# Patient Record
Sex: Female | Born: 1955 | Race: White | Hispanic: No | Marital: Married | Smoking: Former smoker
Health system: Southern US, Community
[De-identification: ages and names within clinical notes are randomized; demographics above are authoritative.]

## PROBLEM LIST (undated history)

## (undated) DIAGNOSIS — Z789 Other specified health status: Secondary | ICD-10-CM

## (undated) DIAGNOSIS — E042 Nontoxic multinodular goiter: Secondary | ICD-10-CM

## (undated) DIAGNOSIS — R011 Cardiac murmur, unspecified: Secondary | ICD-10-CM

## (undated) DIAGNOSIS — R21 Rash and other nonspecific skin eruption: Secondary | ICD-10-CM

## (undated) DIAGNOSIS — Z5189 Encounter for other specified aftercare: Secondary | ICD-10-CM

## (undated) DIAGNOSIS — R002 Palpitations: Secondary | ICD-10-CM

## (undated) DIAGNOSIS — M3500 Sicca syndrome, unspecified: Secondary | ICD-10-CM

## (undated) DIAGNOSIS — K5720 Diverticulitis of large intestine with perforation and abscess without bleeding: Secondary | ICD-10-CM

## (undated) DIAGNOSIS — M8580 Other specified disorders of bone density and structure, unspecified site: Secondary | ICD-10-CM

## (undated) DIAGNOSIS — M1990 Unspecified osteoarthritis, unspecified site: Secondary | ICD-10-CM

## (undated) DIAGNOSIS — Z86010 Personal history of colonic polyps: Secondary | ICD-10-CM

## (undated) DIAGNOSIS — Z933 Colostomy status: Secondary | ICD-10-CM

## (undated) DIAGNOSIS — IMO0001 Reserved for inherently not codable concepts without codable children: Secondary | ICD-10-CM

## (undated) HISTORY — PX: HERNIA REPAIR: SHX51

## (undated) HISTORY — PX: COLON SURGERY: SHX602

## (undated) HISTORY — PX: OTHER SURGICAL HISTORY: SHX169

## (undated) HISTORY — DX: Nontoxic multinodular goiter: E04.2

---
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
## 1998-10-09 ENCOUNTER — Ambulatory Visit (HOSPITAL_COMMUNITY): Admission: RE | Admit: 1998-10-09 | Discharge: 1998-10-09 | Payer: Self-pay | Admitting: Family Medicine

## 1998-10-09 ENCOUNTER — Encounter: Payer: Self-pay | Admitting: Family Medicine

## 1999-12-05 ENCOUNTER — Ambulatory Visit (HOSPITAL_COMMUNITY): Admission: RE | Admit: 1999-12-05 | Discharge: 1999-12-05 | Payer: Self-pay | Admitting: Family Medicine

## 1999-12-05 ENCOUNTER — Encounter: Payer: Self-pay | Admitting: Family Medicine

## 2001-12-13 ENCOUNTER — Encounter: Payer: Self-pay | Admitting: Family Medicine

## 2001-12-13 ENCOUNTER — Ambulatory Visit (HOSPITAL_COMMUNITY): Admission: RE | Admit: 2001-12-13 | Discharge: 2001-12-13 | Payer: Self-pay | Admitting: Family Medicine

## 2002-11-10 ENCOUNTER — Encounter: Payer: Self-pay | Admitting: Family Medicine

## 2002-11-10 ENCOUNTER — Encounter: Admission: RE | Admit: 2002-11-10 | Discharge: 2002-11-10 | Payer: Self-pay | Admitting: Family Medicine

## 2004-02-05 ENCOUNTER — Encounter: Admission: RE | Admit: 2004-02-05 | Discharge: 2004-02-05 | Payer: Self-pay | Admitting: Family Medicine

## 2004-02-28 ENCOUNTER — Other Ambulatory Visit: Admission: RE | Admit: 2004-02-28 | Discharge: 2004-02-28 | Payer: Self-pay | Admitting: Family Medicine

## 2005-02-11 ENCOUNTER — Encounter: Admission: RE | Admit: 2005-02-11 | Discharge: 2005-02-11 | Payer: Self-pay | Admitting: Otolaryngology

## 2005-03-09 ENCOUNTER — Encounter: Admission: RE | Admit: 2005-03-09 | Discharge: 2005-03-09 | Payer: Self-pay | Admitting: Family Medicine

## 2005-03-12 ENCOUNTER — Other Ambulatory Visit: Admission: RE | Admit: 2005-03-12 | Discharge: 2005-03-12 | Payer: Self-pay | Admitting: Family Medicine

## 2006-06-01 LAB — HM SIGMOIDOSCOPY

## 2006-06-16 ENCOUNTER — Ambulatory Visit (HOSPITAL_COMMUNITY): Admission: RE | Admit: 2006-06-16 | Discharge: 2006-06-16 | Payer: Self-pay | Admitting: Family Medicine

## 2006-08-02 ENCOUNTER — Other Ambulatory Visit: Admission: RE | Admit: 2006-08-02 | Discharge: 2006-08-02 | Payer: Self-pay | Admitting: Family Medicine

## 2007-06-02 LAB — HM DEXA SCAN

## 2007-06-21 ENCOUNTER — Ambulatory Visit (HOSPITAL_COMMUNITY): Admission: RE | Admit: 2007-06-21 | Discharge: 2007-06-21 | Payer: Self-pay | Admitting: Family Medicine

## 2007-07-20 ENCOUNTER — Other Ambulatory Visit: Admission: RE | Admit: 2007-07-20 | Discharge: 2007-07-20 | Payer: Self-pay | Admitting: Family Medicine

## 2007-12-16 ENCOUNTER — Encounter: Admission: RE | Admit: 2007-12-16 | Discharge: 2007-12-16 | Payer: Self-pay | Admitting: Family Medicine

## 2008-07-11 IMAGING — MG MM DIGITAL SCREENING BILAT
5 series · 5 of 5 positions shown · non-contrast
Comparison: none

DG SCREEN MAMMOGRAM BILATERAL
Bilateral CC and MLO view(s) were taken.

DIGITAL SCREENING MAMMOGRAM WITH CAD:
The breast tissue is heterogeneously dense.  No masses or malignant type calcifications are 
identified.  Compared with prior studies.

[R CC (1 of 2)]
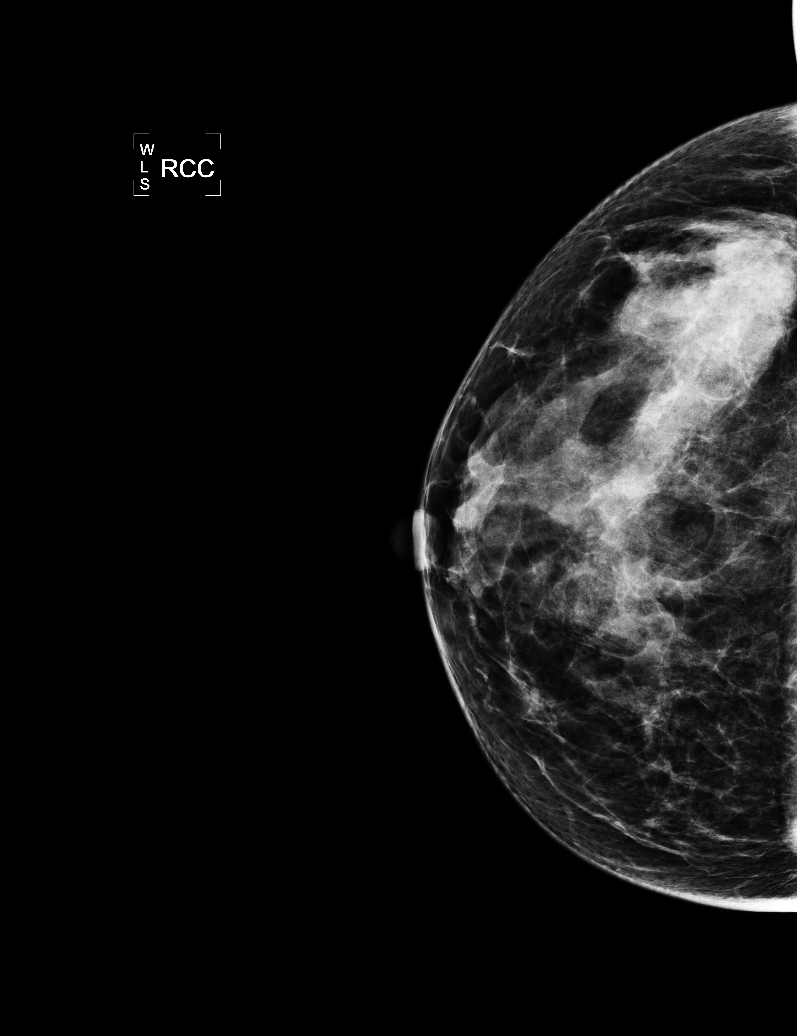

[R MLO]
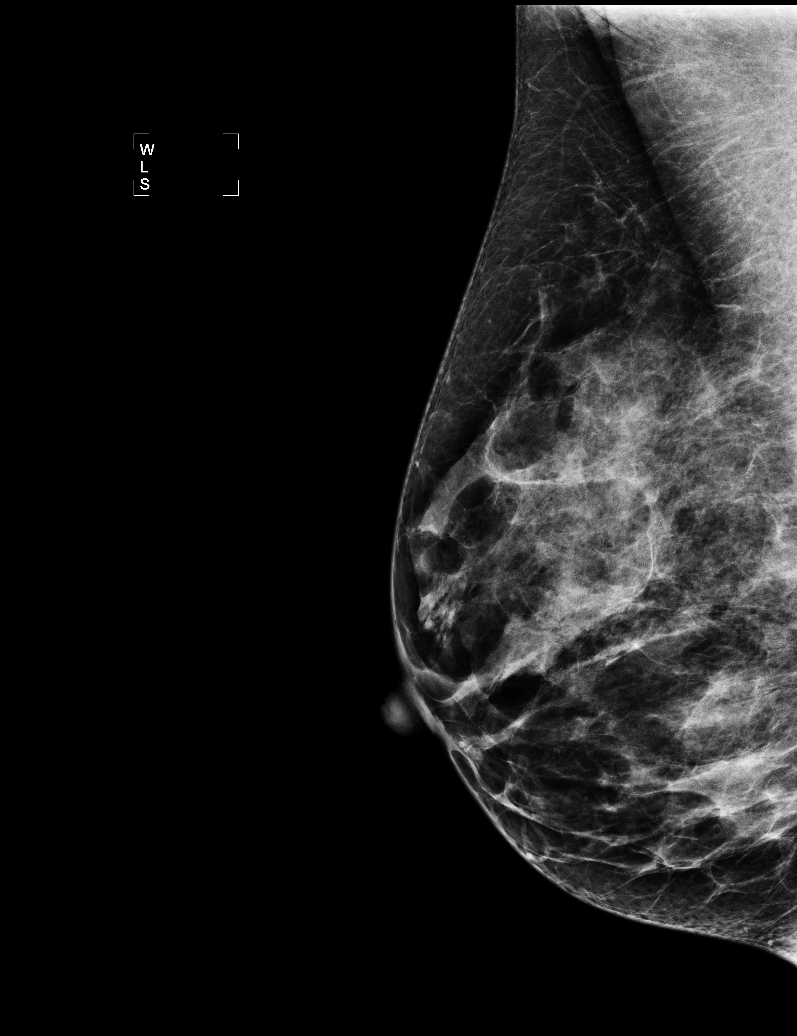

[L CC]
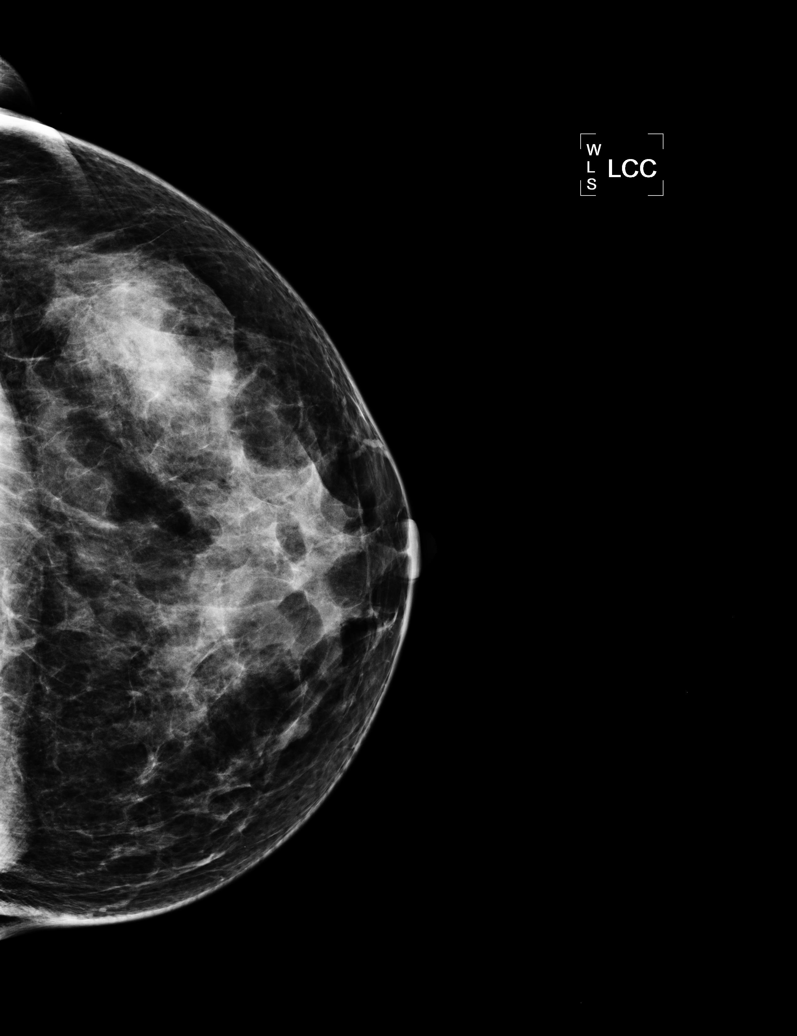

[L MLO]
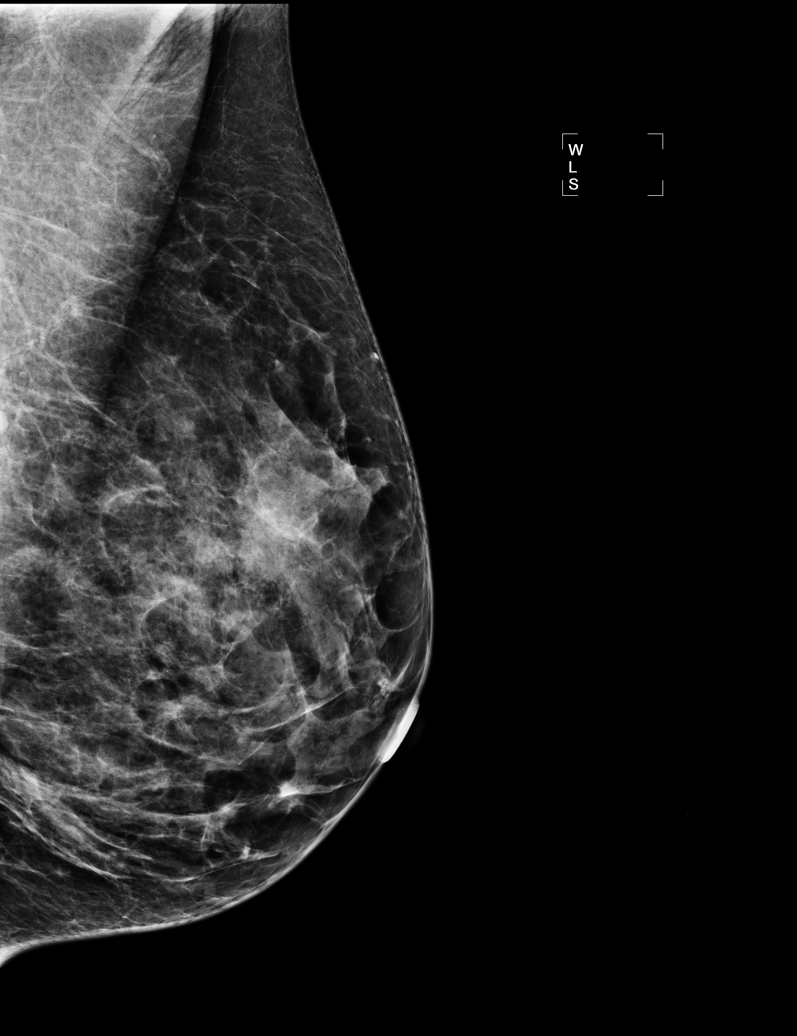

[R CC (2 of 2)]
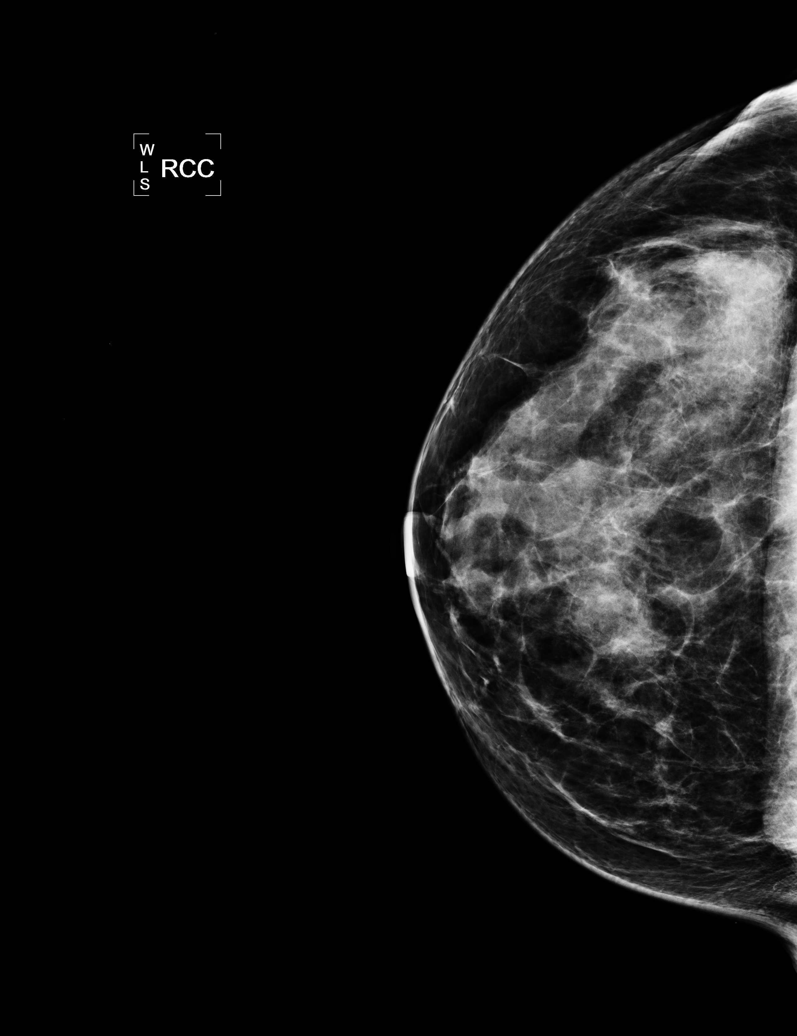

[5 of 5 positions shown; findings below may reference images not displayed]

IMPRESSION: No specific mammographic evidence of malignancy.  Next screening mammogram is recommended in one 
year.

ASSESSMENT: Negative - BI-RADS 1

Screening mammogram in 1 year.
ANALYZED BY COMPUTER AIDED DETECTION. , THIS PROCEDURE WAS A DIGITAL MAMMOGRAM.

## 2008-08-02 ENCOUNTER — Ambulatory Visit (HOSPITAL_COMMUNITY): Admission: RE | Admit: 2008-08-02 | Discharge: 2008-08-02 | Payer: Self-pay | Admitting: Family Medicine

## 2008-08-06 ENCOUNTER — Other Ambulatory Visit: Admission: RE | Admit: 2008-08-06 | Discharge: 2008-08-06 | Payer: Self-pay | Admitting: Family Medicine

## 2009-08-08 ENCOUNTER — Other Ambulatory Visit: Admission: RE | Admit: 2009-08-08 | Discharge: 2009-08-08 | Payer: Self-pay | Admitting: Family Medicine

## 2009-08-22 ENCOUNTER — Ambulatory Visit (HOSPITAL_COMMUNITY): Admission: RE | Admit: 2009-08-22 | Discharge: 2009-08-22 | Payer: Self-pay | Admitting: Family Medicine

## 2009-11-26 ENCOUNTER — Encounter: Admission: RE | Admit: 2009-11-26 | Discharge: 2009-11-26 | Payer: Self-pay | Admitting: Family Medicine

## 2010-08-04 ENCOUNTER — Other Ambulatory Visit (HOSPITAL_COMMUNITY): Payer: Self-pay | Admitting: Family Medicine

## 2010-08-04 DIAGNOSIS — Z1231 Encounter for screening mammogram for malignant neoplasm of breast: Secondary | ICD-10-CM

## 2010-08-14 ENCOUNTER — Other Ambulatory Visit (HOSPITAL_COMMUNITY)
Admission: RE | Admit: 2010-08-14 | Discharge: 2010-08-14 | Disposition: A | Discharge status: Home / Self Care | Payer: BC Managed Care – PPO | Source: Ambulatory Visit | Attending: Family Medicine | Admitting: Family Medicine

## 2010-08-14 ENCOUNTER — Other Ambulatory Visit: Payer: Self-pay | Admitting: Family Medicine

## 2010-08-14 DIAGNOSIS — Z124 Encounter for screening for malignant neoplasm of cervix: Secondary | ICD-10-CM | POA: Insufficient documentation

## 2010-08-14 DIAGNOSIS — Z1159 Encounter for screening for other viral diseases: Secondary | ICD-10-CM | POA: Insufficient documentation

## 2010-08-25 ENCOUNTER — Ambulatory Visit (HOSPITAL_COMMUNITY)
Admission: RE | Admit: 2010-08-25 | Discharge: 2010-08-25 | Disposition: A | Discharge status: Home / Self Care | Payer: BC Managed Care – PPO | Source: Ambulatory Visit | Attending: Family Medicine | Admitting: Family Medicine

## 2010-08-25 DIAGNOSIS — Z1231 Encounter for screening mammogram for malignant neoplasm of breast: Secondary | ICD-10-CM | POA: Insufficient documentation

## 2011-04-02 DIAGNOSIS — Z933 Colostomy status: Secondary | ICD-10-CM

## 2011-04-02 HISTORY — DX: Colostomy status: Z93.3

## 2011-04-16 ENCOUNTER — Inpatient Hospital Stay (HOSPITAL_COMMUNITY)
Admission: EM | Admit: 2011-04-16 | Discharge: 2011-04-21 | Disposition: A | Discharge status: Home / Self Care | Payer: BC Managed Care – PPO | Source: Home / Self Care

## 2011-04-16 ENCOUNTER — Emergency Department (INDEPENDENT_AMBULATORY_CARE_PROVIDER_SITE_OTHER): Payer: BC Managed Care – PPO

## 2011-04-16 ENCOUNTER — Encounter: Payer: Self-pay | Admitting: *Deleted

## 2011-04-16 DIAGNOSIS — J45909 Unspecified asthma, uncomplicated: Secondary | ICD-10-CM

## 2011-04-16 DIAGNOSIS — K5732 Diverticulitis of large intestine without perforation or abscess without bleeding: Secondary | ICD-10-CM | POA: Diagnosis present

## 2011-04-16 DIAGNOSIS — Z860100 Personal history of colon polyps, unspecified: Secondary | ICD-10-CM

## 2011-04-16 DIAGNOSIS — K668 Other specified disorders of peritoneum: Secondary | ICD-10-CM

## 2011-04-16 DIAGNOSIS — K5730 Diverticulosis of large intestine without perforation or abscess without bleeding: Secondary | ICD-10-CM

## 2011-04-16 DIAGNOSIS — M3500 Sicca syndrome, unspecified: Secondary | ICD-10-CM | POA: Diagnosis present

## 2011-04-16 DIAGNOSIS — R109 Unspecified abdominal pain: Secondary | ICD-10-CM

## 2011-04-16 DIAGNOSIS — K630 Abscess of intestine: Secondary | ICD-10-CM | POA: Diagnosis present

## 2011-04-16 HISTORY — DX: Cardiac murmur, unspecified: R01.1

## 2011-04-16 LAB — CBC
Hemoglobin: 13.2 g/dL (ref 12.0–15.0)
MCV: 91.5 fL (ref 78.0–100.0)
Platelets: 232 10*3/uL (ref 150–400)
RDW: 12.9 % (ref 11.5–15.5)

## 2011-04-16 LAB — COMPREHENSIVE METABOLIC PANEL
ALT: 7 U/L (ref 0–35)
Alkaline Phosphatase: 40 U/L (ref 39–117)
GFR calc Af Amer: >90 mL/min (ref >90)
GFR calc non Af Amer: >90 mL/min (ref >90)
Glucose, Bld: 115 mg/dL — ABNORMAL HIGH (ref 70–99)

## 2011-04-16 LAB — URINALYSIS, ROUTINE W REFLEX MICROSCOPIC
Bilirubin Urine: NEGATIVE
Ketones, ur: 15 mg/dL — AB
Leukocytes, UA: NEGATIVE
Nitrite: NEGATIVE
Protein, ur: NEGATIVE mg/dL
Specific Gravity, Urine: 1.011 (ref 1.005–1.030)
Urobilinogen, UA: 0.2 mg/dL (ref 0.0–1.0)

## 2011-04-16 LAB — LIPASE, BLOOD: Lipase: 22 U/L (ref 11–59)

## 2011-04-16 LAB — URINE MICROSCOPIC-ADD ON

## 2011-04-16 MED ORDER — HYDROMORPHONE HCL PF 1 MG/ML IJ SOLN
1.0000 mg | Freq: Once | INTRAMUSCULAR | Status: AC
  Administered 2011-04-16: 1 mg via INTRAVENOUS
  Filled 2011-04-16: qty 1

## 2011-04-16 MED ORDER — ONDANSETRON HCL 4 MG/2ML IJ SOLN
4.0000 mg | Freq: Once | INTRAMUSCULAR | Status: AC
  Administered 2011-04-16: 4 mg via INTRAVENOUS
  Filled 2011-04-16: qty 2

## 2011-04-16 MED ORDER — IOHEXOL 300 MG/ML  SOLN
100.0000 mL | Freq: Once | INTRAMUSCULAR | Status: AC | PRN
  Administered 2011-04-16: 100 mL via INTRAVENOUS

## 2011-04-16 NOTE — ED Provider Notes (Addendum)
History     CSN: [id number] Arrival date & time: 04/16/2011 10:21 PM   First MD Initiated Contact with Patient 04/16/11 2232      Chief Complaint  Patient presents with  . Abdominal Pain    (Consider location/radiation/quality/duration/timing/severity/associated sxs/prior treatment) Patient is a 55 y.o. female presenting with abdominal pain. The history is provided by the patient and the spouse.  Abdominal Pain The primary symptoms of the illness include abdominal pain.   patient here with abdominal pain described as diffuse initially and now worse in her right lower quadrant. Symptoms started 2 days ago described as sharp and stabbing. Worse with movement better with rest. Was seen by her primary care physician today had blood work performed which showed an elevated white blood cell count. Patient seen here for further evaluation. Denies any urinary symptoms. Patient similar symptoms several weeks ago resolved spontaneously. Denies any current vaginal bleeding or abnormal discharge  History reviewed. No pertinent past medical history.  Past Surgical History  Procedure Date  . Hernia repair     History reviewed. No pertinent family history.  History  Substance Use Topics  . Smoking status: Never Smoker   . Smokeless tobacco: Not on file  . Alcohol Use: No    OB History    Grav Para Term Preterm Abortions TAB SAB Ect Mult Living                  Review of Systems  Gastrointestinal: Positive for abdominal pain.  All other systems reviewed and are negative.    Allergies  Codeine  Home Medications   Current Outpatient Rx  Name Route Sig Dispense Refill  . VITAMIN D 2000 UNITS PO TABS Oral Take 2,000 Units by mouth daily.      . CYCLOSPORINE 0.05 % OP EMUL Both Eyes Place 1 drop into both eyes 2 (two) times daily.      [name] FLUTICASONE PROPIONATE 50 MCG/ACT NA SUSP Nasal Place 1 spray into the nose daily.      Carma Leaven M PLUS PO TABS Oral Take 1 tablet by mouth daily.       [name] NAPROXEN SODIUM 220 MG PO TABS Oral Take 220-440 mg by mouth once as needed. For pain       BP 108/87  Pulse 101  Temp(Src) 99.9 F (37.7 C) (Oral)  Resp 16  Ht 5\' 7"  (1.702 m)  Wt 146 lb (66.225 kg)  BMI 22.87 kg/m2  SpO2 99%  LMP 03/23/2011  Physical Exam  Nursing note and vitals reviewed. Constitutional: She is oriented to person, place, and time. She appears well-developed and well-nourished.  Non-toxic appearance. No distress.  HENT:  Head: Normocephalic and atraumatic.  Eyes: Conjunctivae and EOM are normal. Pupils are equal, round, and reactive to light.  Neck: Normal range of motion. Neck supple. No tracheal deviation present.  Cardiovascular: Regular rhythm and normal heart sounds.  Tachycardia present.  Exam reveals no gallop.   No murmur heard. Pulmonary/Chest: Effort normal and breath sounds normal. No stridor. No respiratory distress. She has no wheezes.  Abdominal: Soft. Normal appearance and bowel sounds are normal. She exhibits no distension. There is tenderness in the right lower quadrant. There is no rigidity, no rebound, no guarding and no CVA tenderness. No hernia.  Musculoskeletal: Normal range of motion. She exhibits no edema and no tenderness.  Neurological: She is alert and oriented to person, place, and time. She has normal strength. No cranial nerve deficit or sensory deficit. GCS  eye subscore is 4. GCS verbal subscore is 5. GCS motor subscore is 6.  Skin: Skin is warm and dry.  Psychiatric: She has a normal mood and affect. Her speech is normal and behavior is normal.    ED Course  Procedures (including critical care time)  Labs Reviewed  URINALYSIS, ROUTINE W REFLEX MICROSCOPIC - Abnormal; Notable for the following:    Hgb urine dipstick TRACE (*)    Ketones, ur 15 (*)    All other components within normal limits  CBC - Abnormal; Notable for the following:    WBC 14.1 (*)    All other components within normal limits  URINE MICROSCOPIC-ADD  ON  COMPREHENSIVE METABOLIC PANEL  LIPASE, BLOOD   No results found.   No diagnosis found.    MDM  Patient to be given IV fluids and pain medications here. Patient signed out to Dr. Norlene Campbell for disposition of patient        Toy Baker, MD 04/16/11 2245  11:51 PM Results for orders placed during the hospital encounter of 04/16/11  URINALYSIS, ROUTINE W REFLEX MICROSCOPIC      Component Value Range   Color, Urine YELLOW  YELLOW    Appearance CLEAR  CLEAR    Specific Gravity, Urine 1.011  1.005 - 1.030    pH 7.0  5.0 - 8.0    Glucose, UA NEGATIVE  NEGATIVE (mg/dL)   Hgb urine dipstick TRACE (*) NEGATIVE    Bilirubin Urine NEGATIVE  NEGATIVE    Ketones, ur 15 (*) NEGATIVE (mg/dL)   Protein, ur NEGATIVE  NEGATIVE (mg/dL)   Urobilinogen, UA 0.2  0.0 - 1.0 (mg/dL)   Nitrite NEGATIVE  NEGATIVE    Leukocytes, UA NEGATIVE  NEGATIVE   CBC      Component Value Range   WBC 14.1 (*) 4.0 - 10.5 (K/uL)   RBC 4.26  3.87 - 5.11 (MIL/uL)   Hemoglobin 13.2  12.0 - 15.0 (g/dL)   HCT 62.1  30.8 - 65.7 (%)   MCV 91.5  78.0 - 100.0 (fL)   MCH 31.0  26.0 - 34.0 (pg)   MCHC 33.8  30.0 - 36.0 (g/dL)   RDW 84.6  96.2 - 95.2 (%)   Platelets 232  150 - 400 (K/uL)  COMPREHENSIVE METABOLIC PANEL      Component Value Range   Sodium 138  135 - 145 (mEq/L)   Potassium 4.0  3.5 - 5.1 (mEq/L)   Chloride 102  96 - 112 (mEq/L)   CO2 25  19 - 32 (mEq/L)   Glucose, Bld 115 (*) 70 - 99 (mg/dL)   BUN 10  6 - 23 (mg/dL)   Creatinine, Ser 8.41  0.50 - 1.10 (mg/dL)   Calcium 9.8  8.4 - 32.4 (mg/dL)   Total Protein 8.0  6.0 - 8.3 (g/dL)   Albumin 3.9  3.5 - 5.2 (g/dL)   AST 11  0 - 37 (U/L)   ALT 7  0 - 35 (U/L)   Alkaline Phosphatase 40  39 - 117 (U/L)   Total Bilirubin 0.8  0.3 - 1.2 (mg/dL)   GFR calc non Af Amer >90  >90 (mL/min)   GFR calc Af Amer >90  >90 (mL/min)  URINE MICROSCOPIC-ADD ON      Component Value Range   Squamous Epithelial / LPF RARE  RARE    RBC / HPF 0-2  <3  (RBC/hpf)   Bacteria, UA RARE  RARE   LIPASE, BLOOD  Component Value Range   Lipase 22  11 - 59 (U/L)   Ct Abdomen Pelvis W Contrast  04/16/2011  *RADIOLOGY REPORT*  Clinical Data: Mid abdominal pain.  CT ABDOMEN AND PELVIS WITH CONTRAST  Technique:  Multidetector CT imaging of the abdomen and pelvis was performed following the standard protocol during bolus administration of intravenous contrast.  Contrast: OMNIPAQUE IOHEXOL 300 MG/ML IV SOLN  Comparison: None.  Findings: Minimal bibasilar atelectasis is seen.  A significant amount of free air is noted tracking along the diaphragm, with scattered foci of free air noted about the liver, and small and large bowel loops throughout the abdomen and pelvis.  The location of the patient's bowel perforation is not well characterized.  There is soft tissue inflammation at the right lower quadrant, with inflammatory change involving the distal ileum and the mid sigmoid colon.  More mild diffuse mesenteric haziness is noted.  There is no evidence for appendicitis; the appendix remains normal in caliber.  A 1.1 cm hypodensity along the periphery of the hepatic dome may reflect a small cyst, but is difficult to fully characterize.  The liver is otherwise unremarkable in appearance.  The spleen is within normal limits.  The gallbladder is normal in appearance. The pancreas and adrenal glands are unremarkable.  A tiny 0.7 cm hypodensity along the periphery of the right kidney may reflect a small cyst.  The kidneys are otherwise unremarkable in appearance.  There is incomplete rotation of the right kidney. There is no evidence of hydronephrosis.  No renal or ureteral stones are seen.  No free fluid is identified.  Aside from the soft tissue inflammation about the distal ileum, the small bowel is grossly unremarkable.  The stomach is within normal limits.  No acute vascular abnormalities are seen.  Scattered diverticulosis is noted along the sigmoid colon, without  evidence of diverticulitis.  Aside from the inflammation along the mid sigmoid colon, the colon is unremarkable in appearance.  The bladder is mildly distended; mild soft tissue inflammation about the bladder is nonspecific in appearance.  The uterus is mildly prominent, with suspicion for fibroids. The ovaries are relatively symmetric; no suspicious adnexal masses are seen.  No inguinal lymphadenopathy is seen.  No acute osseous abnormalities are identified.  A large bone island is noted within vertebral body L5.  IMPRESSION:  1.  Significant amount of free air noted tracking along the diaphragm, with scattered foci of free air about the liver, and about small and large bowel loops throughout the abdomen and pelvis. 2.  The location of the bowel perforation is not well characterized; soft tissue inflammation noted at the right lower quadrant, with mild inflammatory change involving the distal ileum and mid sigmoid colon.  No free fluid seen. 3.  Likely small hepatic and renal cysts noted. 4.  Diverticulosis noted along the sigmoid colon, without evidence of diverticulitis. 5.  Likely fibroid uterus noted.  Critical Value/emergent results were called by telephone at the time of interpretation on 04/16/2011  at 11:45 p.m.  to  Dr. Marisa Severin, who verbally acknowledged these results.  Original Report Authenticated By: Tonia Ghent, M.D.     CT scan with free air around distall ileum.  Patient re-evaluated.  Awaiting call back from surgeon.  Olivia Mackie, MD 04/17/11 [phone]

## 2011-04-16 NOTE — ED Notes (Signed)
Pt c/o  Lower abd pain , denies n/v. Sent here by PMD for elevated wc.  Sent here for ct scan

## 2011-04-17 ENCOUNTER — Encounter (HOSPITAL_COMMUNITY): Payer: Self-pay | Admitting: *Deleted

## 2011-04-17 DIAGNOSIS — J45909 Unspecified asthma, uncomplicated: Secondary | ICD-10-CM

## 2011-04-17 DIAGNOSIS — M3500 Sicca syndrome, unspecified: Secondary | ICD-10-CM

## 2011-04-17 LAB — CBC
HCT: 34.3 % — ABNORMAL LOW (ref 36.0–46.0)
MCH: 31.8 pg (ref 26.0–34.0)
MCV: 91 fL (ref 78.0–100.0)
RDW: 13 % (ref 11.5–15.5)
WBC: 10.4 10*3/uL (ref 4.0–10.5)

## 2011-04-17 LAB — DIFFERENTIAL
Basophils Absolute: 0 10*3/uL (ref 0.0–0.1)
Eosinophils Absolute: 0.1 10*3/uL (ref 0.0–0.7)
Eosinophils Relative: 1 % (ref 0–5)
Lymphocytes Relative: 10 % — ABNORMAL LOW (ref 12–46)
Monocytes Absolute: 0.6 10*3/uL (ref 0.1–1.0)

## 2011-04-17 LAB — BASIC METABOLIC PANEL
CO2: 23 mEq/L (ref 19–32)
Calcium: 8.9 mg/dL (ref 8.4–10.5)
Creatinine, Ser: 0.81 mg/dL (ref 0.50–1.10)
GFR calc non Af Amer: 81 mL/min — ABNORMAL LOW (ref >90)
Glucose, Bld: 136 mg/dL — ABNORMAL HIGH (ref 70–99)

## 2011-04-17 MED ORDER — MORPHINE SULFATE 2 MG/ML IJ SOLN
1.0000 mg | INTRAMUSCULAR | Status: DC | PRN
  Administered 2011-04-17 (×2): 2 mg via INTRAVENOUS
  Administered 2011-04-17: 3 mg via INTRAVENOUS
  Administered 2011-04-18: 2 mg via INTRAVENOUS
  Filled 2011-04-17: qty 1
  Filled 2011-04-17: qty 2
  Filled 2011-04-17 (×2): qty 1

## 2011-04-17 MED ORDER — ONDANSETRON HCL 4 MG/2ML IJ SOLN
4.0000 mg | Freq: Four times a day (QID) | INTRAMUSCULAR | Status: DC | PRN
  Administered 2011-04-17: 4 mg via INTRAVENOUS
  Filled 2011-04-17: qty 2

## 2011-04-17 MED ORDER — KCL IN DEXTROSE-NACL 20-5-0.45 MEQ/L-%-% IV SOLN: Freq: Once | INTRAVENOUS | Status: DC

## 2011-04-17 MED ORDER — PIPERACILLIN-TAZOBACTAM 3.375 G IVPB
3.3750 g | Freq: Three times a day (TID) | INTRAVENOUS | Status: DC
  Administered 2011-04-18 – 2011-04-21 (×11): 3.375 g via INTRAVENOUS
  Filled 2011-04-17 (×15): qty 50

## 2011-04-17 MED ORDER — KCL IN DEXTROSE-NACL 20-5-0.45 MEQ/L-%-% IV SOLN
Freq: Once | INTRAVENOUS | Status: AC
  Administered 2011-04-17: 03:00:00 via INTRAVENOUS
  Filled 2011-04-17: qty 1000

## 2011-04-17 MED ORDER — PIPERACILLIN-TAZOBACTAM 3.375 G IVPB 30 MIN
3.3750 g | INTRAVENOUS | Status: AC
  Administered 2011-04-17: 3.375 g via INTRAVENOUS
  Filled 2011-04-17: qty 50

## 2011-04-17 MED ORDER — KCL IN DEXTROSE-NACL 20-5-0.45 MEQ/L-%-% IV SOLN
INTRAVENOUS | Status: AC
  Administered 2011-04-17: 1000 mL via INTRAVENOUS
  Administered 2011-04-17: 20:00:00 via INTRAVENOUS
  Administered 2011-04-17: 1000 mL via INTRAVENOUS
  Administered 2011-04-18 (×2): via INTRAVENOUS
  Administered 2011-04-18: 1000 mL via INTRAVENOUS
  Filled 2011-04-17 (×9): qty 1000

## 2011-04-17 MED ORDER — PIPERACILLIN-TAZOBACTAM 3.375 G IVPB: 3.3750 g | Freq: Four times a day (QID) | INTRAVENOUS | Status: DC

## 2011-04-17 NOTE — Progress Notes (Signed)
Patient seen and I agree with management plan.  Patient's pain may be better this am.  She understands and accepts treatment strategy.

## 2011-04-17 NOTE — ED Notes (Signed)
CALLED TO GIVE REPORT NURSE UNAVAILABLE WILL CALL BACK.  

## 2011-04-17 NOTE — Progress Notes (Signed)
Subjective: Still having some discomfort, better with pain meds.    Objective: Vital signs in last 24 hours: Temp:  [98.9 F (37.2 C)-99.9 F (37.7 C)] 99.8 F (37.7 C) (11/16 0404) Pulse Rate:  [78-101] 96  (11/16 0404) Resp:  [16] 16  (11/16 0404) BP: (99-116)/(55-87) 107/66 mmHg (11/16 0404) SpO2:  [96 %-99 %] 98 % (11/16 0404) Weight:  [66.225 kg (146 lb)] 146 lb (66.225 kg) (11/16 0404) Last BM Date: 04/15/11  Intake/Output from previous day: 11/15 0701 - 11/16 0700 In: 150 [I.V.:150] Out: -  Intake/Output this shift:    General appearance: alert, cooperative and no distress GI: Abd soft, few bowel sound, not distended.   Lab Results:   [facility] 04/17/11 0620 04/16/11 2222  WBC 10.4 14.1*  HGB 12.0 13.2  HCT 34.3* 39.0  PLT 193 232    BMET  Basename 04/17/11 0620 04/16/11 2222  NA 133* 138  K 3.5 4.0  CL 101 102  CO2 23 25  GLUCOSE 136* 115*  BUN 10 10  CREATININE 0.81 0.70  CALCIUM 8.9 9.8   PT/INR No results found for this basename: LABPROT:2,INR:2 in the last 72 hours   Studies/Results: Ct Abdomen Pelvis W Contrast  04/17/2011  **ADDENDUM** CREATED: 04/17/2011 04:53:47  On further review, the degree of soft tissue inflammation about the mid sigmoid colon may reflect acute diverticulitis.  On coronal images, there is question of minimal air tracking from the mid sigmoid colon (image 28 of 70), and this could possibly reflect the site of perforation, though the amount of free intra-abdominal air is unusual for a ruptured diverticulitis.  Surrounding soft tissue inflammation is noted at the right lower quadrant.  The lack of free fluid suggests against an ileal rupture, while the lack of contrast extravasation suggests against a bowel perforation in the proximal small bowel.  **END ADDENDUM** SIGNED BY: Tonia Ghent, M.D.   04/17/2011  *RADIOLOGY REPORT*  Clinical Data: Mid abdominal pain.  CT ABDOMEN AND PELVIS WITH CONTRAST  Technique:   Multidetector CT imaging of the abdomen and pelvis was performed following the standard protocol during bolus administration of intravenous contrast.  Contrast: OMNIPAQUE IOHEXOL 300 MG/ML IV SOLN  Comparison: None.  Findings: Minimal bibasilar atelectasis is seen.  A significant amount of free air is noted tracking along the diaphragm, with scattered foci of free air noted about the liver, and small and large bowel loops throughout the abdomen and pelvis.  The location of the patient's bowel perforation is not well characterized.  There is soft tissue inflammation at the right lower quadrant, with inflammatory change involving the distal ileum and the mid sigmoid colon.  More mild diffuse mesenteric haziness is noted.  There is no evidence for appendicitis; the appendix remains normal in caliber.  A 1.1 cm hypodensity along the periphery of the hepatic dome may reflect a small cyst, but is difficult to fully characterize.  The liver is otherwise unremarkable in appearance.  The spleen is within normal limits.  The gallbladder is normal in appearance. The pancreas and adrenal glands are unremarkable.  A tiny 0.7 cm hypodensity along the periphery of the right kidney may reflect a small cyst.  The kidneys are otherwise unremarkable in appearance.  There is incomplete rotation of the right kidney. There is no evidence of hydronephrosis.  No renal or ureteral stones are seen.  No free fluid is identified.  Aside from the soft tissue inflammation about the distal ileum, the small bowel is grossly  unremarkable.  The stomach is within normal limits.  No acute vascular abnormalities are seen.  Scattered diverticulosis is noted along the sigmoid colon, without evidence of diverticulitis.  Aside from the inflammation along the mid sigmoid colon, the colon is unremarkable in appearance.  The bladder is mildly distended; mild soft tissue inflammation about the bladder is nonspecific in appearance.  The uterus is mildly  prominent, with suspicion for fibroids. The ovaries are relatively symmetric; no suspicious adnexal masses are seen.  No inguinal lymphadenopathy is seen.  No acute osseous abnormalities are identified.  A large bone island is noted within vertebral body L5.  IMPRESSION:  1.  Significant amount of free air noted tracking along the diaphragm, with scattered foci of free air about the liver, and about small and large bowel loops throughout the abdomen and pelvis. 2.  The location of the bowel perforation is not well characterized; soft tissue inflammation noted at the right lower quadrant, with mild inflammatory change involving the distal ileum and mid sigmoid colon.  No free fluid seen. 3.  Likely small hepatic and renal cysts noted. 4.  Diverticulosis noted along the sigmoid colon, without evidence of diverticulitis. 5.  Likely fibroid uterus noted.  Critical Value/emergent results were called by telephone at the time of interpretation on 04/16/2011  at 11:45 p.m.  to  Dr. Marisa Severin, who verbally acknowledged these results. Original Report Authenticated By: Tonia Ghent, M.D.    Anti-infectives: Anti-infectives     Start     Dose/Rate Route Frequency Ordered Stop   04/17/11 0230   piperacillin-tazobactam (ZOSYN) IVPB 3.375 g  Status:  Discontinued        3.375 g 12.5 mL/hr over 240 Minutes Intravenous 4 times per day 04/17/11 0217 04/17/11 0221   04/17/11 0230  piperacillin-tazobactam (ZOSYN) IVPB 3.375 g       3.375 g 100 mL/hr over 30 Minutes Intravenous To Emergency Dept 04/17/11 0221 04/17/11 0313         Current Facility-Administered Medications  Medication Dose Route Frequency Provider Last Rate Last Dose  . dextrose 5 % and 0.45 % NaCl with KCl 20 mEq/L infusion   Intravenous Once Kandis Cocking, MD 200 mL/hr at 04/17/11 0244    . dextrose 5 % and 0.45 % NaCl with KCl 20 mEq/L infusion   Intravenous Continuous Drema Halon, MD 200 mL/hr at 04/17/11 0545    . HYDROmorphone  (DILAUDID) injection 1 mg  1 mg Intravenous Once Toy Baker, MD   1 mg at 04/16/11 2317  . iohexol (OMNIPAQUE) 300 MG/ML injection 100 mL  100 mL Intravenous Once PRN Medication Radiologist   100 mL at 04/16/11 2338  . morphine 2 MG/ML injection 1-3 mg  1-3 mg Intravenous Q2H PRN Kandis Cocking, MD   3 mg at 04/17/11 0251  . ondansetron (ZOFRAN) injection 4 mg  4 mg Intravenous Once Toy Baker, MD   4 mg at 04/16/11 2317  . ondansetron (ZOFRAN) injection 4 mg  4 mg Intravenous Q6H PRN Kandis Cocking, MD   4 mg at 04/17/11 0252  . piperacillin-tazobactam (ZOSYN) IVPB 3.375 g  3.375 g Intravenous To ER Jacquenette Shone Crowford Darlina Guys., PHARMD   3.375 g at 04/17/11 0243  . DISCONTD: dextrose 5 % and 0.45 % NaCl with KCl 20 mEq/L infusion   Intravenous Once Drema Halon, MD      . DISCONTD: piperacillin-tazobactam (ZOSYN) IVPB 3.375 g  3.375 g Intravenous Q6H Kandis Cocking,  MD        Assessment/Plan 1.Free Air At the diaphragm, diverticulosis, but not diverticulitis.  2Probable diverticular perforation. Patient Active Problem List  Diagnoses  . Sjoegren syndrome  . Asthma   3.There is no problem list on file for this patient. Plan: On antibiotics, Dr. Daphine Deutscher to review studies, and will see shortly.   LOS: 1 day    Kanon Novosel 04/17/2011

## 2011-04-17 NOTE — ED Notes (Signed)
EAV:[id number]> Expected date:04/17/11<BR> Expected time: 1:09 AM<BR> Means of arrival:Ambulance<BR> Comments:<BR> abd pain, surgery to consult

## 2011-04-17 NOTE — Progress Notes (Signed)
Chart reviewed and UR completed. 

## 2011-04-17 NOTE — ED Notes (Signed)
Report given to [employer] charge at [facility]

## 2011-04-17 NOTE — H&P (Addendum)
[facility]  Ovidio Kin, MD,  FACS [address]  [address] [place], [place]    [address] Phone:  [phone] FAX:  [phone]  ASSESSMENT AND PLAN: 1.  Pneumoperitoneum  Probable diverticular source.  On CT scan, there are some inflammatory changes in her mid sigmoid colon.  Patient does not look sick enough or tender enough to have a gastro-duodenal perf.  I have two operations to go.    Will admit, place on antibiotics, keep NPO, give IVF and re-evaluate in 2 to 4 hours.  Plan serial exams.  I discussed with the patient and her husband about the options for medical treatment vs possible surgery.  2.  Sjogren's syndrome, mild.  She did see Dr. Oleta Gentry. 3.  Asthma, mild 4.  Irregular periods, on BCP. 5.  Remote history of right thigh myxoma. 6.  Diverticulosis on CT. 7.  Probable uterine fibroids.   Chief Complaint  Patient presents with  . Abdominal Pain   REFERRING PHYSICIAN:   Patient sent from [facility] via [facility].  HISTORY OF PRESENT ILLNESS: Annette Gentry is a 55 y.o. (DOB: 04/22/1956)  white female whose primary care physician is Dr. Rulon Abide  (saw Dr. Laurine Blazer today) and comes to me today for pneumoperitoneum.  Had abdominal pain which began yesterday.  She had a similar episode 7 weeks ago which lasted 3 days, but was not as bad, and she related it to her period.  She had increasing abdominal pain over the last 24 hours, no nausea or vomiting.  She had a colonoscopy in 2009 which was negative, except she thinks she remembers diverticula being mentioned.    GI history:  No history of stomach disease.  No history of liver disease.  No history of gall bladder disease.  No history of pancreas disease.  No history of colon disease   History reviewed. No pertinent past medical history.    Past Surgical History  Procedure Date  . Hernia repair       Current Facility-Administered Medications  Medication Dose Route  Frequency Provider Last Rate Last Dose  . HYDROmorphone (DILAUDID) injection 1 mg  1 mg Intravenous Once Toy Baker, MD   1 mg at 04/16/11 2317  . iohexol (OMNIPAQUE) 300 MG/ML injection 100 mL  100 mL Intravenous Once PRN Medication Radiologist   100 mL at 04/16/11 2338  . ondansetron (ZOFRAN) injection 4 mg  4 mg Intravenous Once Toy Baker, MD   4 mg at 04/16/11 2317   Current Outpatient Prescriptions  Medication Sig Dispense Refill  . Cholecalciferol (VITAMIN D) 2000 UNITS tablet Take 2,000 Units by mouth daily.        . cycloSPORINE (RESTASIS) 0.05 % ophthalmic emulsion Place 1 drop into both eyes 2 (two) times daily.        . fluticasone (FLONASE) 50 MCG/ACT nasal spray Place 1 spray into the nose daily.        . Multiple Vitamins-Minerals (MULTIVITAMINS THER. W/MINERALS) TABS Take 1 tablet by mouth daily.        . naproxen sodium (ANAPROX) 220 MG tablet Take 220-440 mg by mouth once as needed. For pain           Allergies  Allergen Reactions  . Codeine Nausea Only    REVIEW OF SYSTEMS: Skin:  No history of rash.  No history of abnormal moles. Infection:  No history of hepatitis or HIV.  No history of MRSA. Neurologic:  No history  of stroke.  No history of seizure.  No history of headaches. Cardiac:  No history of hypertension. History of murmur.  Had workup by Dr. Chales Abrahams for palpitations.  Neg eval in 2009. Pulmonary:  Does not smoke cigarettes.  MIld asthma.  No OSA/CPAP.  Endocrine:  No diabetes. No thyroid disease. Gastrointestinal:  Derrell Lolling repaired RIH in 2007.  History of colonscopy in 2009, ? physician. Urologic:  No history of kidney stones.  No history of bladder infections. GYN:  Irregular periods, on BCP. Musculoskeletal:  No history of joint or back disease.  Tumor left thigh at age 57, Right thigh myxoma in 2001.  Diagnosed with Sjogren's syndrome (she says it is a mild case) - her primary complaints are arthralgias, fatigue, dry eyes.  Diagnosed in 2005.   Followed by Dr. Oleta Gentry until his retirement. Hematologic:  No bleeding disorder.  No history of anemia.  Not anticoagulated. Psycho-social:  The patient is oriented.   The patient has no obvious psychologic or social impairment to understanding our conversation and plan.  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 29 YO son. She has a brother who had a perf ulcer!  PHYSICAL EXAM: BP 99/60  Pulse 78  Temp(Src) 99.9 F (37.7 C) (Oral)  Resp 16  Ht 5\' 7"  (1.702 m)  Wt 146 lb (66.225 kg)  BMI 22.87 kg/m2  SpO2 99%  LMP 03/23/2011  General: WN WF who is alert and generally healthy appearing. She is uncomfortable, but not sick looking. HEENT: Normal. Pupils equal. Good dentition. Neck: Supple. No mass.  No thyroid mass.  Carotid pulse okay with no bruit. Lymph Nodes:  No supraclavicular or cervical nodes. Lungs: Clear to auscultation and symmetric breath sounds. Heart:  RRR. 2/6 systolic murmur. Abdomen: Soft. No mass. Moderate, primarily lower abdominal tenderness. Mild guarding, but no rebound.  No hernia. Has bowel sounds.   Rectal: Not done. Extremities:  Good strength and ROM  in upper and lower extremities.  Scar inner left thigh, scar lateral right thigh. Neurologic:  Grossly intact to motor and sensory function. Psychiatric: Has normal mood and affect. Behavior is normal.   DATA REVIEWED: WBC - 14,200  Hgb - 13.2  Gluc -115 CT scan - discussed with Dr. Felipa Evener - prob diverticular source in mid sigmoid colon.  This is a soft call.  She has almost no intra-abdominal fluid.

## 2011-04-17 NOTE — ED Notes (Signed)
PT TRANSFERRED TO FLOOR VIA STRETCHER WITH RN AT BEDSIDE.

## 2011-04-18 NOTE — Progress Notes (Signed)
Subjective: She is feeling better in general. Lower abdominal pain is less. She's passed a little flatus but no stool. Denies nausea or vomiting. Afebrile. Heart rate normal, no tachycardia. No lab work today.  Objective: Vital signs in last 24 hours: Temp:  [98.7 F (37.1 C)-100.3 F (37.9 C)] 98.8 F (37.1 C) (11/17 0455) Pulse Rate:  [83-90] 83  (11/17 0455) Resp:  [18] 18  (11/17 0455) BP: (86-96)/(51-61) 96/61 mmHg (11/17 0455) SpO2:  [96 %-98 %] 97 % (11/17 0455) Last BM Date: 04/15/11  Intake/Output from previous day: 11/16 0701 - 11/17 0700 In: 4863.3 [I.V.:4863.3] Out: 1625 [Urine:1625] Intake/Output this shift:    Resp: clear to auscultation bilaterally GI: abdomen is generally soft and nondistended. Bowel sounds are present. She does have some diffuse lower abdominal tenderness but there is no mass and no peritoneal signs.  Lab Results:   [facility] 04/17/11 0620 04/16/11 2222  WBC 10.4 14.1*  HGB 12.0 13.2  HCT 34.3* 39.0  PLT 193 232   BMET  Basename 04/17/11 0620 04/16/11 2222  NA 133* 138  K 3.5 4.0  CL 101 102  CO2 23 25  GLUCOSE 136* 115*  BUN 10 10  CREATININE 0.81 0.70  CALCIUM 8.9 9.8   PT/INR No results found for this basename: LABPROT:2,INR:2 in the last 72 hours ABG No results found for this basename: PHART:2,PCO2:2,PO2:2,HCO3:2 in the last 72 hours  Studies/Results: Ct Abdomen Pelvis W Contrast  04/17/2011  **ADDENDUM** CREATED: 04/17/2011 04:53:47  On further review, the degree of soft tissue inflammation about the mid sigmoid colon may reflect acute diverticulitis.  On coronal images, there is question of minimal air tracking from the mid sigmoid colon (image 28 of 70), and this could possibly reflect the site of perforation, though the amount of free intra-abdominal air is unusual for a ruptured diverticulitis.  Surrounding soft tissue inflammation is noted at the right lower quadrant.  The lack of free fluid suggests against an ileal  rupture, while the lack of contrast extravasation suggests against a bowel perforation in the proximal small bowel.  **END ADDENDUM** SIGNED BY: Tonia Ghent, M.D.   04/17/2011  *RADIOLOGY REPORT*  Clinical Data: Mid abdominal pain.  CT ABDOMEN AND PELVIS WITH CONTRAST  Technique:  Multidetector CT imaging of the abdomen and pelvis was performed following the standard protocol during bolus administration of intravenous contrast.  Contrast: OMNIPAQUE IOHEXOL 300 MG/ML IV SOLN  Comparison: None.  Findings: Minimal bibasilar atelectasis is seen.  A significant amount of free air is noted tracking along the diaphragm, with scattered foci of free air noted about the liver, and small and large bowel loops throughout the abdomen and pelvis.  The location of the patient's bowel perforation is not well characterized.  There is soft tissue inflammation at the right lower quadrant, with inflammatory change involving the distal ileum and the mid sigmoid colon.  More mild diffuse mesenteric haziness is noted.  There is no evidence for appendicitis; the appendix remains normal in caliber.  A 1.1 cm hypodensity along the periphery of the hepatic dome may reflect a small cyst, but is difficult to fully characterize.  The liver is otherwise unremarkable in appearance.  The spleen is within normal limits.  The gallbladder is normal in appearance. The pancreas and adrenal glands are unremarkable.  A tiny 0.7 cm hypodensity along the periphery of the right kidney may reflect a small cyst.  The kidneys are otherwise unremarkable in appearance.  There is incomplete rotation of  the right kidney. There is no evidence of hydronephrosis.  No renal or ureteral stones are seen.  No free fluid is identified.  Aside from the soft tissue inflammation about the distal ileum, the small bowel is grossly unremarkable.  The stomach is within normal limits.  No acute vascular abnormalities are seen.  Scattered diverticulosis is noted along  the sigmoid colon, without evidence of diverticulitis.  Aside from the inflammation along the mid sigmoid colon, the colon is unremarkable in appearance.  The bladder is mildly distended; mild soft tissue inflammation about the bladder is nonspecific in appearance.  The uterus is mildly prominent, with suspicion for fibroids. The ovaries are relatively symmetric; no suspicious adnexal masses are seen.  No inguinal lymphadenopathy is seen.  No acute osseous abnormalities are identified.  A large bone island is noted within vertebral body L5.  IMPRESSION:  1.  Significant amount of free air noted tracking along the diaphragm, with scattered foci of free air about the liver, and about small and large bowel loops throughout the abdomen and pelvis. 2.  The location of the bowel perforation is not well characterized; soft tissue inflammation noted at the right lower quadrant, with mild inflammatory change involving the distal ileum and mid sigmoid colon.  No free fluid seen. 3.  Likely small hepatic and renal cysts noted. 4.  Diverticulosis noted along the sigmoid colon, without evidence of diverticulitis. 5.  Likely fibroid uterus noted.  Critical Value/emergent results were called by telephone at the time of interpretation on 04/16/2011  at 11:45 p.m.  to  Dr. Marisa Severin, who verbally acknowledged these results. Original Report Authenticated By: Tonia Ghent, M.D.    Anti-infectives: Anti-infectives     Start     Dose/Rate Route Frequency Ordered Stop   04/17/11 2330  piperacillin-tazobactam (ZOSYN) IVPB 3.375 g       3.375 g 12.5 mL/hr over 240 Minutes Intravenous 3 times per day 04/17/11 2317     04/17/11 0230   piperacillin-tazobactam (ZOSYN) IVPB 3.375 g  Status:  Discontinued        3.375 g 12.5 mL/hr over 240 Minutes Intravenous 4 times per day 04/17/11 0217 04/17/11 0221   04/17/11 0230  piperacillin-tazobactam (ZOSYN) IVPB 3.375 g       3.375 g 100 mL/hr over 30 Minutes Intravenous To Emergency  Dept 04/17/11 0221 04/17/11 0313          Assessment/Plan:  hospital day #3.Presumed sigmoid diverticulitis with perforation.   No evidence for peritonitis. Gen. Clinical course is improving. Plan ice chips, but we'll continue bowel rest otherwise. Continue IV antibiotics. Check lab work tomorrow bowel rest until bowel function better. Consider CT next week  LOS: 2 days    Gurtej Noyola M 04/18/2011

## 2011-04-19 LAB — BASIC METABOLIC PANEL
Calcium: 9.2 mg/dL (ref 8.4–10.5)
Creatinine, Ser: 0.94 mg/dL (ref 0.50–1.10)
GFR calc Af Amer: 78 mL/min — ABNORMAL LOW (ref >90)

## 2011-04-19 LAB — CBC
MCH: 30.8 pg (ref 26.0–34.0)
MCV: 91.9 fL (ref 78.0–100.0)
Platelets: 196 10*3/uL (ref 150–400)
RDW: 12.7 % (ref 11.5–15.5)
WBC: 4.9 10*3/uL (ref 4.0–10.5)

## 2011-04-19 MED ORDER — KCL IN DEXTROSE-NACL 20-5-0.45 MEQ/L-%-% IV SOLN
INTRAVENOUS | Status: DC
  Administered 2011-04-19: 1000 mL via INTRAVENOUS
  Administered 2011-04-20 – 2011-04-21 (×2): via INTRAVENOUS
  Filled 2011-04-19 (×7): qty 1000

## 2011-04-19 NOTE — Progress Notes (Signed)
  Subjective: She is slowly feeling better. Passing flatus but still no stool. No nausea. Not really hungry. She relates that she had a colonoscopy by Dr. Dorena Cookey 3 years ago and was seen to have some diverticula.  Objective: Vital signs in last 24 hours: Temp:  [98.5 F (36.9 C)-98.8 F (37.1 C)] 98.8 F (37.1 C) (11/18 0604) Pulse Rate:  [72-85] 73  (11/18 0604) Resp:  [16-18] 16  (11/18 0604) BP: (100-108)/(63-65) 101/63 mmHg (11/18 0604) SpO2:  [96 %-100 %] 96 % (11/18 0604) Last BM Date: 04/15/11  Intake/Output from previous day: 11/17 0701 - 11/18 0700 In: 4026.7 [P.O.:240; I.V.:3786.7] Out: 1175 [Urine:1175] Intake/Output this shift:    Resp: clear to auscultation bilaterally GI: abdomen is soft and not distended. Bowel sounds are present but somewhat hypoactive. A little bit tender subjectively in the lower abdomen, not very well localized, no mass.  Lab Results:   [facility] 04/19/11 0535 04/17/11 0620  WBC 4.9 10.4  HGB 11.4* 12.0  HCT 34.0* 34.3*  PLT 196 193   BMET  Basename 04/19/11 0535 04/17/11 0620  NA 138 133*  K 4.1 3.5  CL 107 101  CO2 24 23  GLUCOSE 119* 136*  BUN 8 10  CREATININE 0.94 0.81  CALCIUM 9.2 8.9   PT/INR No results found for this basename: LABPROT:2,INR:2 in the last 72 hours ABG No results found for this basename: PHART:2,PCO2:2,PO2:2,HCO3:2 in the last 72 hours  Studies/Results: No results found.  Anti-infectives: Anti-infectives     Start     Dose/Rate Route Frequency Ordered Stop   04/17/11 2330  piperacillin-tazobactam (ZOSYN) IVPB 3.375 g       3.375 g 12.5 mL/hr over 240 Minutes Intravenous 3 times per day 04/17/11 2317     04/17/11 0230   piperacillin-tazobactam (ZOSYN) IVPB 3.375 g  Status:  Discontinued        3.375 g 12.5 mL/hr over 240 Minutes Intravenous 4 times per day 04/17/11 0217 04/17/11 0221   04/17/11 0230  piperacillin-tazobactam (ZOSYN) IVPB 3.375 g       3.375 g 100 mL/hr over 30 Minutes  Intravenous To Emergency Dept 04/17/11 0221 04/17/11 0313          Assessment/Plan:  Presumed acute diverticulitis with perforation. There is no radiographic or clinical evidence of abscess. This is resolving clinically.   Return of WBC to normal and absence of fever or encouraging signs.  Will start a full liquid diet.  Decrease IV to 100 cc per hour.  Ambulate the hall.  Consider follow up CT  The patient was told she may need further workup post discharge.  LOS: 3 days    Annette Gentry M 04/19/2011

## 2011-04-20 DIAGNOSIS — K5720 Diverticulitis of large intestine with perforation and abscess without bleeding: Secondary | ICD-10-CM

## 2011-04-20 HISTORY — DX: Diverticulitis of large intestine with perforation and abscess without bleeding: K57.20

## 2011-04-20 MED ORDER — HYDROCODONE-ACETAMINOPHEN 5-325 MG PO TABS: 1.0000 | ORAL_TABLET | ORAL | Status: DC | PRN

## 2011-04-20 NOTE — Progress Notes (Signed)
  Subjective: The patient is feeling better. She is not experiencing any pain. She did have a soft solid bowel movement. She is hungry. No fever or tachycardia. WBC 4900 yesterday.  Objective: Vital signs in last 24 hours: Temp:  [98 F (36.7 C)-98.4 F (36.9 C)] 98.4 F (36.9 C) (11/19 0615) Pulse Rate:  [67-78] 67  (11/19 0615) Resp:  [16] 16  (11/19 0615) BP: (100-116)/(63-73) 100/63 mmHg (11/19 0615) SpO2:  [96 %-98 %] 96 % (11/19 0615) Last BM Date: 04/19/11  Intake/Output from previous day: 11/18 0701 - 11/19 0700 In: 3023.3 [P.O.:720; I.V.:2103.3; IV Piggyback:200] Out: 2726 [Urine:2725; Stool:1] Intake/Output this shift: Total I/O In: 360 [P.O.:360] Out: -   Resp: clear to auscultation bilaterally GI: abdomen is soft. Not distended. Minimal subjective tenderness  mostly in the suprapubic area. Not lateralizing. No mass. No guarding.  Lab Results:   [facility] 04/19/11 0535  WBC 4.9  HGB 11.4*  HCT 34.0*  PLT 196   BMET  Basename 04/19/11 0535  NA 138  K 4.1  CL 107  CO2 24  GLUCOSE 119*  BUN 8  CREATININE 0.94  CALCIUM 9.2   PT/INR No results found for this basename: LABPROT:2,INR:2 in the last 72 hours ABG No results found for this basename: PHART:2,PCO2:2,PO2:2,HCO3:2 in the last 72 hours  Studies/Results: No results found.  Anti-infectives: Anti-infectives     Start     Dose/Rate Route Frequency Ordered Stop   04/17/11 2330  piperacillin-tazobactam (ZOSYN) IVPB 3.375 g       3.375 g 12.5 mL/hr over 240 Minutes Intravenous 3 times per day 04/17/11 2317     04/17/11 0230   piperacillin-tazobactam (ZOSYN) IVPB 3.375 g  Status:  Discontinued        3.375 g 12.5 mL/hr over 240 Minutes Intravenous 4 times per day 04/17/11 0217 04/17/11 0221   04/17/11 0230  piperacillin-tazobactam (ZOSYN) IVPB 3.375 g       3.375 g 100 mL/hr over 30 Minutes Intravenous To Emergency Dept 04/17/11 0221 04/17/11 0313          Assessment/Plan:  Presumed  acute diverticulitis with perforation. There is no clinical evidence of sepsis, abscess, or ongoing inflammation.  Last colonoscopy 3 years ago.  Return of bowel function is encouraging.  Decrease IV to 75 cc per hour.  Advance diet to low-residue.  Discharge home in 1-2 days, consider outpatient CT or barium enema at some point.  LOS: 4 days    Irvan Tiedt M 04/20/2011

## 2011-04-21 ENCOUNTER — Telehealth (INDEPENDENT_AMBULATORY_CARE_PROVIDER_SITE_OTHER): Payer: Self-pay | Admitting: General Surgery

## 2011-04-21 ENCOUNTER — Inpatient Hospital Stay (HOSPITAL_COMMUNITY)
Admission: EM | Admit: 2011-04-21 | Discharge: 2011-04-28 | DRG: 148 | Disposition: A | Discharge status: Home Health | Payer: BC Managed Care – PPO | Attending: Surgery | Admitting: Surgery

## 2011-04-21 ENCOUNTER — Emergency Department (HOSPITAL_COMMUNITY): Payer: BC Managed Care – PPO

## 2011-04-21 DIAGNOSIS — Z860100 Personal history of colon polyps, unspecified: Secondary | ICD-10-CM

## 2011-04-21 DIAGNOSIS — K5720 Diverticulitis of large intestine with perforation and abscess without bleeding: Secondary | ICD-10-CM | POA: Diagnosis present

## 2011-04-21 HISTORY — DX: Diverticulitis of large intestine with perforation and abscess without bleeding: K57.20

## 2011-04-21 HISTORY — DX: Personal history of colonic polyps: Z86.010

## 2011-04-21 LAB — CBC
Platelets: 242 10*3/uL (ref 150–400)
RDW: 12.3 % (ref 11.5–15.5)
WBC: 15.3 10*3/uL — ABNORMAL HIGH (ref 4.0–10.5)

## 2011-04-21 LAB — DIFFERENTIAL
Basophils Absolute: 0 10*3/uL (ref 0.0–0.1)
Lymphocytes Relative: 3 % — ABNORMAL LOW (ref 12–46)
Neutro Abs: 14.1 10*3/uL — ABNORMAL HIGH (ref 1.7–7.7)
Neutrophils Relative %: 93 % — ABNORMAL HIGH (ref 43–77)

## 2011-04-21 MED ORDER — METRONIDAZOLE 500 MG PO TABS: 500.0000 mg | ORAL_TABLET | Freq: Three times a day (TID) | ORAL | Status: DC

## 2011-04-21 MED ORDER — HYDROMORPHONE HCL PF 2 MG/ML IJ SOLN
INTRAMUSCULAR | Status: AC
  Administered 2011-04-21: 1 mg via INTRAVENOUS
  Filled 2011-04-21: qty 1

## 2011-04-21 MED ORDER — HYDROCODONE-ACETAMINOPHEN 5-325 MG PO TABS: 1.0000 | ORAL_TABLET | ORAL | Status: DC | PRN

## 2011-04-21 MED ORDER — ONDANSETRON HCL 4 MG/2ML IJ SOLN
4.0000 mg | Freq: Once | INTRAMUSCULAR | Status: AC
  Administered 2011-04-21: 4 mg via INTRAVENOUS
  Filled 2011-04-21: qty 2

## 2011-04-21 MED ORDER — CIPROFLOXACIN HCL 500 MG PO TABS
500.0000 mg | ORAL_TABLET | Freq: Two times a day (BID) | ORAL | Status: DC
  Administered 2011-04-21: 500 mg via ORAL
  Filled 2011-04-21 (×3): qty 1

## 2011-04-21 MED ORDER — HYDROMORPHONE HCL PF 1 MG/ML IJ SOLN: 1.0000 mg | Freq: Once | INTRAMUSCULAR | Status: DC

## 2011-04-21 MED ORDER — CIPROFLOXACIN HCL 500 MG PO TABS: 500.0000 mg | ORAL_TABLET | Freq: Two times a day (BID) | ORAL | Status: DC

## 2011-04-21 MED ORDER — SODIUM CHLORIDE 0.9 % IV SOLN
999.0000 mL | INTRAVENOUS | Status: DC
  Administered 2011-04-21: 1000 mL via INTRAVENOUS

## 2011-04-21 MED ORDER — METRONIDAZOLE 500 MG PO TABS
500.0000 mg | ORAL_TABLET | Freq: Three times a day (TID) | ORAL | Status: DC
  Administered 2011-04-21: 500 mg via ORAL
  Filled 2011-04-21 (×3): qty 1

## 2011-04-21 NOTE — ED Notes (Signed)
MD at bedside. 

## 2011-04-21 NOTE — Telephone Encounter (Signed)
Patient's husband called saying that she has had increased abdominal pain similar to her admission pain and has had 99.8 temp.  I recommended that she go back to the ER for repeat labs, CT and evaluation.

## 2011-04-21 NOTE — Discharge Summary (Signed)
Physician Discharge Summary  Patient ID: Annette Gentry MRN: [id number] DOB/AGE: 1955-11-25 55 y.o.  Admit date: 04/16/2011 Discharge date: 04/21/2011  Admission Diagnoses: Pneumoperitoneum with probable diverticular source.  Discharge Diagnoses: Same. Active Problems:  Sjoegren syndrome  Asthma   PROCEDURES:  Significant Diagnostic Studies: CT scan 04/16/2011 shows significant amount of free air tracking along the diaphragm with scattered foci of free air around the liver. Soft tissue inflammation in the mid sigmoid colon which could reflect acute diverticulitis and possible site of perforation.  History of present illness: The patient is a 55 year old white female his primary care is Dr. Shaune Pollack presented with abdominal pain yesterday. She had a similar episode several weeks ago which lasted 3 days but not as bad as the current one. She was seen in the emergency room and [facility] with the above-noted CT finding and an elevated white count of 14,200. She was admitted by Dr. Ovidio Kin for antibiotics and further evaluation as needed.  Hospital Course: Patient was admitted and placed on IV Zosyn, made n.p.o., and bowel rest. Her pain was controlled with IV medication. She had no evidence of peritonitis and was started on ice chip and her diet has been slowly advanced to a low residual diet. Her white count has come down to 4900 on 04/19/2011. Her abdominal discomfort has essentially resolved she has some mild residual discomfort on deep palpation . She was seen in the a.m. of 04/21/2011 by Dr. Derrell Lolling and it was his opinion she can be discharged home. He plans a low residual diet for the next week and then convert her to a high fiber low fat diet after that. He plans Cipro 500 mg twice a day and Flagyl 500 mg 3 times a day for the next 14 days. We will have the patient call make a followup appointment with Dr. Ezzard Standing in 3 week. It was Dr. Jacinto Halim opinion that the patient did  not need a repeat CT scan at this time. He feels this can be done as an outpatient in followup. Followup: Dr. Ovidio Kin 3-4 weeks she will call make an appointment. Dr. Shaune Pollack call for an appointment in followup. Discharge meds listed below. Condition on discharge: Improved Will Carole Doner physician assistant for Dr. Claud Kelp          Disposition: Final discharge disposition not confirmed   Current Discharge Medication List    START taking these medications   Details  ciprofloxacin (CIPRO) 500 MG tablet Take 1 tablet (500 mg total) by mouth 2 (two) times daily. Qty: 30 tablet, Refills: 0    HYDROcodone-acetaminophen (NORCO) 5-325 MG per tablet Take 1-2 tablets by mouth every 4 (four) hours as needed. Qty: 40 tablet, Refills: 0    metroNIDAZOLE (FLAGYL) 500 MG tablet Take 1 tablet (500 mg total) by mouth every 8 (eight) hours. Qty: 42 tablet, Refills: 0      CONTINUE these medications which have NOT CHANGED   Details  Cholecalciferol (VITAMIN D) 2000 UNITS tablet Take 2,000 Units by mouth daily.      cycloSPORINE (RESTASIS) 0.05 % ophthalmic emulsion Place 1 drop into both eyes 2 (two) times daily.      fluticasone (FLONASE) 50 MCG/ACT nasal spray Place 1 spray into the nose daily.      Multiple Vitamins-Minerals (MULTIVITAMINS THER. W/MINERALS) TABS Take 1 tablet by mouth daily.      naproxen sodium (ANAPROX) 220 MG tablet Take 220-440 mg by mouth once as needed. For pain  Follow-up Information    Make an appointment with [facility], MD. (If pain or fever return call sooner. if symptoms worsen)    Contact information:   [facility] [address] [place] [address] [phone]          Signed: Sherrie George 04/21/2011, 11:29 AM

## 2011-04-21 NOTE — ED Provider Notes (Signed)
History     CSN: [id number] Arrival date & time: 04/21/2011  8:34 PM   First MD Initiated Contact with Patient 04/21/11 2302      Chief Complaint  Patient presents with  . Abdominal Pain  . Nausea    (Consider location/radiation/quality/duration/timing/severity/associated sxs/prior treatment) Patient is a 55 y.o. female presenting with abdominal pain.  Abdominal Pain The primary symptoms of the illness include abdominal pain.  The abdominal pain began 3 to 5 hours ago. The abdominal pain has been gradually improving since its onset. The abdominal pain is located in the RLQ. The abdominal pain does not radiate. The severity of the abdominal pain is 2/10. The abdominal pain is relieved by nothing.   Pt had been in the hospital for diverticulitis.  She was discharged at 5pm.  Pt started to have some pain after the leaving the hospital.  The pain became worse again at home.  Pt felt chilled as well.  The pain has subsequently subsided.  Pt vomited once as well.  Pt had been in the hospital for 5 days.  She called the doctor on call and was told to come to the ED. Past Medical History  Diagnosis Date  . Heart murmur   . Asthma     Past Surgical History  Procedure Date  . Hernia repair     No family history on file.  History  Substance Use Topics  . Smoking status: Never Smoker   . Smokeless tobacco: Not on file  . Alcohol Use: 4.2 oz/week    7 Glasses of wine per week    OB History    Grav Para Term Preterm Abortions TAB SAB Ect Mult Living                  Review of Systems  Gastrointestinal: Positive for abdominal pain.  All other systems reviewed and are negative.    Allergies  Codeine  Home Medications   Current Outpatient Rx  Name Route Sig Dispense Refill  . VITAMIN D 2000 UNITS PO TABS Oral Take 2,000 Units by mouth daily.      [name] CIPROFLOXACIN HCL 500 MG PO TABS Oral Take 1 tablet (500 mg total) by mouth 2 (two) times daily. 30 tablet 0  .  CYCLOSPORINE 0.05 % OP EMUL Both Eyes Place 1 drop into both eyes 2 (two) times daily.      [name] FLUTICASONE PROPIONATE 50 MCG/ACT NA SUSP Nasal Place 1 spray into the nose daily as needed. ALLERGIES    . METRONIDAZOLE 500 MG PO TABS Oral Take 1 tablet (500 mg total) by mouth every 8 (eight) hours. 42 tablet 0  . THERA M PLUS PO TABS Oral Take 1 tablet by mouth daily.      [name] NAPROXEN SODIUM 220 MG PO TABS Oral Take 220-440 mg by mouth once as needed. For pain      BP 118/64  Pulse 101  Temp(Src) 98.8 F (37.1 C) (Oral)  Resp 100  SpO2 100%  LMP 04/18/2011  Physical Exam  Nursing note and vitals reviewed. Constitutional: She appears well-developed and well-nourished. No distress.  HENT:  Head: Normocephalic and atraumatic.  Right Ear: External ear normal.  Left Ear: External ear normal.  Eyes: Conjunctivae are normal. Right eye exhibits no discharge. Left eye exhibits no discharge. No scleral icterus.  Neck: Neck supple. No tracheal deviation present.  Cardiovascular: Normal rate, regular rhythm and intact distal pulses.   Pulmonary/Chest: Effort normal and breath sounds normal. No  stridor. No respiratory distress. She has no wheezes. She has no rales.  Abdominal: Soft. Bowel sounds are normal. She exhibits no distension. There is tenderness. There is no rebound and no guarding.    Musculoskeletal: She exhibits no edema and no tenderness.  Neurological: She is alert. She has normal strength. No sensory deficit. Cranial nerve deficit:  no gross defecits noted. She exhibits normal muscle tone. She displays no seizure activity. Coordination normal.  Skin: Skin is warm and dry. No rash noted.  Psychiatric: She has a normal mood and affect.    ED Course  Procedures (including critical care time)  Medications  0.9 %  sodium chloride infusion (1000 mL Intravenous New Bag 04/21/11 2327)  piperacillin-tazobactam (ZOSYN) 3.375 g in dextrose 5 % 50 mL IVPB (not administered)    piperacillin-tazobactam (ZOSYN) IVPB 3.375 g (not administered)  ondansetron (ZOFRAN) injection 4 mg (4 mg Intravenous Given 04/21/11 2324)  HYDROmorphone (DILAUDID) 2 MG/ML injection (1 mg Intravenous Given 04/21/11 2323)  iohexol (OMNIPAQUE) 300 MG/ML injection 100 mL (100 mL Intravenous Contrast Given 04/22/11 0354)    Labs Reviewed  CBC - Abnormal; Notable for the following:    WBC 15.3 (*)    All other components within normal limits  DIFFERENTIAL - Abnormal; Notable for the following:    Neutrophils Relative 93 (*)    Neutro Abs 14.1 (*)    Lymphocytes Relative 3 (*)    Lymphs Abs 0.5 (*)    All other components within normal limits  COMPREHENSIVE METABOLIC PANEL - Abnormal; Notable for the following:    Potassium 3.3 (*)    Glucose, Bld 126 (*)    GFR calc non Af Amer 76 (*)    GFR calc Af Amer 88 (*)    All other components within normal limits  LIPASE, BLOOD  URINALYSIS, ROUTINE W REFLEX MICROSCOPIC   Dg Abd Acute W/chest  04/21/2011  *RADIOLOGY REPORT*  Clinical Data: Abdominal pain  ACUTE ABDOMEN SERIES (ABDOMEN 2 VIEW & CHEST 1 VIEW)  Comparison: 04/16/2011 CT  Findings: Mild bibasilar atelectasis.  Otherwise lungs are clear. Cardiomediastinal contours within normal limits.  Nonobstructive bowel gas pattern.  High attenuation of the colon is in keeping with previously ingested contrast.  The previously noted free intraperitoneal air has predominately resolved.  There may be mild residual along the undersurface of the mid diaphragm.  IMPRESSION: Nonobstructive bowel gas pattern.  Decreased free intraperitoneal air.  Original Report Authenticated By: Waneta Martins, M.D.     1. Diverticulitis of large intestine with perforation       MDM  Patient return to the hospital this evening after just being discharged today. The patient had been receiving inpatient treatment for an acute diverticulitis with perforation. A repeat CT scan was performed and demonstrates that  she has now developed a early abscess. I discussed the case with Dr. Biagio Quint  this evening. Patient will be started on IV Zosyn again and will be readmitted to the hospital.        Celene Kras, MD 04/22/11 [phone]

## 2011-04-21 NOTE — ED Notes (Signed)
Pt states she was admitted to [facility] last Friday for the same pain and was discharged today around 5 pm.  C/o abdominal pain.  Was dx with "air belly" and "perforated diverticulitis".  States she was doing well with no pain or symptoms of any kind but when she got back home.  Had diarrhea today.  C/o nausea but no vomiting.

## 2011-04-21 NOTE — Progress Notes (Signed)
  Subjective: Feeling much better.  +BM yesterday on low residual diet.  Objective: Vital signs in last 24 hours: Temp:  [97.7 F (36.5 C)-98.7 F (37.1 C)] 98.7 F (37.1 C) (11/20 0515) Pulse Rate:  [62-79] 62  (11/20 0515) Resp:  [16] 16  (11/20 0515) BP: (101-107)/(65-67) 105/65 mmHg (11/20 0515) SpO2:  [95 %-98 %] 95 % (11/20 0515) Last BM Date: 04/19/11  Intake/Output from previous day: 11/19 0701 - 11/20 0700 In: 1830 [P.O.:1080; I.V.:750] Out: 625 [Urine:625] Intake/Output this shift:    General appearance: alert, cooperative and no distress GI: Soft, if you ask she still has some discomfort mostly below umbilicus on Right mid portion of abd.  +BS, no distension  Lab Results:   [facility] 04/19/11 0535  WBC 4.9  HGB 11.4*  HCT 34.0*  PLT 196    BMET  Basename 04/19/11 0535  NA 138  K 4.1  CL 107  CO2 24  GLUCOSE 119*  BUN 8  CREATININE 0.94  CALCIUM 9.2   PT/INR No results found for this basename: LABPROT:2,INR:2 in the last 72 hours   Studies/Results: No results found.  Anti-infectives: Anti-infectives     Start     Dose/Rate Route Frequency Ordered Stop   04/17/11 2330  piperacillin-tazobactam (ZOSYN) IVPB 3.375 g       3.375 g 12.5 mL/hr over 240 Minutes Intravenous 3 times per day 04/17/11 2317     04/17/11 0230   piperacillin-tazobactam (ZOSYN) IVPB 3.375 g  Status:  Discontinued        3.375 g 12.5 mL/hr over 240 Minutes Intravenous 4 times per day 04/17/11 0217 04/17/11 0221   04/17/11 0230  piperacillin-tazobactam (ZOSYN) IVPB 3.375 g       3.375 g 100 mL/hr over 30 Minutes Intravenous To Emergency Dept 04/17/11 0221 04/17/11 0313         Current Facility-Administered Medications  Medication Dose Route Frequency Provider Last Rate Last Dose  . dextrose 5 % and 0.45 % NaCl with KCl 20 mEq/L infusion   Intravenous Continuous [name], MD 75 mL/hr at 04/21/11 0008    . HYDROcodone-acetaminophen (NORCO) 5-325 MG per tablet  1-2 tablet  1-2 tablet Oral Q4H PRN [name], MD      . morphine 2 MG/ML injection 1-3 mg  1-3 mg Intravenous Q2H PRN Kandis Cocking, MD   2 mg at 04/18/11 0007  . ondansetron (ZOFRAN) injection 4 mg  4 mg Intravenous Q6H PRN Kandis Cocking, MD   4 mg at 04/17/11 0252  . piperacillin-tazobactam (ZOSYN) IVPB 3.375 g  3.375 g Intravenous Q8H Mariella Saa, MD   3.375 g at 04/21/11 0104    Assessment/Plan   Presumed diverticulitis with perforation.  Patient Active Problem List  Diagnoses  . Sjoegren syndrome  . Asthma    Doing well on low residual diet. Currently on zosyn. Will plan to D/c either today or in AM on 14 days of Cipro/flagyl.  With follow up CT as out patient and follow up with DR. Newman.  LOS: 5 days    Annette Gentry 04/21/2011

## 2011-04-21 NOTE — ED Notes (Signed)
Discharged from this facility at 1700 today.  After getting home, patient began having increased pain and had on mushy BM.  States that at present and after having a BM and one bout of emesis, her abdomen feels MUCH better.

## 2011-04-21 NOTE — Plan of Care (Signed)
Problem: Food- and Nutrition-Related Knowledge Deficit (NB-1.1) Goal: Nutrition education Formal process to instruct or train a patient/client in a skill or to impart knowledge to help patients/clients voluntarily manage or modify food choices and eating behavior to maintain or improve health.  Outcome: Completed/Met Date Met:  04/21/11 Discussed goals of nutrition therapy related to low fiber diet for healing, and high fiber diet for maintenance of diverticulosis.  Pt appreciative and asks appropriate questions. Reviewed handout with patient and discussed home diet. Also reviewed principles for low fat diet therapy.  Pt verbalizes understanding.  All questions answered at this time.  Expect good compliance.  RD contact information provided.

## 2011-04-21 NOTE — Discharge Summary (Signed)
She is doing well and is appropriate for discharge. She will followup with [place] in the office in one month. Consideration will be given to follow up CT or barium enema at that time.

## 2011-04-21 NOTE — Progress Notes (Signed)
Ready for discharge. Will ask nutrition service to educate in high fiber, low fat diet for the long term.  Rx  P.O. cipro and Flagyl for 14 days. Return for office visit in 4 weeks.  Consider BE and/or CT as outpatient

## 2011-04-22 ENCOUNTER — Other Ambulatory Visit: Payer: Self-pay

## 2011-04-22 ENCOUNTER — Encounter (HOSPITAL_COMMUNITY): Payer: Self-pay | Admitting: Anesthesiology

## 2011-04-22 ENCOUNTER — Other Ambulatory Visit (INDEPENDENT_AMBULATORY_CARE_PROVIDER_SITE_OTHER): Payer: Self-pay | Admitting: General Surgery

## 2011-04-22 ENCOUNTER — Encounter (HOSPITAL_COMMUNITY): Payer: Self-pay

## 2011-04-22 ENCOUNTER — Emergency Department (HOSPITAL_COMMUNITY): Payer: BC Managed Care – PPO | Admitting: Anesthesiology

## 2011-04-22 ENCOUNTER — Other Ambulatory Visit (HOSPITAL_COMMUNITY): Payer: BC Managed Care – PPO

## 2011-04-22 ENCOUNTER — Emergency Department (HOSPITAL_COMMUNITY): Payer: BC Managed Care – PPO

## 2011-04-22 ENCOUNTER — Encounter (HOSPITAL_COMMUNITY): Admission: EM | Disposition: A | Discharge status: Home Health | Payer: Self-pay | Source: Home / Self Care

## 2011-04-22 HISTORY — PX: COLOSTOMY: SHX63

## 2011-04-22 HISTORY — PX: COLON RESECTION: SHX5231

## 2011-04-22 LAB — URINE MICROSCOPIC-ADD ON

## 2011-04-22 LAB — CBC
Hemoglobin: 11.8 g/dL — ABNORMAL LOW (ref 12.0–15.0)
MCH: 30.7 pg (ref 26.0–34.0)
MCV: 91.4 fL (ref 78.0–100.0)
RBC: 3.84 MIL/uL — ABNORMAL LOW (ref 3.87–5.11)

## 2011-04-22 LAB — COMPREHENSIVE METABOLIC PANEL
ALT: 12 U/L (ref 0–35)
AST: 14 U/L (ref 0–37)
CO2: 22 mEq/L (ref 19–32)
Calcium: 9.9 mg/dL (ref 8.4–10.5)
Chloride: 101 mEq/L (ref 96–112)
GFR calc non Af Amer: 76 mL/min — ABNORMAL LOW (ref >90)
Potassium: 3.3 mEq/L — ABNORMAL LOW (ref 3.5–5.1)
Sodium: 135 mEq/L (ref 135–145)

## 2011-04-22 LAB — URINALYSIS, ROUTINE W REFLEX MICROSCOPIC
Glucose, UA: NEGATIVE mg/dL
Protein, ur: NEGATIVE mg/dL

## 2011-04-22 SURGERY — COLON RESECTION
Anesthesia: General | Site: Abdomen | Wound class: Dirty or Infected

## 2011-04-22 MED ORDER — PIPERACILLIN-TAZOBACTAM 3.375 G IVPB
3.3750 g | Freq: Three times a day (TID) | INTRAVENOUS | Status: DC
  Filled 2011-04-22 (×3): qty 50

## 2011-04-22 MED ORDER — KCL IN DEXTROSE-NACL 20-5-0.45 MEQ/L-%-% IV SOLN
INTRAVENOUS | Status: DC
  Administered 2011-04-22 – 2011-04-23 (×4): via INTRAVENOUS
  Administered 2011-04-25: 20 mL/h via INTRAVENOUS
  Filled 2011-04-22 (×8): qty 1000

## 2011-04-22 MED ORDER — FENTANYL CITRATE 0.05 MG/ML IJ SOLN
INTRAMUSCULAR | Status: AC
  Filled 2011-04-22: qty 2

## 2011-04-22 MED ORDER — GLYCOPYRROLATE 0.2 MG/ML IJ SOLN
INTRAMUSCULAR | Status: DC | PRN
  Administered 2011-04-22: .6 mg via INTRAVENOUS

## 2011-04-22 MED ORDER — SUCCINYLCHOLINE CHLORIDE 20 MG/ML IJ SOLN
INTRAMUSCULAR | Status: DC | PRN
  Administered 2011-04-22: 100 mg via INTRAVENOUS

## 2011-04-22 MED ORDER — MIDAZOLAM HCL 5 MG/5ML IJ SOLN
INTRAMUSCULAR | Status: DC | PRN
  Administered 2011-04-22: 2 mg via INTRAVENOUS

## 2011-04-22 MED ORDER — ENOXAPARIN SODIUM 40 MG/0.4ML ~~LOC~~ SOLN: 40.0000 mg | SUBCUTANEOUS | Status: DC

## 2011-04-22 MED ORDER — FAMOTIDINE IN NACL 20-0.9 MG/50ML-% IV SOLN
20.0000 mg | Freq: Two times a day (BID) | INTRAVENOUS | Status: DC
  Administered 2011-04-22 – 2011-04-25 (×7): 20 mg via INTRAVENOUS
  Filled 2011-04-22 (×9): qty 50

## 2011-04-22 MED ORDER — IOHEXOL 300 MG/ML  SOLN
100.0000 mL | Freq: Once | INTRAMUSCULAR | Status: AC | PRN
  Administered 2011-04-22: 100 mL via INTRAVENOUS

## 2011-04-22 MED ORDER — LACTATED RINGERS IV SOLN
INTRAVENOUS | Status: DC
  Administered 2011-04-22 (×2): via INTRAVENOUS
  Administered 2011-04-22: 1000 mL via INTRAVENOUS

## 2011-04-22 MED ORDER — SODIUM CHLORIDE 0.9 % IV SOLN
INTRAVENOUS | Status: AC
  Filled 2011-04-22: qty 50

## 2011-04-22 MED ORDER — SODIUM CHLORIDE 0.9 % IV SOLN
1.0000 g | INTRAVENOUS | Status: DC
  Administered 2011-04-23 – 2011-04-25 (×3): 1 g via INTRAVENOUS
  Filled 2011-04-22 (×5): qty 1

## 2011-04-22 MED ORDER — LIDOCAINE HCL (CARDIAC) 20 MG/ML IV SOLN
INTRAVENOUS | Status: DC | PRN
  Administered 2011-04-22: 100 mg via INTRAVENOUS

## 2011-04-22 MED ORDER — FENTANYL CITRATE 0.05 MG/ML IJ SOLN
25.0000 ug | INTRAMUSCULAR | Status: DC | PRN
  Administered 2011-04-22 (×2): 25 ug via INTRAVENOUS
  Administered 2011-04-22: 50 ug via INTRAVENOUS

## 2011-04-22 MED ORDER — FLUTICASONE PROPIONATE 50 MCG/ACT NA SUSP
2.0000 | Freq: Every day | NASAL | Status: DC
  Administered 2011-04-25 – 2011-04-28 (×2): 2 via NASAL
  Filled 2011-04-22: qty 16

## 2011-04-22 MED ORDER — NEOSTIGMINE METHYLSULFATE 1 MG/ML IJ SOLN
INTRAMUSCULAR | Status: DC | PRN
  Administered 2011-04-22: 4 mg via INTRAVENOUS

## 2011-04-22 MED ORDER — FENTANYL CITRATE 0.05 MG/ML IJ SOLN
INTRAMUSCULAR | Status: DC | PRN
  Administered 2011-04-22: 50 ug via INTRAVENOUS
  Administered 2011-04-22: 100 ug via INTRAVENOUS
  Administered 2011-04-22 (×2): 50 ug via INTRAVENOUS

## 2011-04-22 MED ORDER — CYCLOSPORINE 0.05 % OP EMUL
1.0000 [drp] | Freq: Two times a day (BID) | OPHTHALMIC | Status: DC
  Administered 2011-04-22 – 2011-04-26 (×8): 1 [drp] via OPHTHALMIC
  Administered 2011-04-26: 22:00:00 via OPHTHALMIC
  Administered 2011-04-27 – 2011-04-28 (×3): 1 [drp] via OPHTHALMIC
  Filled 2011-04-22 (×14): qty 1

## 2011-04-22 MED ORDER — ROCURONIUM BROMIDE 100 MG/10ML IV SOLN
INTRAVENOUS | Status: DC | PRN
  Administered 2011-04-22: 35 mg via INTRAVENOUS
  Administered 2011-04-22: 5 mg via INTRAVENOUS

## 2011-04-22 MED ORDER — KCL IN DEXTROSE-NACL 20-5-0.45 MEQ/L-%-% IV SOLN: INTRAVENOUS | Status: DC

## 2011-04-22 MED ORDER — ONDANSETRON HCL 4 MG PO TABS: 4.0000 mg | ORAL_TABLET | Freq: Four times a day (QID) | ORAL | Status: DC | PRN

## 2011-04-22 MED ORDER — HYDROMORPHONE HCL PF 1 MG/ML IJ SOLN
INTRAMUSCULAR | Status: DC | PRN
  Administered 2011-04-22: .2 mg via INTRAVENOUS
  Administered 2011-04-22: .8 mg via INTRAVENOUS
  Administered 2011-04-22: 1 mg via INTRAVENOUS

## 2011-04-22 MED ORDER — MORPHINE SULFATE 2 MG/ML IJ SOLN: 2.0000 mg | INTRAMUSCULAR | Status: DC | PRN

## 2011-04-22 MED ORDER — DEXAMETHASONE SODIUM PHOSPHATE 4 MG/ML IJ SOLN
INTRAMUSCULAR | Status: DC | PRN
  Administered 2011-04-22: 10 mg via INTRAVENOUS

## 2011-04-22 MED ORDER — ONDANSETRON HCL 4 MG/2ML IJ SOLN: 4.0000 mg | Freq: Four times a day (QID) | INTRAMUSCULAR | Status: DC | PRN

## 2011-04-22 MED ORDER — HEPARIN SODIUM (PORCINE) 5000 UNIT/ML IJ SOLN
5000.0000 [IU] | Freq: Three times a day (TID) | INTRAMUSCULAR | Status: DC
  Administered 2011-04-23 – 2011-04-28 (×14): 5000 [IU] via SUBCUTANEOUS
  Filled 2011-04-22 (×19): qty 1

## 2011-04-22 MED ORDER — SODIUM CHLORIDE 0.9 % IV SOLN
1.0000 g | INTRAVENOUS | Status: AC
  Administered 2011-04-22: 1 g via INTRAVENOUS

## 2011-04-22 MED ORDER — LACTATED RINGERS IV SOLN: INTRAVENOUS | Status: DC

## 2011-04-22 MED ORDER — ACETAMINOPHEN 10 MG/ML IV SOLN
INTRAVENOUS | Status: AC
  Filled 2011-04-22: qty 100

## 2011-04-22 MED ORDER — PIPERACILLIN SOD-TAZOBACTAM SO 2.25 (2-0.25) G IV SOLR
3.3750 g | INTRAVENOUS | Status: AC
  Administered 2011-04-22: 3.375 g via INTRAVENOUS
  Filled 2011-04-22: qty 3.38

## 2011-04-22 MED ORDER — MORPHINE SULFATE 2 MG/ML IJ SOLN
2.0000 mg | INTRAMUSCULAR | Status: DC | PRN
  Administered 2011-04-22 – 2011-04-23 (×2): 2 mg via INTRAVENOUS
  Filled 2011-04-22 (×2): qty 1

## 2011-04-22 MED ORDER — PROPOFOL 10 MG/ML IV EMUL
INTRAVENOUS | Status: DC | PRN
  Administered 2011-04-22: 180 mg via INTRAVENOUS

## 2011-04-22 MED ORDER — ACETAMINOPHEN 10 MG/ML IV SOLN
INTRAVENOUS | Status: DC | PRN
  Administered 2011-04-22: 1000 mg via INTRAVENOUS

## 2011-04-22 MED ORDER — ONDANSETRON HCL 4 MG/2ML IJ SOLN
INTRAMUSCULAR | Status: DC | PRN
  Administered 2011-04-22: 4 mg via INTRAVENOUS

## 2011-04-22 MED ORDER — PROMETHAZINE HCL 25 MG/ML IJ SOLN
6.2500 mg | INTRAMUSCULAR | Status: DC | PRN
  Filled 2011-04-22: qty 1

## 2011-04-22 MED ORDER — SODIUM CHLORIDE 0.9 % IR SOLN
Status: DC | PRN
  Administered 2011-04-22: 2000 mL

## 2011-04-22 SURGICAL SUPPLY — 55 items
APPLICATOR COTTON TIP 6IN STRL (MISCELLANEOUS) IMPLANT
BLADE EXTENDED COATED 6.5IN (ELECTRODE) ×2 IMPLANT
BLADE HEX COATED 2.75 (ELECTRODE) ×2 IMPLANT
BLADE SURG SZ10 CARB STEEL (BLADE) ×2 IMPLANT
CANISTER SUCTION 2500CC (MISCELLANEOUS) ×2 IMPLANT
CLIP TI LARGE 6 (CLIP) IMPLANT
CLOTH BEACON ORANGE TIMEOUT ST (SAFETY) ×2 IMPLANT
COVER MAYO STAND STRL (DRAPES) ×2 IMPLANT
DRAPE LAPAROSCOPIC ABDOMINAL (DRAPES) ×2 IMPLANT
DRAPE LG THREE QUARTER DISP (DRAPES) IMPLANT
DRAPE WARM FLUID 44X44 (DRAPE) ×2 IMPLANT
DRESSING TELFA 8X3 (GAUZE/BANDAGES/DRESSINGS) ×2 IMPLANT
ELECT REM PT RETURN 9FT ADLT (ELECTROSURGICAL) ×2
ELECTRODE REM PT RTRN 9FT ADLT (ELECTROSURGICAL) ×1 IMPLANT
GAUZE SPONGE 4X4 12PLY STRL LF (GAUZE/BANDAGES/DRESSINGS) ×2 IMPLANT
GLOVE BIOGEL PI IND STRL 7.0 (GLOVE) ×1 IMPLANT
GLOVE BIOGEL PI INDICATOR 7.0 (GLOVE) ×1
GLOVE EUDERMIC 7 POWDERFREE (GLOVE) ×12 IMPLANT
GOWN STRL NON-REIN LRG LVL3 (GOWN DISPOSABLE) ×6 IMPLANT
GOWN STRL REIN XL XLG (GOWN DISPOSABLE) ×4 IMPLANT
HAND ACTIVATED (MISCELLANEOUS) IMPLANT
KIT BASIN OR (CUSTOM PROCEDURE TRAY) ×2 IMPLANT
LEGGING LITHOTOMY PAIR STRL (DRAPES) IMPLANT
LIGASURE IMPACT 36 18CM CVD LR (INSTRUMENTS) ×2 IMPLANT
NS IRRIG 1000ML POUR BTL (IV SOLUTION) ×8 IMPLANT
PACK GENERAL/GYN (CUSTOM PROCEDURE TRAY) ×2 IMPLANT
RELOAD PROXIMATE 75MM BLUE (ENDOMECHANICALS) ×2 IMPLANT
SEPRAFILM PROCEDURAL PACK 3X5 (MISCELLANEOUS) ×2 IMPLANT
SPONGE GAUZE 4X4 12PLY (GAUZE/BANDAGES/DRESSINGS) ×2 IMPLANT
SPONGE LAP 18X18 X RAY DECT (DISPOSABLE) ×2 IMPLANT
STAPLER PROXIMATE 75MM BLUE (STAPLE) ×2 IMPLANT
STAPLER VISISTAT 35W (STAPLE) ×2 IMPLANT
SUCTION POOLE TIP (SUCTIONS) ×2 IMPLANT
SUT NOV 1 T60/GS (SUTURE) IMPLANT
SUT NOVA NAB DX-16 0-1 5-0 T12 (SUTURE) IMPLANT
SUT NOVA T20/GS 25 (SUTURE) IMPLANT
SUT PDS AB 1 CTX 36 (SUTURE) IMPLANT
SUT PDS AB 1 TP1 54 (SUTURE) IMPLANT
SUT PDS AB 1 TP1 96 (SUTURE) ×4 IMPLANT
SUT PROLENE 2 0 KS (SUTURE) IMPLANT
SUT PROLENE 2 0 SH DA (SUTURE) ×2 IMPLANT
SUT SILK 2 0 (SUTURE) ×1
SUT SILK 2 0 SH CR/8 (SUTURE) ×2 IMPLANT
SUT SILK 2 0SH CR/8 30 (SUTURE) IMPLANT
SUT SILK 2-0 18XBRD TIE 12 (SUTURE) ×1 IMPLANT
SUT SILK 2-0 30XBRD TIE 12 (SUTURE) IMPLANT
SUT SILK 3 0 (SUTURE) ×1
SUT SILK 3 0 SH CR/8 (SUTURE) ×2 IMPLANT
SUT SILK 3-0 18XBRD TIE 12 (SUTURE) ×1 IMPLANT
SUT VIC AB 3-0 SH 18 (SUTURE) ×2 IMPLANT
TAPE HYPAFIX 4 X10 (GAUZE/BANDAGES/DRESSINGS) ×2 IMPLANT
TOWEL OR 17X26 10 PK STRL BLUE (TOWEL DISPOSABLE) ×4 IMPLANT
TRAY FOLEY CATH 14FRSI W/METER (CATHETERS) ×2 IMPLANT
WATER STERILE IRR 1500ML POUR (IV SOLUTION) IMPLANT
YANKAUER SUCT BULB TIP NO VENT (SUCTIONS) ×2 IMPLANT

## 2011-04-22 NOTE — ED Notes (Signed)
Received report from [place], Charity fundraiser. Pt moved to TCU.

## 2011-04-22 NOTE — Progress Notes (Signed)
Subjective: Returned to ER last PM. She started having pain, some nausea, and chills.  Currently she has had antibiotics, pain relief, and is not febrile.    Objective: Vital signs in last 24 hours: Temp:  [97.8 F (36.6 C)-99.5 F (37.5 C)] 99.5 F (37.5 C) (11/21 0036) Pulse Rate:  [84-101] 85  (11/21 0036) Resp:  [16-100] 100  (11/20 2037) BP: (104-118)/(57-67) 104/57 mmHg (11/21 0036) SpO2:  [96 %-100 %] 96 % (11/21 0036)    Intake/Output from previous day:   Intake/Output this shift:    General appearance: alert, cooperative and no distress Resp: clear to auscultation bilaterally GI: soft, non-tender; bowel sounds normal; no masses,  no organomegaly and She notes some pain rather deep, more on Left side than Right.  Lab Results:   [facility] 04/21/11 2324  WBC 15.3*  HGB 13.4  HCT 38.7  PLT 242    BMET  Basename 04/21/11 2324  NA 135  K 3.3*  CL 101  CO2 22  GLUCOSE 126*  BUN 12  CREATININE 0.85  CALCIUM 9.9   PT/INR No results found for this basename: LABPROT:2,INR:2 in the last 72 hours   Studies/Results: Ct Abdomen Pelvis W Contrast  04/22/2011  *RADIOLOGY REPORT*  Clinical Data: Abdominal pain, recent diverticulitis.  CT ABDOMEN AND PELVIS WITH CONTRAST  Technique:  Multidetector CT imaging of the abdomen and pelvis was performed following the standard protocol during bolus administration of intravenous contrast.  Contrast: OMNIPAQUE IOHEXOL 300 MG/ML IV SOLN  Comparison: 04/16/2011  Findings: Mild bibasilar opacities are favored to represent atelectasis.  Unremarkable liver, biliary system, spleen, pancreas, adrenal glands, kidneys.  No hydronephrosis or hydroureter.  There is marked thickening and pericolonic stranding of a segment of sigmoid colon in keeping with acute diverticulitis that has progressed in the interval. Ill-defined extraluminal fluid and air collection has developed adjacent to the inflamed segment in the interval.  This  collection measures approximately 4.2 x 4.1 cm. The inflammatory process is inseparable from an adjacent loop of small bowel which demonstrates likely reactive wall thickening.  No bowel obstruction.  Normal appendix.  Decreased free intraperitoneal air in the interval.  Normal caliber vasculature.  Fibroid uterus.  Mild circumferential bladder wall thickening is nonspecific.  Small amount of free fluid within the pelvis.  No adnexal mass.  No acute osseous abnormality. L5 sclerotic focus is likely a bone island.  IMPRESSION: Interval progression of sigmoid colon diverticulitis with increased pericolonic fat stranding and development of an ill-defined air and fluid collection, in keeping with contained perforation and phlegmonous changes.  No defined wall to the collection at this time.  Discussed via telephone with Dr. Lynelle Doctor at 04:10 a.m. on 04/22/2011.  Original Report Authenticated By: Waneta Martins, M.D.   Dg Abd Acute W/chest  04/21/2011  *RADIOLOGY REPORT*  Clinical Data: Abdominal pain  ACUTE ABDOMEN SERIES (ABDOMEN 2 VIEW & CHEST 1 VIEW)  Comparison: 04/16/2011 CT  Findings: Mild bibasilar atelectasis.  Otherwise lungs are clear. Cardiomediastinal contours within normal limits.  Nonobstructive bowel gas pattern.  High attenuation of the colon is in keeping with previously ingested contrast.  The previously noted free intraperitoneal air has predominately resolved.  There may be mild residual along the undersurface of the mid diaphragm.  IMPRESSION: Nonobstructive bowel gas pattern.  Decreased free intraperitoneal air.  Original Report Authenticated By: Waneta Martins, M.D.    Anti-infectives: Anti-infectives     Start     Dose/Rate Route Frequency Ordered Stop  04/22/11 1100  piperacillin-tazobactam (ZOSYN) IVPB 3.375 g       3.375 g 12.5 mL/hr over 240 Minutes Intravenous 3 times per day 04/22/11 0417     04/22/11 0430  piperacillin-tazobactam (ZOSYN) 3.375 g in dextrose 5 % 50 mL  IVPB       3.375 g 100 mL/hr over 30 Minutes Intravenous To Emergency Dept 04/22/11 0411 04/22/11 0502         Current Facility-Administered Medications  Medication Dose Route Frequency Provider Last Rate Last Dose  . 0.9 %  sodium chloride infusion  999 mL Intravenous Continuous Celene Kras, MD 100 mL/hr at 04/21/11 2327 1,000 mL at 04/21/11 2327  . HYDROmorphone (DILAUDID) 2 MG/ML injection        1 mg at 04/21/11 2323  . iohexol (OMNIPAQUE) 300 MG/ML injection 100 mL  100 mL Intravenous Once PRN Medication Radiologist   100 mL at 04/22/11 0354  . ondansetron (ZOFRAN) injection 4 mg  4 mg Intravenous Once Celene Kras, MD   4 mg at 04/21/11 2324  . piperacillin-tazobactam (ZOSYN) 3.375 g in dextrose 5 % 50 mL IVPB  3.375 g Intravenous To ER Celene Kras, MD   3.375 g at 04/22/11 0432  . piperacillin-tazobactam (ZOSYN) IVPB 3.375 g  3.375 g Intravenous Q8H Julian Crowford Darlina Guys., PHARMD      . DISCONTD: HYDROmorphone (DILAUDID) injection 1 mg  1 mg Intravenous Once Celene Kras, MD       Current Outpatient Prescriptions  Medication Sig Dispense Refill  . Cholecalciferol (VITAMIN D) 2000 UNITS tablet Take 2,000 Units by mouth daily.        . ciprofloxacin (CIPRO) 500 MG tablet Take 1 tablet (500 mg total) by mouth 2 (two) times daily.  30 tablet  0  . cycloSPORINE (RESTASIS) 0.05 % ophthalmic emulsion Place 1 drop into both eyes 2 (two) times daily.        . fluticasone (FLONASE) 50 MCG/ACT nasal spray Place 1 spray into the nose daily as needed. ALLERGIES      . metroNIDAZOLE (FLAGYL) 500 MG tablet Take 1 tablet (500 mg total) by mouth every 8 (eight) hours.  42 tablet  0  . Multiple Vitamins-Minerals (MULTIVITAMINS THER. W/MINERALS) TABS Take 1 tablet by mouth daily.        . naproxen sodium (ANAPROX) 220 MG tablet Take 220-440 mg by mouth once as needed. For pain       Facility-Administered Medications Ordered in Other Encounters  Medication Dose Route Frequency Provider Last Rate  Last Dose  . DISCONTD: ciprofloxacin (CIPRO) tablet 500 mg  500 mg Oral BID Sherrie George, PA   500 mg at 04/21/11 1250  . DISCONTD: dextrose 5 % and 0.45 % NaCl with KCl 20 mEq/L infusion   Intravenous Continuous [name], MD 75 mL/hr at 04/21/11 0008    . DISCONTD: HYDROcodone-acetaminophen (NORCO) 5-325 MG per tablet 1-2 tablet  1-2 tablet Oral Q4H PRN [name], MD      . DISCONTD: metroNIDAZOLE (FLAGYL) tablet 500 mg  500 mg Oral Q8H Sherrie George, [place]   500 mg at 04/21/11 1345  . DISCONTD: morphine 2 MG/ML injection 1-3 mg  1-3 mg Intravenous Q2H PRN Kandis Cocking, MD   2 mg at 04/18/11 0007  . DISCONTD: ondansetron (ZOFRAN) injection 4 mg  4 mg Intravenous Q6H PRN Kandis Cocking, MD   4 mg at 04/17/11 0252  . DISCONTD: piperacillin-tazobactam (ZOSYN) IVPB 3.375 g  3.375 g Intravenous Q8H Mariella Saa, MD   3.375 g at 04/21/11 9604    Assessment/Plan Diverticulitis with abscess. Patient Active Problem List  Diagnoses  . Sjoegren syndrome  . Asthma  We are going to ask IR to evaluate for a Percutaneous drain. Continue antibiotics.  She notes she has not ate since lunch yesterday, except for repeat contrast for CT.   LOS: 1 day    Iver Miklas 04/22/2011

## 2011-04-22 NOTE — Progress Notes (Signed)
I spoke with Dr. Bonnielee Haff. There is no safe window to percutaneously drain her diverticular abscess. Plan sigmoid colectomy with colostomy. Discussed with patient and husband. Risks outlined in detail. They agree with plan. All questions answered. Annette Gentry Mention 04/22/2011 8:38 AM

## 2011-04-22 NOTE — Progress Notes (Signed)
Agree with above note. Abscess not drainable . Will need Hartmann resection.

## 2011-04-22 NOTE — ED Notes (Signed)
Pt awaiting CT scan. ABC's intact. No distress noted.

## 2011-04-22 NOTE — ED Notes (Signed)
OR transport here to get pt.

## 2011-04-22 NOTE — Anesthesia Postprocedure Evaluation (Signed)
Anesthesia Post Note  Patient: Annette Gentry  Procedure(s) Performed:  COLON RESECTION - colon resection, drainage of abscess; COLOSTOMY  Anesthesia type: General  Patient location: PACU  Post pain: Pain level controlled  Post assessment: Post-op Vital signs reviewed  Last Vitals:  Filed Vitals:   04/22/11 1345  BP: 119/73  Pulse: 67  Temp: 37.3 C  Resp: 15    Post vital signs: Reviewed  Level of consciousness: sedated  Complications: No apparent anesthesia complications

## 2011-04-22 NOTE — ED Notes (Signed)
MD at bedside. Dr. Derrell Lolling at bedside.

## 2011-04-22 NOTE — Op Note (Signed)
Patient Name:   Annette Gentry  Date of Surgery:   04/22/2011  Pre op Diagnosis:   Ruptured sigmoid diverticulitis with abscess  Post op Diagnosis:   Ruptured sigmoid diverticulitis with abscess  Procedure:    Exploratory laparotomy, sigmoid colectomy with end colostomy, drainage of abscess  Surgeon:   Angelia Mould. Derrell Lolling, M.D., FACS  Assistant:   none  Operative Indications:   This is a 55 year old Caucasian female with Sjogren's syndrome. She was admitted to this hospital a week ago with acute diverticulitis and microperforation. She responded very nicely to antibiotics with resumption of normal bowel function, normalization of her WBC, and absence of tenderness or mass. She was discharged home yesterday but developed pain and chills last night. She came back to the emergency room this morning where CT scan showed increasing inflammatory changes around the mid sigmoid colon and an abscess which was not drainable by interventional radiology. She was started on antibiotics and advised to undergo surgery. She is brought to the operating room urgently.  Operative Findings:   There was a baseball-sized inflammatory mass of the mid sigmoid colon. The right fallopian tube and ovary were densely adherent to this. A loop of small bowel was dissected here to this and was secondarily inflamed. There was no evidence of any perforation of the small bowel. The rest of the small bowel and the rest of the colon felt soft and normal. She had a very large uterine fibroid. There was a small abscess.  Procedure in Detail:   Following the induction of general endotracheal anesthesia, a Foley catheter was inserted. An oral gastric tube was inserted. The abdomen was prepped and draped in a sterile fashion. Antibiotics had been given earlier in the emergency room. Surgical time out was held.  A lower midline incision was made. The fascia was incised in the midline. The abdomen was entered and explored with findings  as described above. We mobilized the sigmoid colon by dividing its lateral peritoneal attachments. We dissected the colon away from the adherent small bowel and right fallopian tube and ovary. We divided the colon several centimeters proximal and several centimeters distal to the inflammatory mass with the GIA stapling device. We had a fairly long segment of distal colon, transecting the distal colon at least 10 cm above the peritoneal reflection. The rectal stump was sutured to the right pelvic sidewall with Vicryl sutures to hold it up. Mesentery was divided with the LigaSure device. Suture was placed in the distal margin and the specimen sent for pathologic exam. The abdomen was copiously irrigated with saline, approximately 3 Liters.    A suitable site for the colostomy was found in the left abdominal wall midway between the umbilicus and the anterior superior iliac spine. A circular button of skin was excised and the fat debrided. The fascia was incised in a cruciate fashion and the posterior sheath was incised and this was dilated to a slightly larger than 2 fingers. The colon was carefully position so as not to twist it and then brought through the tunnel  in the abdominal wall. The colon was pink and viable.  I placed several  sheets of up Seprafilm in the pelvis and on top of the omentum. The fascia was closed with a running suture of double-stranded, #1 PDS and the skin was closed loosely with a few staples but mostly packed open with Telfa wicks.  I matured the colostomy with multiple sutures of 3-0 Vicryl. The colostomy bled freely and was very  pink and viable. Colostomy maturation looked good and a colostomy bag was placed. A bandage placed on the midline wound. The patient tolerated the procedure well and was taken to recovery room in stable condition. EBL was 150 cc. Complications none. Counts were correct.   Annette Gentry 04/22/2011 1:06 PM

## 2011-04-22 NOTE — Transfer of Care (Signed)
Immediate Anesthesia Transfer of Care Note  Patient: Annette Gentry  Procedure(s) Performed:  COLON RESECTION - colon resection, drainage of abscess; COLOSTOMY  Patient Location: PACU  Anesthesia Type: General  Level of Consciousness: awake and alert   Airway & Oxygen Therapy: Patient Spontanous Breathing and Patient connected to face mask oxygen  Post-op Assessment: Report given to PACU RN and Post -op Vital signs reviewed and stable  Post vital signs: Reviewed and stable  Complications: No apparent anesthesia complications

## 2011-04-22 NOTE — ED Notes (Signed)
Patient transported to CT 

## 2011-04-22 NOTE — Anesthesia Preprocedure Evaluation (Signed)
Anesthesia Evaluation  Patient identified by MRN, date of birth, ID band Patient awake    Reviewed: Allergy & Precautions, H&P , NPO status , Patient's Chart, lab work & pertinent test results  Airway Mallampati: II TM Distance: >3 FB Neck ROM: Full    Dental No notable dental hx. (+) Teeth Intact and Dental Advisory Given   Pulmonary neg pulmonary ROS, asthma (Stable) ,  clear to auscultation  Pulmonary exam normal       Cardiovascular Exercise Tolerance: Good neg cardio ROS + Valvular Problems/Murmurs AS Regular Normal    Neuro/Psych Negative Neurological ROS  Negative Psych ROS   GI/Hepatic negative GI ROS, Neg liver ROS, Abdominal abscess with histry perforation. Oral contrast at 0300   Endo/Other  Negative Endocrine ROS  Renal/GU negative Renal ROS  Genitourinary negative   Musculoskeletal negative musculoskeletal ROS (+)   Abdominal   Peds negative pediatric ROS (+)  Hematology negative hematology ROS (+)   Anesthesia Other Findings   Reproductive/Obstetrics negative OB ROS                           Anesthesia Physical Anesthesia Plan  ASA: II and Emergent  Anesthesia Plan: General   Post-op Pain Management:    Induction: Intravenous, Rapid sequence and Cricoid pressure planned  Airway Management Planned: Oral ETT  Additional Equipment:   Intra-op Plan:   Post-operative Plan:   Informed Consent: I have reviewed the patients History and Physical, chart, labs and discussed the procedure including the risks, benefits and alternatives for the proposed anesthesia with the patient or authorized representative who has indicated his/her understanding and acceptance.   Dental advisory given  Plan Discussed with: CRNA  Anesthesia Plan Comments:         Anesthesia Quick Evaluation

## 2011-04-22 NOTE — H&P (Signed)
Reason for Consult:Diverticular abscess Referring Physician: [name] is an 55 y.o. female.  ZOX:WRUE patient is known to the general surgery service for her recent admission and hospitalization in for suspected perforated diverticulitis with free air. She had free air on her initial CT scan and presentation approximately 5 days ago on November 15. She was treated with nonoperative management and antibiotic therapy and she had been doing well and was discharged from the hospital yesterday on the 20th. A few hours after getting home, she again began having similar pain to her prior admission and she states that this progressed and she had associated chills and some nausea with one episode of vomiting in the emergency room.her symptoms seemed to improve after throwing up in the emergency room and after having a small, loose bowel movement. She came back to the emergency room for evaluation after her husband called me explain the situation and I recommended repeat evaluation and CT scan. Repeat CT scan in the emergency room revealed inflammatory changes around the sigmoid colon with possible developing abscess in the pericolonic area. Please refer to prior history and physical from the 15th for additional details.  Past Medical History  Diagnosis Date  . Heart murmur   . Asthma   uterine fibroids Sjogren's syndrome diverticulitis  Past Surgical History  Procedure Date  . Hernia repair    Famhx: No family history on file. 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.  Husband with patient.  62 YO son.   Social History:  reports that she has never smoked. She does not have any smokeless tobacco history on file. She reports that she drinks about 4.2 ounces of alcohol per week. She reports that she does not use illicit drugs.  Allergies:  Allergies  Allergen Reactions  . Codeine Nausea Only   All other review of systems negative or noncontributory except as stated in the HPI   Medications: I  have reviewed the patient's current medications.  Results for orders placed during the hospital encounter of 04/21/11 (from the past 48 hour(s))  CBC     Status: Abnormal   Collection Time   04/21/11 11:24 PM      Component Value Range Comment   WBC 15.3 (*) 4.0 - 10.5 (K/uL)    RBC 4.30  3.87 - 5.11 (MIL/uL)    Hemoglobin 13.4  12.0 - 15.0 (g/dL)    HCT 45.4  09.8 - 11.9 (%)    MCV 90.0  78.0 - 100.0 (fL)    MCH 31.2  26.0 - 34.0 (pg)    MCHC 34.6  30.0 - 36.0 (g/dL)    RDW 14.7  82.9 - 56.2 (%)    Platelets 242  150 - 400 (K/uL)   DIFFERENTIAL     Status: Abnormal   Collection Time   04/21/11 11:24 PM      Component Value Range Comment   Neutrophils Relative 93 (*) 43 - 77 (%)    Neutro Abs 14.1 (*) 1.7 - 7.7 (K/uL)    Lymphocytes Relative 3 (*) 12 - 46 (%)    Lymphs Abs 0.5 (*) 0.7 - 4.0 (K/uL)    Monocytes Relative 4  3 - 12 (%)    Monocytes Absolute 0.7  0.1 - 1.0 (K/uL)    Eosinophils Relative 0  0 - 5 (%)    Eosinophils Absolute 0.0  0.0 - 0.7 (K/uL)    Basophils Relative 0  0 - 1 (%)    Basophils Absolute 0.0  0.0 -  0.1 (K/uL)   COMPREHENSIVE METABOLIC PANEL     Status: Abnormal   Collection Time   04/21/11 11:24 PM      Component Value Range Comment   Sodium 135  135 - 145 (mEq/L)    Potassium 3.3 (*) 3.5 - 5.1 (mEq/L)    Chloride 101  96 - 112 (mEq/L)    CO2 22  19 - 32 (mEq/L)    Glucose, Bld 126 (*) 70 - 99 (mg/dL)    BUN 12  6 - 23 (mg/dL)    Creatinine, Ser 1.61  0.50 - 1.10 (mg/dL)    Calcium 9.9  8.4 - 10.5 (mg/dL)    Total Protein 7.3  6.0 - 8.3 (g/dL)    Albumin 3.6  3.5 - 5.2 (g/dL)    AST 14  0 - 37 (U/L)    ALT 12  0 - 35 (U/L)    Alkaline Phosphatase 40  39 - 117 (U/L)    Total Bilirubin 0.5  0.3 - 1.2 (mg/dL)    GFR calc non Af Amer 76 (*) >90 (mL/min)    GFR calc Af Amer 88 (*) >90 (mL/min)   LIPASE, BLOOD     Status: Normal   Collection Time   04/21/11 11:24 PM      Component Value Range Comment   Lipase 26  11 - 59 (U/L)   URINALYSIS,  ROUTINE W REFLEX MICROSCOPIC     Status: Abnormal   Collection Time   04/22/11  1:54 AM      Component Value Range Comment   Color, Urine YELLOW  YELLOW     Appearance CLEAR  CLEAR     Specific Gravity, Urine 1.020  1.005 - 1.030     pH 5.0  5.0 - 8.0     Glucose, UA NEGATIVE  NEGATIVE (mg/dL)    Hgb urine dipstick SMALL (*) NEGATIVE     Bilirubin Urine NEGATIVE  NEGATIVE     Ketones, ur TRACE (*) NEGATIVE (mg/dL)    Protein, ur NEGATIVE  NEGATIVE (mg/dL)    Urobilinogen, UA 1.0  0.0 - 1.0 (mg/dL)    Nitrite NEGATIVE  NEGATIVE     Leukocytes, UA NEGATIVE  NEGATIVE    URINE MICROSCOPIC-ADD ON     Status: Abnormal   Collection Time   04/22/11  1:54 AM      Component Value Range Comment   Squamous Epithelial / LPF FEW (*) RARE     WBC, UA 0-2  <3 (WBC/hpf)    RBC / HPF 0-2  <3 (RBC/hpf)    Bacteria, UA RARE  RARE     Urine-Other MUCOUS PRESENT       Ct Abdomen Pelvis W Contrast  04/22/2011  *RADIOLOGY REPORT*  Clinical Data: Abdominal pain, recent diverticulitis.  CT ABDOMEN AND PELVIS WITH CONTRAST  Technique:  Multidetector CT imaging of the abdomen and pelvis was performed following the standard protocol during bolus administration of intravenous contrast.  Contrast: OMNIPAQUE IOHEXOL 300 MG/ML IV SOLN  Comparison: 04/16/2011  Findings: Mild bibasilar opacities are favored to represent atelectasis.  Unremarkable liver, biliary system, spleen, pancreas, adrenal glands, kidneys.  No hydronephrosis or hydroureter.  There is marked thickening and pericolonic stranding of a segment of sigmoid colon in keeping with acute diverticulitis that has progressed in the interval. Ill-defined extraluminal fluid and air collection has developed adjacent to the inflamed segment in the interval.  This collection measures approximately 4.2 x 4.1 cm. The inflammatory process is inseparable from  an adjacent loop of small bowel which demonstrates likely reactive wall thickening.  No bowel obstruction.   Normal appendix.  Decreased free intraperitoneal air in the interval.  Normal caliber vasculature.  Fibroid uterus.  Mild circumferential bladder wall thickening is nonspecific.  Small amount of free fluid within the pelvis.  No adnexal mass.  No acute osseous abnormality. L5 sclerotic focus is likely a bone island.  IMPRESSION: Interval progression of sigmoid colon diverticulitis with increased pericolonic fat stranding and development of an ill-defined air and fluid collection, in keeping with contained perforation and phlegmonous changes.  No defined wall to the collection at this time.  Discussed via telephone with Dr. Lynelle Doctor at 04:10 a.m. on 04/22/2011.  Original Report Authenticated By: Waneta Martins, M.D.   Dg Abd Acute W/chest  04/21/2011  *RADIOLOGY REPORT*  Clinical Data: Abdominal pain  ACUTE ABDOMEN SERIES (ABDOMEN 2 VIEW & CHEST 1 VIEW)  Comparison: 04/16/2011 CT  Findings: Mild bibasilar atelectasis.  Otherwise lungs are clear. Cardiomediastinal contours within normal limits.  Nonobstructive bowel gas pattern.  High attenuation of the colon is in keeping with previously ingested contrast.  The previously noted free intraperitoneal air has predominately resolved.  There may be mild residual along the undersurface of the mid diaphragm.  IMPRESSION: Nonobstructive bowel gas pattern.  Decreased free intraperitoneal air.  Original Report Authenticated By: Waneta Martins, M.D.    @ROS @ Blood pressure 104/57, pulse 85, temperature 99.5 F (37.5 C), temperature source Oral, resp. rate 100, last menstrual period 04/18/2011, SpO2 96.00%. General appearance: alert, cooperative and no distress Head: Normocephalic, without obvious abnormality, atraumatic Neck: no adenopathy and supple, symmetrical, trachea midline Resp: clear to auscultation bilaterally Cardio: regular rate and rhythm, S1, S2 normal, no murmur, click, rub or gallop GI: soft,minimal rlq and periumbilical pain with deep  palpation, nondistended, no peritoneal signs Extremities: extremities normal, atraumatic, no cyanosis or edema Pulses: 2+ and symmetric Skin: Skin color, texture, turgor normal. No rashes or lesions Neurologic: Grossly normal  Assessment/Plan: Sigmoid diverticulitis with developing abscess and phlegmon  This is most likely a continuation of her prior sigmoid diverticulitis with now developing abscess. She has very benign abdominal exam and has minimal periumbilical tenderness with no evidence of peritonitis. I reviewed her CT scan and discuss with her the treatment options of continued antibiotic management with possible percutaneous drainage if possible versus surgical intervention with sigmoid colectomy drainage of abscess and likely colostomy. I recommended inpatient admission with continued n.p.o. And IV antibiotics and plan to ask the interventional radiologist to evaluate and see if percutaneous drainage of this developing fluid collection would be possible. We'll plan for n.p.o. And she will likely need TPN given her are a prolonged n.p.o. Status with her prior hospitalization. If she has increasing pain, she may need surgical management or if this is not able to be managed with nonoperative treatment then we would certainly consider surgical management.  Lodema Pilot DAVID 04/22/2011, 5:50 AM

## 2011-04-23 ENCOUNTER — Encounter (HOSPITAL_COMMUNITY): Payer: Self-pay | Admitting: Surgery

## 2011-04-23 ENCOUNTER — Other Ambulatory Visit (INDEPENDENT_AMBULATORY_CARE_PROVIDER_SITE_OTHER): Payer: Self-pay | Admitting: Surgery

## 2011-04-23 DIAGNOSIS — Z860100 Personal history of colon polyps, unspecified: Secondary | ICD-10-CM

## 2011-04-23 HISTORY — DX: Personal history of colonic polyps: Z86.010

## 2011-04-23 HISTORY — DX: Personal history of colon polyps, unspecified: Z86.0100

## 2011-04-23 LAB — BASIC METABOLIC PANEL
BUN: 6 mg/dL (ref 6–23)
CO2: 23 mEq/L (ref 19–32)
Chloride: 105 mEq/L (ref 96–112)
GFR calc non Af Amer: >90 mL/min (ref >90)
Glucose, Bld: 167 mg/dL — ABNORMAL HIGH (ref 70–99)
Potassium: 3.6 mEq/L (ref 3.5–5.1)

## 2011-04-23 LAB — CBC
HCT: 35.6 % — ABNORMAL LOW (ref 36.0–46.0)
Hemoglobin: 11.8 g/dL — ABNORMAL LOW (ref 12.0–15.0)
MCH: 30.4 pg (ref 26.0–34.0)
MCHC: 33.1 g/dL (ref 30.0–36.0)
MCV: 91.8 fL (ref 78.0–100.0)

## 2011-04-23 MED ORDER — TAB-A-VITE/IRON PO TABS
1.0000 | ORAL_TABLET | Freq: Every day | ORAL | Status: DC
  Administered 2011-04-24 – 2011-04-28 (×5): 1 via ORAL
  Filled 2011-04-23 (×6): qty 1

## 2011-04-23 MED ORDER — ACETAMINOPHEN 325 MG PO TABS: 325.0000 mg | ORAL_TABLET | Freq: Four times a day (QID) | ORAL | Status: DC | PRN

## 2011-04-23 MED ORDER — PSYLLIUM 95 % PO PACK
1.0000 | PACK | Freq: Two times a day (BID) | ORAL | Status: DC
  Administered 2011-04-24 – 2011-04-28 (×4): 1 via ORAL
  Filled 2011-04-23 (×14): qty 1

## 2011-04-23 MED ORDER — ACETAMINOPHEN 650 MG RE SUPP
650.0000 mg | Freq: Four times a day (QID) | RECTAL | Status: DC | PRN
Start: 2011-04-23 — End: 2011-04-28

## 2011-04-23 MED ORDER — MORPHINE SULFATE 2 MG/ML IJ SOLN
2.0000 mg | INTRAMUSCULAR | Status: DC | PRN
  Administered 2011-04-23 (×4): 2 mg via INTRAVENOUS
  Filled 2011-04-23 (×8): qty 1

## 2011-04-23 MED ORDER — PROMETHAZINE HCL 25 MG/ML IJ SOLN: 12.5000 mg | Freq: Four times a day (QID) | INTRAMUSCULAR | Status: DC | PRN

## 2011-04-23 MED ORDER — FLORA-Q PO CAPS
1.0000 | ORAL_CAPSULE | Freq: Every day | ORAL | Status: DC
  Administered 2011-04-24 – 2011-04-28 (×5): 1 via ORAL
  Filled 2011-04-23 (×6): qty 1

## 2011-04-23 MED ORDER — ALUM & MAG HYDROXIDE-SIMETH 400-400-40 MG/5ML PO SUSP
30.0000 mL | Freq: Four times a day (QID) | ORAL | Status: DC | PRN
  Filled 2011-04-23: qty 30

## 2011-04-23 NOTE — Progress Notes (Signed)
PCP: No primary provider on file.  Outpatient Care Team: Patient has no care team.  Inpatient Treatment Team: Treatment Team: Respiratory Therapist: Veryl Speak, RRT; Registered Nurse: Gloriajean Dell, RN; Technician: Fabio Asa, NT; Registered Nurse: Armanda Heritage, RN; Technician: Regino Schultze Cox, NT; Respiratory Therapist: Terie Purser Closson, RRT   LOS: 2 days   1 Day Post-Op  Procedure(s): COLON RESECTION COLOSTOMY  Subjective:  Mildly groggy Pain minimal Walking in room  Objective:  Vital signs:  Temp:  [97.8 F (36.6 C)-99.3 F (37.4 C)] 99.3 F (37.4 C) (11/22 0510) Pulse Rate:  [62-88] 88  (11/22 0510) Resp:  [10-16] 16  (11/22 0510) BP: (98-127)/(57-74) 104/67 mmHg (11/22 0510) SpO2:  [96 %-100 %] 98 % (11/22 0510) Weight:  [145 lb (65.772 kg)] 145 lb (65.772 kg) (11/21 1849) Last BM Date: 04/21/11  Intake/Output    from previous day: 11/21 0701 - 11/22 0700 In: 5220.8 [I.V.:5220.8] Out: 1600 [Urine:1600]  this shift: Total I/O In: -  Out: 850 [Urine:850]  Flatus: no BM: no  Physical Exam:  General: Pt awake/alert/oriented x4 in no acute distress Eyes: PERRL, normal EOM.  Sclera clear.  No icterus Neuro: CN II-XII intact w/o focal sensory/motor deficits. Lymph: No head/neck/groin lymphadenopathy Psych:  No delerium/psychosis/paranoia HENT: Normocephalic, Mucus membranes moist.  No thrush Neck: Supple, No tracheal deviation Chest: Clear lungs. No pain w good excursion CV:  Pulses intact.  Regular rhythm Abdomen: Soft, Nontender/Nondistended.  No incarcerated hernias.  Colostomy mildly dusky Ext:  SCDs BLE.  No mjr edema.  No cyanosis Skin: No petechiae / purpurae  Results:   Labs: Results for orders placed during the hospital encounter of 04/21/11 (from the past 48 hour(s))  CBC     Status: Abnormal   Collection Time   04/21/11 11:24 PM      Component Value Range Comment   WBC 15.3 (*) 4.0 - 10.5 (K/uL)    RBC 4.30  3.87 - 5.11 (MIL/uL)    Hemoglobin 13.4  12.0 - 15.0 (g/dL)    HCT 16.1  09.6 - 04.5 (%)    MCV 90.0  78.0 - 100.0 (fL)    MCH 31.2  26.0 - 34.0 (pg)    MCHC 34.6  30.0 - 36.0 (g/dL)    RDW 40.9  81.1 - 91.4 (%)    Platelets 242  150 - 400 (K/uL)   DIFFERENTIAL     Status: Abnormal   Collection Time   04/21/11 11:24 PM      Component Value Range Comment   Neutrophils Relative 93 (*) 43 - 77 (%)    Neutro Abs 14.1 (*) 1.7 - 7.7 (K/uL)    Lymphocytes Relative 3 (*) 12 - 46 (%)    Lymphs Abs 0.5 (*) 0.7 - 4.0 (K/uL)    Monocytes Relative 4  3 - 12 (%)    Monocytes Absolute 0.7  0.1 - 1.0 (K/uL)    Eosinophils Relative 0  0 - 5 (%)    Eosinophils Absolute 0.0  0.0 - 0.7 (K/uL)    Basophils Relative 0  0 - 1 (%)    Basophils Absolute 0.0  0.0 - 0.1 (K/uL)   COMPREHENSIVE METABOLIC PANEL     Status: Abnormal   Collection Time   04/21/11 11:24 PM      Component Value Range Comment   Sodium 135  135 - 145 (mEq/L)    Potassium 3.3 (*) 3.5 - 5.1 (mEq/L)    Chloride 101  96 - 112 (mEq/L)    CO2 22  19 - 32 (mEq/L)    Glucose, Bld 126 (*) 70 - 99 (mg/dL)    BUN 12  6 - 23 (mg/dL)    Creatinine, Ser 4.78  0.50 - 1.10 (mg/dL)    Calcium 9.9  8.4 - 10.5 (mg/dL)    Total Protein 7.3  6.0 - 8.3 (g/dL)    Albumin 3.6  3.5 - 5.2 (g/dL)    AST 14  0 - 37 (U/L)    ALT 12  0 - 35 (U/L)    Alkaline Phosphatase 40  39 - 117 (U/L)    Total Bilirubin 0.5  0.3 - 1.2 (mg/dL)    GFR calc non Af Amer 76 (*) >90 (mL/min)    GFR calc Af Amer 88 (*) >90 (mL/min)   LIPASE, BLOOD     Status: Normal   Collection Time   04/21/11 11:24 PM      Component Value Range Comment   Lipase 26  11 - 59 (U/L)   URINALYSIS, ROUTINE W REFLEX MICROSCOPIC     Status: Abnormal   Collection Time   04/22/11  1:54 AM      Component Value Range Comment   Color, Urine YELLOW  YELLOW     Appearance CLEAR  CLEAR     Specific Gravity, Urine 1.020  1.005 - 1.030     pH 5.0  5.0 - 8.0     Glucose, UA NEGATIVE   NEGATIVE (mg/dL)    Hgb urine dipstick SMALL (*) NEGATIVE     Bilirubin Urine NEGATIVE  NEGATIVE     Ketones, ur TRACE (*) NEGATIVE (mg/dL)    Protein, ur NEGATIVE  NEGATIVE (mg/dL)    Urobilinogen, UA 1.0  0.0 - 1.0 (mg/dL)    Nitrite NEGATIVE  NEGATIVE     Leukocytes, UA NEGATIVE  NEGATIVE    URINE MICROSCOPIC-ADD ON     Status: Abnormal   Collection Time   04/22/11  1:54 AM      Component Value Range Comment   Squamous Epithelial / LPF FEW (*) RARE     WBC, UA 0-2  <3 (WBC/hpf)    RBC / HPF 0-2  <3 (RBC/hpf)    Bacteria, UA RARE  RARE     Urine-Other MUCOUS PRESENT     CBC     Status: Abnormal   Collection Time   04/22/11  7:04 PM      Component Value Range Comment   WBC 12.5 (*) 4.0 - 10.5 (K/uL)    RBC 3.84 (*) 3.87 - 5.11 (MIL/uL)    Hemoglobin 11.8 (*) 12.0 - 15.0 (g/dL)    HCT 29.5 (*) 62.1 - 46.0 (%)    MCV 91.4  78.0 - 100.0 (fL)    MCH 30.7  26.0 - 34.0 (pg)    MCHC 33.6  30.0 - 36.0 (g/dL)    RDW 30.8  65.7 - 84.6 (%)    Platelets 220  150 - 400 (K/uL)   CREATININE, SERUM     Status: Normal   Collection Time   04/22/11  7:04 PM      Component Value Range Comment   Creatinine, Ser 0.75  0.50 - 1.10 (mg/dL)    GFR calc non Af Amer >90  >90 (mL/min)    GFR calc Af Amer >90  >90 (mL/min)   BASIC METABOLIC PANEL     Status: Abnormal   Collection Time   04/23/11  3:45 AM  Component Value Range Comment   Sodium 135  135 - 145 (mEq/L)    Potassium 3.6  3.5 - 5.1 (mEq/L)    Chloride 105  96 - 112 (mEq/L)    CO2 23  19 - 32 (mEq/L)    Glucose, Bld 167 (*) 70 - 99 (mg/dL)    BUN 6  6 - 23 (mg/dL)    Creatinine, Ser 1.30  0.50 - 1.10 (mg/dL)    Calcium 9.1  8.4 - 10.5 (mg/dL)    GFR calc non Af Amer >90  >90 (mL/min)    GFR calc Af Amer >90  >90 (mL/min)   CBC     Status: Abnormal   Collection Time   04/23/11  3:45 AM      Component Value Range Comment   WBC 9.8  4.0 - 10.5 (K/uL)    RBC 3.88  3.87 - 5.11 (MIL/uL)    Hemoglobin 11.8 (*) 12.0 - 15.0 (g/dL)     HCT 86.5 (*) 78.4 - 46.0 (%)    MCV 91.8  78.0 - 100.0 (fL)    MCH 30.4  26.0 - 34.0 (pg)    MCHC 33.1  30.0 - 36.0 (g/dL)    RDW 69.6  29.5 - 28.4 (%)    Platelets 206  150 - 400 (K/uL)     Imaging / Studies: @RISRSLT24 @  Antibiotics: Anti-infectives     Start     Dose/Rate Route Frequency Ordered Stop   04/23/11 1000   ertapenem (INVANZ) 1 g in sodium chloride 0.9 % 50 mL IVPB        1 g 100 mL/hr over 30 Minutes Intravenous Every 24 hours 04/22/11 1337     04/22/11 1100   piperacillin-tazobactam (ZOSYN) IVPB 3.375 g  Status:  Discontinued        3.375 g 12.5 mL/hr over 240 Minutes Intravenous 3 times per day 04/22/11 0417 04/22/11 1337   04/22/11 0845   ertapenem (INVANZ) 1 g in sodium chloride 0.9 % 50 mL IVPB        1 g 100 mL/hr over 30 Minutes Intravenous Every 24 hours 04/22/11 0838 04/22/11 1007   04/22/11 0430  piperacillin-tazobactam (ZOSYN) 3.375 g in dextrose 5 % 50 mL IVPB       3.375 g 100 mL/hr over 30 Minutes Intravenous To Emergency Dept 04/22/11 0411 04/22/11 0502          Medications / Allergies: per chart  Assessment / Plan: Annette Gentry  55 y.o. female  Procedure(s): COLON RESECTION COLOSTOMY  Problem List:  Principal Problem:  *Diverticulitis of colon with perforation  -sips  -IV ABx  -f/u path.  Had polyps on colonoscopy ~2009 -VTE prophylaxis- SCDs, etc -mobilize as tolerated to help recovery  Lorenso Courier, M.D., F.A.C.S. Gastrointestinal and Minimally Invasive Surgery [facility] [address] [place], [place] [address] [phone] Main / Paging [phone] Voice Mail   04/23/2011

## 2011-04-24 MED ORDER — ZOLPIDEM TARTRATE 5 MG PO TABS: 5.0000 mg | ORAL_TABLET | Freq: Every evening | ORAL | Status: DC | PRN

## 2011-04-24 MED ORDER — DIPHENHYDRAMINE HCL 25 MG PO CAPS: 25.0000 mg | ORAL_CAPSULE | Freq: Four times a day (QID) | ORAL | Status: DC | PRN

## 2011-04-24 MED ORDER — NAPROXEN 500 MG PO TABS
500.0000 mg | ORAL_TABLET | Freq: Two times a day (BID) | ORAL | Status: DC
  Administered 2011-04-25 – 2011-04-28 (×6): 500 mg via ORAL
  Filled 2011-04-24 (×10): qty 1

## 2011-04-24 MED ORDER — DIPHENHYDRAMINE HCL 50 MG/ML IJ SOLN: 12.5000 mg | Freq: Four times a day (QID) | INTRAMUSCULAR | Status: DC | PRN

## 2011-04-24 NOTE — Progress Notes (Signed)
Pt.'s abdominal mid line dsg changed as ordered. Small amount of blood noted on old dressing. Pt. Has been ambulating in the hall x3 with her husband, and tolerated well. Ostomy nurse in to teach her about her new ostomy.

## 2011-04-24 NOTE — Progress Notes (Signed)
PCP: Hollice Espy, MD  Outpatient Care Team: Patient Care Team: Hollice Espy as PCP - General (Family Medicine) Barrie Folk, MD as Consulting Physician (Gastroenterology)  Inpatient Treatment Team: Treatment Team: Attending Provider: Md Ccs; Registered Nurse: Gloriajean Dell, RN; Technician: Fabio Asa, NT; Technician: Lanna Poche, NT; Respiratory Therapist: Tyna Jaksch, RRT; Technician: Gwynneth Albright, NT; Respiratory Therapist: Antoine Poche, RRT; Technician: Christin Mordecai Maes, NT; Registered Nurse: Karma Greaser, RN; Technician: Westley Chandler, NT   LOS: 3 days   2 Days Post-Op  Procedure(s): COLON RESECTION COLOSTOMY  Subjective:  Tired a little but o/w OK Husband at bedside  Objective:  Vital signs:  Temp:  [98.4 F (36.9 C)-99.5 F (37.5 C)] 99 F (37.2 C) (11/23 0530) Pulse Rate:  [77-90] 77  (11/23 0530) Resp:  [16] 16  (11/23 0530) BP: (99-136)/(65-85) 103/65 mmHg (11/23 0530) SpO2:  [95 %-97 %] 95 % (11/23 0530) Last BM Date: 04/21/11  Intake/Output    from previous day: 11/22 0701 - 11/23 0700 In: 1213.3 [P.O.:350; I.V.:863.3] Out: 1750 [Urine:1750]  this shift:    Flatus: no BM: no  Physical Exam:  General: Pt awake/alert/oriented x4 in no acute distress Eyes: PERRL, normal EOM.  Sclera clear.  No icterus Neuro: CN II-XII intact w/o focal sensory/motor deficits. Lymph: No head/neck/groin lymphadenopathy Psych:  No delerium/psychosis/paranoia HENT: Normocephalic, Mucus membranes moist.  No thrush Neck: Supple, No tracheal deviation Chest: Clear.  No chest wall pain w good excursion CV:  Pulses intact.  Regular rhythm Abdomen: Soft, Minimally tender/Nondistended.  No incarcerated hernias.  Incision partially closed.  Wound clean - i repacked Ext:  SCDs BLE.  No mjr edema.  No cyanosis Skin: No petechiae / purpurae  Results:   Labs: Results for orders placed during the hospital  encounter of 04/21/11 (from the past 48 hour(s))  CBC     Status: Abnormal   Collection Time   04/22/11  7:04 PM      Component Value Range Comment   WBC 12.5 (*) 4.0 - 10.5 (K/uL)    RBC 3.84 (*) 3.87 - 5.11 (MIL/uL)    Hemoglobin 11.8 (*) 12.0 - 15.0 (g/dL)    HCT 78.2 (*) 95.6 - 46.0 (%)    MCV 91.4  78.0 - 100.0 (fL)    MCH 30.7  26.0 - 34.0 (pg)    MCHC 33.6  30.0 - 36.0 (g/dL)    RDW 21.3  08.6 - 57.8 (%)    Platelets 220  150 - 400 (K/uL)   CREATININE, SERUM     Status: Normal   Collection Time   04/22/11  7:04 PM      Component Value Range Comment   Creatinine, Ser 0.75  0.50 - 1.10 (mg/dL)    GFR calc non Af Amer >90  >90 (mL/min)    GFR calc Af Amer >90  >90 (mL/min)   BASIC METABOLIC PANEL     Status: Abnormal   Collection Time   04/23/11  3:45 AM      Component Value Range Comment   Sodium 135  135 - 145 (mEq/L)    Potassium 3.6  3.5 - 5.1 (mEq/L)    Chloride 105  96 - 112 (mEq/L)    CO2 23  19 - 32 (mEq/L)    Glucose, Bld 167 (*) 70 - 99 (mg/dL)    BUN 6  6 - 23 (mg/dL)    Creatinine, Ser 4.69  0.50 - 1.10 (mg/dL)  Calcium 9.1  8.4 - 10.5 (mg/dL)    GFR calc non Af Amer >90  >90 (mL/min)    GFR calc Af Amer >90  >90 (mL/min)   CBC     Status: Abnormal   Collection Time   04/23/11  3:45 AM      Component Value Range Comment   WBC 9.8  4.0 - 10.5 (K/uL)    RBC 3.88  3.87 - 5.11 (MIL/uL)    Hemoglobin 11.8 (*) 12.0 - 15.0 (g/dL)    HCT 40.9 (*) 81.1 - 46.0 (%)    MCV 91.8  78.0 - 100.0 (fL)    MCH 30.4  26.0 - 34.0 (pg)    MCHC 33.1  30.0 - 36.0 (g/dL)    RDW 91.4  78.2 - 95.6 (%)    Platelets 206  150 - 400 (K/uL)     Imaging / Studies: @RISRSLT24 @  Antibiotics: Anti-infectives     Start     Dose/Rate Route Frequency Ordered Stop   04/23/11 1000   ertapenem (INVANZ) 1 g in sodium chloride 0.9 % 50 mL IVPB        1 g 100 mL/hr over 30 Minutes Intravenous Every 24 hours 04/22/11 1337     04/22/11 1100   piperacillin-tazobactam (ZOSYN) IVPB 3.375  g  Status:  Discontinued        3.375 g 12.5 mL/hr over 240 Minutes Intravenous 3 times per day 04/22/11 0417 04/22/11 1337   04/22/11 0845   ertapenem (INVANZ) 1 g in sodium chloride 0.9 % 50 mL IVPB        1 g 100 mL/hr over 30 Minutes Intravenous Every 24 hours 04/22/11 0838 04/22/11 1007   04/22/11 0430  piperacillin-tazobactam (ZOSYN) 3.375 g in dextrose 5 % 50 mL IVPB       3.375 g 100 mL/hr over 30 Minutes Intravenous To Emergency Dept 04/22/11 0411 04/22/11 0502          Medications / Allergies: per chart  Assessment / Plan: Annette Gentry  55 y.o. female  Procedure(s): COLON RESECTION COLOSTOMY  Problem List:  Principal Problem:  *Diverticulitis of colon with perforation  -IV ABx  -thin liq as tolerated.  No adv beyond that until flatus Active Problems:  Personal history of colonic polyps -increase non-narcotic pain control -VTE prophylaxis- SCDs, etc -mobilize as tolerated to help recovery  Lorenso Courier, M.D., F.A.C.S. Gastrointestinal and Minimally Invasive Surgery [facility] [address], [address] [place], [place] [address] [phone] Main / Paging [phone] Voice Mail   04/24/2011

## 2011-04-24 NOTE — Progress Notes (Signed)
Ur review completed  

## 2011-04-24 NOTE — Consult Note (Signed)
WOC ostomy consult  Stoma type/location: new colostomy LLQ Stomal assessment/size: round, budded, moist stoma in LLQ. 1/38 inch round Peristomal assessment: clear, instact Treatment options for stomal/peristomal skin: None needed Output serosanquinous Ostomy pouching: 2pc., 2 and 1/2inch pouching system applied today.  Transparent pouch with integrated gas filter and Lock and Roll closure. Education provided: Patient and spouse taught A&P, pathophysiology, stoma and pouch characteristics, flatus, skin barrier sizing and diet.  Both observed demonstration/simulation of pouch emptying and cleansing of bottom of pouch with a toilet paper "wick" prior to re-sealing. Discussed that plan will be to change pouching system every Tuesday and Friday.  Patient able to give return demo of pouch closing.  Patient education booklet provided. Sample of opaque pouch provided. Recommend HHRN for brief follow up for ostomy teaching reinforcement post discharge.  If you agree, please order. I will follow with you.  i will not see tomorrow, but will see on Sunday. Ladona Mow, MSN, RN, GNP, CWOCN [phone])

## 2011-04-25 LAB — CBC
MCH: 31.3 pg (ref 26.0–34.0)
MCHC: 34.3 g/dL (ref 30.0–36.0)
MCV: 91.2 fL (ref 78.0–100.0)
Platelets: 237 10*3/uL (ref 150–400)
RDW: 12.5 % (ref 11.5–15.5)

## 2011-04-25 MED ORDER — LACTATED RINGERS IV BOLUS (SEPSIS): 1000.0000 mL | Freq: Four times a day (QID) | INTRAVENOUS | Status: AC | PRN

## 2011-04-25 NOTE — Progress Notes (Signed)
PCP: Hollice Espy, MD  Outpatient Care Team: Patient Care Team: Hollice Espy as PCP - General (Family Medicine) Barrie Folk, MD as Consulting Physician (Gastroenterology) [name], MD as Consulting Physician (General Surgery)  Inpatient Treatment Team: Treatment Team: Attending Provider: Md Ccs; Registered Nurse: Gloriajean Dell, RN; Technician: Fabio Asa, NT; Technician: Lanna Poche, NT; Respiratory Therapist: Tyna Jaksch, RRT; Respiratory Therapist: Antoine Poche, RRT; Registered Nurse: Karma Greaser, RN; Registered Nurse: Liborio Nixon, RN   LOS: 4 days   3 Days Post-Op  Procedure(s): COLON RESECTION COLOSTOMY  Subjective:  Feeling better Trying clears OK Walking more  Objective:  Vital signs:  Temp:  [98.5 F (36.9 C)-99.2 F (37.3 C)] 99.2 F (37.3 C) (11/24 0602) Pulse Rate:  [75-92] 82  (11/24 0602) Resp:  [18] 18  (11/24 0602) BP: (108-130)/(69-73) 110/71 mmHg (11/24 0602) SpO2:  [95 %-97 %] 95 % (11/24 0602) Last BM Date: 04/21/11  Intake/Output    from previous day: 11/23 0701 - 11/24 0700 In: 480 [I.V.:480] Out: 1550 [Urine:1550]  this shift:    Flatus: YES BM: no  Physical Exam:  General: Pt awake/alert/oriented x4 in no acute distress Eyes: PERRL, normal EOM.  Sclera clear.  No icterus Neuro: CN II-XII intact w/o focal sensory/motor deficits. Lymph: No head/neck/groin lymphadenopathy Psych:  No delerium/psychosis/paranoia HENT: Normocephalic, Mucus membranes moist.  No thrush Neck: Supple, No tracheal deviation Chest: Clear.  No chest wall pain w good excursion CV:  Pulses intact.  Regular rhythm Abdomen: Soft, Minimally tender/Nondistended.  No incarcerated hernias.  Incision partially closed.  Wound clean. Ext:  SCDs BLE.  No mjr edema.  No cyanosis Skin: No petechiae / purpurae  Results:   Labs: Results for orders placed during the hospital encounter of 04/21/11 (from  the past 48 hour(s))  CBC     Status: Abnormal   Collection Time   04/25/11  3:38 AM      Component Value Range Comment   WBC 5.1  4.0 - 10.5 (K/uL)    RBC 3.77 (*) 3.87 - 5.11 (MIL/uL)    Hemoglobin 11.8 (*) 12.0 - 15.0 (g/dL)    HCT 16.1 (*) 09.6 - 46.0 (%)    MCV 91.2  78.0 - 100.0 (fL)    MCH 31.3  26.0 - 34.0 (pg)    MCHC 34.3  30.0 - 36.0 (g/dL)    RDW 04.5  40.9 - 81.1 (%)    Platelets 237  150 - 400 (K/uL)   POTASSIUM     Status: Abnormal   Collection Time   04/25/11  3:38 AM      Component Value Range Comment   Potassium 3.3 (*) 3.5 - 5.1 (mEq/L)     Imaging / Studies: @RISRSLT24 @  Antibiotics: Anti-infectives     Start     Dose/Rate Route Frequency Ordered Stop   04/23/11 1000   ertapenem (INVANZ) 1 g in sodium chloride 0.9 % 50 mL IVPB        1 g 100 mL/hr over 30 Minutes Intravenous Every 24 hours 04/22/11 1337     04/22/11 1100   piperacillin-tazobactam (ZOSYN) IVPB 3.375 g  Status:  Discontinued        3.375 g 12.5 mL/hr over 240 Minutes Intravenous 3 times per day 04/22/11 0417 04/22/11 1337   04/22/11 0845   ertapenem (INVANZ) 1 g in sodium chloride 0.9 % 50 mL IVPB        1 g 100  mL/hr over 30 Minutes Intravenous Every 24 hours 04/22/11 0838 04/22/11 1007   04/22/11 0430  piperacillin-tazobactam (ZOSYN) 3.375 g in dextrose 5 % 50 mL IVPB       3.375 g 100 mL/hr over 30 Minutes Intravenous To Emergency Dept 04/22/11 0411 04/22/11 0502          Medications / Allergies: per chart  Assessment / Plan: Annette Gentry  55 y.o. female  Procedure(s): COLON RESECTION COLOSTOMY  Problem List:  Principal Problem:  *Diverticulitis of colon with perforation  -path c/w diverticulitis, no cancer - explained to pt  -IV ABx  -thin liq as tolerated.  Adv to fulls as tolerated w flatus  -start bowel regimen Active Problems:  Personal history of colonic polyps -increase non-narcotic pain control -VTE prophylaxis- SCDs, etc -mobilize as tolerated to  help recovery -lower IVF gradually  Lorenso Courier, M.D., F.A.C.S. Gastrointestinal and Minimally Invasive Surgery [facility] [address], [address] [place], [place] [address] [phone] Main / Paging [phone] Voice Mail   04/25/2011

## 2011-04-26 ENCOUNTER — Telehealth (INDEPENDENT_AMBULATORY_CARE_PROVIDER_SITE_OTHER): Payer: Self-pay | Admitting: Surgery

## 2011-04-26 MED ORDER — SODIUM CHLORIDE 0.9 % IJ SOLN: 3.0000 mL | INTRAMUSCULAR | Status: DC | PRN

## 2011-04-26 MED ORDER — SODIUM CHLORIDE 0.9 % IJ SOLN
3.0000 mL | Freq: Two times a day (BID) | INTRAMUSCULAR | Status: DC
  Administered 2011-04-26 – 2011-04-28 (×5): 3 mL via INTRAVENOUS

## 2011-04-26 MED ORDER — OXYCODONE HCL 5 MG PO TABS: 5.0000 mg | ORAL_TABLET | ORAL | Status: DC | PRN

## 2011-04-26 MED ORDER — CIPROFLOXACIN HCL 500 MG PO TABS
500.0000 mg | ORAL_TABLET | Freq: Two times a day (BID) | ORAL | Status: DC
  Administered 2011-04-26 – 2011-04-28 (×5): 500 mg via ORAL
  Filled 2011-04-26 (×7): qty 1

## 2011-04-26 MED ORDER — POTASSIUM CHLORIDE 10 MEQ/100ML IV SOLN
10.0000 meq | INTRAVENOUS | Status: AC
  Administered 2011-04-26 – 2011-04-27 (×4): 10 meq via INTRAVENOUS
  Filled 2011-04-26 (×4): qty 100

## 2011-04-26 MED ORDER — SODIUM CHLORIDE 0.9 % IV SOLN
1.0000 g | INTRAVENOUS | Status: DC
  Administered 2011-04-26 – 2011-04-27 (×2): 1 g via INTRAVENOUS
  Filled 2011-04-26 (×3): qty 1

## 2011-04-26 NOTE — Telephone Encounter (Signed)
I was paged by the patient's parents in [place], both self-professed doctors.  I called them back at [phone].  They had many questions about their daughter's emergent surgery, especially about the "mass" in the colon.  I discussed the patient's status to the family.   I noted that the pathology results noted this to be diverticulitis with no evidence of cancer.  They were relieved.  The anatomy & physiology of the digestive tract was discussed.  The pathophysiology of diverticulitis/osis was discussed.     Goals of post-operative recovery were discussed as well.  We are working to minimize complications.    She is making good progress & hopefully can be discharged home in 1-2 days with close follow-up by Dr. Phillips Grout. Questions were answered.  The parents expressed understanding & appreciation.  They want to talk to Dr. Derrell Lolling soon.  I will pass that on.

## 2011-04-26 NOTE — Progress Notes (Addendum)
PCP: Hollice Espy, MD  Outpatient Care Team: Patient Care Team: Hollice Espy as PCP - General (Family Medicine) Barrie Folk, MD as Consulting Physician (Gastroenterology) [name], MD as Consulting Physician (General Surgery)  Inpatient Treatment Team: Treatment Team: Attending Provider: Md Ccs; Registered Nurse: Gloriajean Dell, RN; Technician: Fabio Asa, NT; Technician: Lanna Poche, NT; Registered Nurse: Karma Greaser, RN; Registered Nurse: Liborio Nixon, RN; Technician: Augustina Mood, [place]; Registered Nurse: Nolon Nations, RN   LOS: 5 days   4 Days Post-Op  Procedure(s): Diverticulitis of colon with perforation s/p sigmoid colectomy & end colostomy / Annette Gentry  Subjective:  Feeling better Tol clears well Walking more  Objective:  Vital signs:  Temp:  [98.4 F (36.9 C)-98.7 F (37.1 C)] 98.6 F (37 C) (11/25 0522) Pulse Rate:  [61-93] 61  (11/25 0522) Resp:  [16-19] 16  (11/25 0522) BP: (114-117)/(70-76) 117/72 mmHg (11/25 0522) SpO2:  [95 %-98 %] 95 % (11/25 0522) Last BM Date: 04/21/11  Intake/Output    from previous day: 11/24 0701 - 11/25 0700 In: 1654 [P.O.:820; I.V.:684; IV Piggyback:150] Out: 1175 [Urine:1175]  this shift:    Flatus: Increased volume BM: no  Physical Exam:  General: Pt awake/alert/oriented x4 in no acute distress Eyes: PERRL, normal EOM.  Sclera clear.  No icterus Neuro: CN II-XII intact w/o focal sensory/motor deficits. Lymph: No head/neck/groin lymphadenopathy Psych:  No delerium/psychosis/paranoia HENT: Normocephalic, Mucus membranes moist.  No thrush Neck: Supple, No tracheal deviation Chest: Clear.  No chest wall pain w good excursion CV:  Pulses intact.  Regular rhythm Abdomen: Soft, Minimally tender/Nondistended.  No incarcerated hernias.  Wound clean.  Colostomy more pink w scant serosanguinous fluid and flatus in bag Ext:  SCDs BLE.  No mjr edema.  No  cyanosis Skin: No petechiae / purpurae  Results:   Labs: Results for orders placed during the hospital encounter of 04/21/11 (from the past 48 hour(s))  CBC     Status: Abnormal   Collection Time   04/25/11  3:38 AM      Component Value Range Comment   WBC 5.1  4.0 - 10.5 (K/uL)    RBC 3.77 (*) 3.87 - 5.11 (MIL/uL)    Hemoglobin 11.8 (*) 12.0 - 15.0 (g/dL)    HCT 04.5 (*) 40.9 - 46.0 (%)    MCV 91.2  78.0 - 100.0 (fL)    MCH 31.3  26.0 - 34.0 (pg)    MCHC 34.3  30.0 - 36.0 (g/dL)    RDW 81.1  91.4 - 78.2 (%)    Platelets 237  150 - 400 (K/uL)   POTASSIUM     Status: Abnormal   Collection Time   04/25/11  3:38 AM      Component Value Range Comment   Potassium 3.3 (*) 3.5 - 5.1 (mEq/L)     Imaging / Studies: @RISRSLT24 @  Antibiotics: Anti-infectives     Start     Dose/Rate Route Frequency Ordered Stop   04/23/11 1000   ertapenem (INVANZ) 1 g in sodium chloride 0.9 % 50 mL IVPB        1 g 100 mL/hr over 30 Minutes Intravenous Every 24 hours 04/22/11 1337     04/22/11 1100   piperacillin-tazobactam (ZOSYN) IVPB 3.375 g  Status:  Discontinued        3.375 g 12.5 mL/hr over 240 Minutes Intravenous 3 times per day 04/22/11 0417 04/22/11 1337   04/22/11 0845   ertapenem (  INVANZ) 1 g in sodium chloride 0.9 % 50 mL IVPB        1 g 100 mL/hr over 30 Minutes Intravenous Every 24 hours 04/22/11 0838 04/22/11 1007   04/22/11 0430  piperacillin-tazobactam (ZOSYN) 3.375 g in dextrose 5 % 50 mL IVPB       3.375 g 100 mL/hr over 30 Minutes Intravenous To Emergency Dept 04/22/11 0411 04/22/11 0502          Medications / Allergies: per chart  Assessment / Plan: Annette Gentry  55 y.o. female  Procedure(s): Diverticulitis of colon with perforation s/p sigmoid colectomy & end colostomy / Annette Gentry  Problem List:  Principal Problem:  *Diverticulitis of colon with perforation  -path c/w diverticulitis, no cancer - reexplained to pt  -IV ABx x 7 days  -Adv to bland diet as  tolerated w flatus  -bowel regimen Active Problems:  Personal history of colonic polyps -increase PO pain control -VTE prophylaxis- SCDs, etc -mobilize as tolerated to help recovery -D/C IVF  -Follow K level  I discussed the patient's status.  More questions were answered.  She expressed understanding & appreciation.    Lorenso Courier, M.D., F.A.C.S. Gastrointestinal and Minimally Invasive Surgery [facility] [address] [place], [place] [address] [phone] Main / Paging [phone] Voice Mail   04/26/2011

## 2011-04-27 ENCOUNTER — Encounter (HOSPITAL_COMMUNITY): Payer: Self-pay | Admitting: General Surgery

## 2011-04-27 NOTE — Progress Notes (Signed)
Patient ID: Annette Gentry, female   DOB: Mar 19, 1956, 55 y.o.   MRN: [id number] 5 Days Post-Op  Subjective: Pt feels well.  Tolerating soft diet, feels full quickly.  Objective: Vital signs in last 24 hours: Temp:  [97.8 F (36.6 C)-98.9 F (37.2 C)] 98.5 F (36.9 C) (11/26 0617) Pulse Rate:  [70-78] 70  (11/26 0617) Resp:  [16-19] 16  (11/26 0617) BP: (110-111)/(68-72) 110/72 mmHg (11/26 0617) SpO2:  [97 %-98 %] 98 % (11/26 0617) Last BM Date: 04/26/11  Intake/Output from previous day: 11/25 0701 - 11/26 0700 In: 540 [P.O.:340; IV Piggyback:200] Out: 2400 [Urine:2400] Intake/Output this shift:    PE: Abd: soft, minimally tender, +BS, incision c/d/i.  Ostomy with stool and air.  Stoma pink. Ht: regular   Lab Results:   Basename 04/25/11 0338  WBC 5.1  HGB 11.8*  HCT 34.4*  PLT 237   BMET  Basename 04/25/11 0338  NA --  K 3.3*  CL --  CO2 --  GLUCOSE --  BUN --  CREATININE --  CALCIUM --   PT/INR No results found for this basename: LABPROT:2,INR:2 in the last 72 hours   Studies/Results: No results found.  Anti-infectives: Anti-infectives     Start     Dose/Rate Route Frequency Ordered Stop   04/26/11 1000   ciprofloxacin (CIPRO) tablet 500 mg        500 mg Oral 2 times daily 04/26/11 0924     04/26/11 1000   ertapenem (INVANZ) 1 g in sodium chloride 0.9 % 50 mL IVPB        1 g 100 mL/hr over 30 Minutes Intravenous Every 24 hours 04/26/11 0931     04/23/11 1000   ertapenem (INVANZ) 1 g in sodium chloride 0.9 % 50 mL IVPB  Status:  Discontinued        1 g 100 mL/hr over 30 Minutes Intravenous Every 24 hours 04/22/11 1337 04/26/11 0931   04/22/11 1100   piperacillin-tazobactam (ZOSYN) IVPB 3.375 g  Status:  Discontinued        3.375 g 12.5 mL/hr over 240 Minutes Intravenous 3 times per day 04/22/11 0417 04/22/11 1337   04/22/11 0845   ertapenem (INVANZ) 1 g in sodium chloride 0.9 % 50 mL IVPB        1 g 100 mL/hr over 30 Minutes Intravenous  Every 24 hours 04/22/11 0838 04/22/11 1007   04/22/11 0430  piperacillin-tazobactam (ZOSYN) 3.375 g in dextrose 5 % 50 mL IVPB       3.375 g 100 mL/hr over 30 Minutes Intravenous To Emergency Dept 04/22/11 0411 04/22/11 0502           Assessment/Plan  1. S/p ex lap with Hartmann's for tics   Plan: 1. Will arrange for [facility] today for ostomy care. 2. Ostomy RN supposed to come visit pt today for further teaching 3. Pt anxious about going home today while trying to get [facility] and ostomy teaching done.  Will plan on d/c tomorrow AM. 4. Currently pt on Invanz, and po Cirpo (10days).  Unsure why pt getting both.  Suspect can d/c invanz but will d/w MD  LOS: 6 days    Annette Gentry E 04/27/2011

## 2011-04-27 NOTE — Consult Note (Signed)
WOC ostomy consult  Patient's pouch changed with her observing; asking appropriate questions.  Supplies provided.  HHRN ordered for post discharge support and reinforcement of education.  Ready for discharge when surgeon provides orders. Thank you for allowing me to assist with this patient's care. Ladona Mow, MSN, RN, GNP, CWOCN [phone]).

## 2011-04-27 NOTE — Progress Notes (Signed)
Ur review completed.  CM referral-see [facility] note in shadow chart.

## 2011-04-27 NOTE — Progress Notes (Signed)
Doing well as above.  Will D/C Tillman Sers MD, FACS  04/27/2011, 5:23 PM

## 2011-04-28 MED ORDER — CIPROFLOXACIN HCL 500 MG PO TABS: 500.0000 mg | ORAL_TABLET | Freq: Two times a day (BID) | ORAL | Status: AC

## 2011-04-28 MED ORDER — OXYCODONE HCL 5 MG PO TABS: 5.0000 mg | ORAL_TABLET | ORAL | Status: DC | PRN

## 2011-04-28 NOTE — Progress Notes (Signed)
Patient ID: Annette Gentry, female   DOB: [birth year], 55 y.o.   MRN: [id number] 6 Days Post-Op  Subjective: Pt feels well.  tolerating regular diet.  More comfortable with ostomy.  Objective: Vital signs in last 24 hours: Temp:  [97.8 F (36.6 C)-98.3 F (36.8 C)] 97.8 F (36.6 C) (11/27 0532) Pulse Rate:  [78-92] 78  (11/27 0532) Resp:  [16-18] 16  (11/27 0532) BP: (100-116)/(63-73) 113/65 mmHg (11/27 0532) SpO2:  [97 %-100 %] 97 % (11/27 0532) Last BM Date: 04/27/11  Intake/Output from previous day: 11/26 0701 - 11/27 0700 In: 460 [P.O.:360; IV Piggyback:100] Out: 3550 [Urine:2950; Stool:600] Intake/Output this shift:    PE: Abd: soft, +BS, ND, incision c/d/i with staples.  Ostomy with air and stool.  Stoma pink and healthy  Lab Results:  No results found for this basename: WBC:2,HGB:2,HCT:2,PLT:2 in the last 72 hours BMET No results found for this basename: NA:2,K:2,CL:2,CO2:2,GLUCOSE:2,BUN:2,CREATININE:2,CALCIUM:2 in the last 72 hours PT/INR No results found for this basename: LABPROT:2,INR:2 in the last 72 hours   Studies/Results: No results found.  Anti-infectives: Anti-infectives     Start     Dose/Rate Route Frequency Ordered Stop   04/26/11 1000   ciprofloxacin (CIPRO) tablet 500 mg        500 mg Oral 2 times daily 04/26/11 0924     04/26/11 1000   ertapenem (INVANZ) 1 g in sodium chloride 0.9 % 50 mL IVPB  Status:  Discontinued        1 g 100 mL/hr over 30 Minutes Intravenous Every 24 hours 04/26/11 0931 04/27/11 1723   04/23/11 1000   ertapenem (INVANZ) 1 g in sodium chloride 0.9 % 50 mL IVPB  Status:  Discontinued        1 g 100 mL/hr over 30 Minutes Intravenous Every 24 hours 04/22/11 1337 04/26/11 0931   04/22/11 1100   piperacillin-tazobactam (ZOSYN) IVPB 3.375 g  Status:  Discontinued        3.375 g 12.5 mL/hr over 240 Minutes Intravenous 3 times per day 04/22/11 0417 04/22/11 1337   04/22/11 0845   ertapenem (INVANZ) 1 g in sodium chloride 0.9  % 50 mL IVPB        1 g 100 mL/hr over 30 Minutes Intravenous Every 24 hours 04/22/11 0838 04/22/11 1007   04/22/11 0430  piperacillin-tazobactam (ZOSYN) 3.375 g in dextrose 5 % 50 mL IVPB       3.375 g 100 mL/hr over 30 Minutes Intravenous To Emergency Dept 04/22/11 0411 04/22/11 0502           Assessment/Plan  1. S/p Hartmann's for tics  Plan:  Ok to d/c home Has HH arranged for ostomy care F/u with RN next week for staples out and then with Dr. Derrell Lolling in 2 more weeks.  LOS: 7 days    Annette Gentry E 04/28/2011

## 2011-04-28 NOTE — Discharge Summary (Signed)
Patient ID: Annette Gentry MRN: [id number] DOB/AGE: 1955-10-18 55 y.o.  Admit date: 04/21/2011 Discharge date: 04/28/2011  Procedures: Exploratory Laparotomy with Hartmann's Procedure  Consults: none  Reason for Admission: Pt with probably perforated diverticulitis with free air who was admitted on Nov 15.  She was treated and was getting better.  She was d/c home 5 days later, but within hours of being home she developed persistent severe pain again.  She represented to the ED where she had a repeat CT scan with inflammatory changes of sigmoid colon and possible pericolonic abscess.  Admission Diagnoses: 1. Sigmoid diverticulitis with developing abscess and phlegmon 2. Sjogren's syndrome 3. Asthma  Hospital Course: The patient was admitted and placed on n.p.o. status. She was started on IV Invanz. An interventional radiology consultation was requested for possible percutaneous drainage. This abscess was unable to be drained. Because of this and her continued problem it was felt that she would need surgical intervention. Therefore the following day the patient was taken to the operating room where she underwent a sigmoid colectomy and colostomy and Hartmann's procedure. The patient tolerated the procedure well. The patient did not have an NG tube placed postoperatively. She was allowed to have sips of liquids. On postoperative day 2 the patient was started on clear liquid diet. She did pass some flatus on postoperative day 3 and this was advanced to full liquids. Over the next several days her diet was advanced to a bland soft diet. The patient did have a colostomy in the left lower cautery. A wound ostomy care nurse was consulted for assistance with her ostomy. Her wound remained stable postoperatively with staples. These were left in place at time of discharge. The patient was otherwise doing well and on postoperative day #6 the patient was felt stable for discharge home.  Discharge  Diagnoses:  Principal Problem:  *Diverticulitis of colon with perforation Active Problems:  Personal history of colonic polyps   Discharge Medications: Current Discharge Medication List    START taking these medications   Details  oxyCODONE (OXY IR/ROXICODONE) 5 MG immediate release tablet Take 1-2 tablets (5-10 mg total) by mouth every 4 (four) hours as needed. Qty: 30 tablet, Refills: 0      CONTINUE these medications which have CHANGED   Details  ciprofloxacin (CIPRO) 500 MG tablet Take 1 tablet (500 mg total) by mouth 2 (two) times daily. Qty: 16 tablet, Refills: 0      CONTINUE these medications which have NOT CHANGED   Details  Cholecalciferol (VITAMIN D) 2000 UNITS tablet Take 2,000 Units by mouth daily.      cycloSPORINE (RESTASIS) 0.05 % ophthalmic emulsion Place 1 drop into both eyes 2 (two) times daily.      fluticasone (FLONASE) 50 MCG/ACT nasal spray Place 1 spray into the nose daily as needed. ALLERGIES    Multiple Vitamins-Minerals (MULTIVITAMINS THER. W/MINERALS) TABS Take 1 tablet by mouth daily.      naproxen sodium (ANAPROX) 220 MG tablet Take 220-440 mg by mouth once as needed. For pain      STOP taking these medications     metroNIDAZOLE (FLAGYL) 500 MG tablet         Discharge Instructions: Follow-up Information    Follow up with [name], MD in 3 weeks.   Contact information:   [employer] [facility] [address] [place] [address] [phone]       Follow up on 05/07/2011. (Cindi, RN)    Solicitor information:  [phone], be there at 8:15am for staple removal         Signed: Cameran Ahmed E 04/28/2011, 11:07 AM

## 2011-04-28 NOTE — Discharge Summary (Signed)
Mariella Saa MD, FACS  04/28/2011, 6:46 PM

## 2011-04-28 NOTE — Consult Note (Signed)
Ostomy follow-up:  Pt has supplies at bedside, preparing to D/C home today.  Denies further questions regarding ostomy routines or supplies.  Practiced emptying without assistance. Plans to have home health ast after D/C. Will not plan to follow further unless re-consulted.  [address] RN, MSN, [employer]  [phone]

## 2011-04-28 NOTE — Progress Notes (Signed)
Agree with above  Annette Ciolek T Murl Golladay MD, FACS  04/28/2011, 6:41 PM  

## 2011-05-07 ENCOUNTER — Ambulatory Visit (INDEPENDENT_AMBULATORY_CARE_PROVIDER_SITE_OTHER): Payer: BC Managed Care – PPO | Admitting: General Surgery

## 2011-05-07 ENCOUNTER — Encounter (INDEPENDENT_AMBULATORY_CARE_PROVIDER_SITE_OTHER): Payer: Self-pay | Admitting: General Surgery

## 2011-05-07 VITALS — BP 98/62 | HR 60 | Temp 97.8°F | Resp 14 | Ht 67.0 in | Wt 142.4 lb

## 2011-05-07 DIAGNOSIS — Z9889 Other specified postprocedural states: Secondary | ICD-10-CM

## 2011-05-07 NOTE — Patient Instructions (Signed)
Drink lots of water. Adhere to a high fiber, low fat diet. Take some Metamucil as necessary to keep her stool soft. No sports or heavy lifting for one month. Return to see me in one month for a postop checkup.

## 2011-05-07 NOTE — Progress Notes (Signed)
Subjective:     Patient ID: Annette Gentry, female   DOB: 16-Dec-1955, 55 y.o.   MRN: [id number]  HPI This patient had a Hartmann resection for perforated diverticulitis with abscess. Surgery was April 21, 2011. We had a fairly long distal segment which I tacked up to the left pelvic sidewall.  She is doing fairly well. Appetite good. Stool is a little bit hard and sticky. She is learning how to change the colostomy bag. Visiting nurses are still coming to her house. She is not having much pain. Review of Systems     Objective:   Physical Exam The patient looks well. Her husband is with her.  Abdomen: Soft. Nontender. Nondistended. Midline wound clean. Remaining staples removed. Colostomy pink and healthy. Stool in the bag.    Assessment:     Perforated diverticulitis with abscess, status post sigmoid colectomy with colostomy.  Final pathology report confirms acute diverticulitis with abscess. No malignancy seen.    Plan:     Diet and activities discussed. No sports or heavy lifting for one month. High-fiber low-fat diet discussed. Emphasis on hydration.  Return to see me in one month.  We will get her colonoscopy report from [facility] which was done 3 or 4 years ago.  She will probably need a barium enema preop colostomy closure.   We'll plan to reverse the colostomy at about 3 months.

## 2011-06-11 ENCOUNTER — Encounter (INDEPENDENT_AMBULATORY_CARE_PROVIDER_SITE_OTHER): Payer: Self-pay | Admitting: General Surgery

## 2011-06-11 ENCOUNTER — Ambulatory Visit (INDEPENDENT_AMBULATORY_CARE_PROVIDER_SITE_OTHER): Payer: BC Managed Care – PPO | Admitting: General Surgery

## 2011-06-11 VITALS — BP 102/64 | HR 72 | Temp 97.2°F | Resp 12 | Ht 67.0 in | Wt 145.6 lb

## 2011-06-11 DIAGNOSIS — R1011 Right upper quadrant pain: Secondary | ICD-10-CM

## 2011-06-11 DIAGNOSIS — K5732 Diverticulitis of large intestine without perforation or abscess without bleeding: Secondary | ICD-10-CM

## 2011-06-11 DIAGNOSIS — K5792 Diverticulitis of intestine, part unspecified, without perforation or abscess without bleeding: Secondary | ICD-10-CM

## 2011-06-11 NOTE — Progress Notes (Signed)
Subjective:     Patient ID: Annette Gentry, female   DOB: 1955/08/30, 56 y.o.   MRN: [id number]  HPI  This is a 56 year old Caucasian female underwent a Hartmann resection for diverticulitis with abscess on April 22, 2011. She is recovered uneventfully. She has mild asthma, Sjgren's syndrome, and uterine fibroids.  Her appetite is normal. Her bowel function is fairly good. The colostomy is working well. No wound or colostomy problems. No pain around the colostomy.  She does report intermittent episodes of right upper quadrant pain usually after meals. She says this is infrequent and has been going on for a few years. She has never had a gallbladder ultrasound. Her CT scan does not show gallstones. She wonders about this.  Her last colonoscopy was 4 years ago with Dorena Cookey. She was told she had diverticulosis but no nothing abnormal. We are going to ask for that reportReview of Systems     Objective:   Physical Exam Constitutional: Patient alert. In no distress. Pleasant.  Abdomen: Soft. Flat. Nontender. Midline scar well healed. No hernia. Colostomy healthy. No hernia. Liver and spleen not enlarged. No right upper quadrant tenderness or mass.    Assessment:    Diverticulitis with abscess, doing well 7 weeks postop Hartmann resection. No apparent surgical complications.  Intermittent episodes of right upper quadrant pain, postprandial, etiology unclear  Sjgren syndrome  Asthma  Uterine fibroids    Plan:     The patient desires colostomy reversal, and so we will proceed with planning for that. I told her that we would probably do that at about 3 months which would be February 21.  Obtain colonoscopy report from Dr. Dorena Cookey.  Will get a barium enema per rectum and per colostomy  Obtain ultrasound of the gallbladder.  She'll return to see me in 2 weeks after these tests are done for final discussion and planning.      Angelia Mould. Derrell Lolling, M.D., [facility] General and Minimally invasive Surgery Breast and Colorectal Surgery Office:   [phone] Pager:   [phone]

## 2011-06-11 NOTE — Patient Instructions (Signed)
You will be scheduled for a barium enema per rectum and per  colostomy to study the colon prior to planning an operation to reverse your colostomy. Because of your right upper quadrant pain we will also get a gallbladder ultrasound. She will return to see me in 2-3 weeks to discuss colostomy reversal surgery.

## 2011-06-22 ENCOUNTER — Ambulatory Visit
Admission: RE | Admit: 2011-06-22 | Discharge: 2011-06-22 | Disposition: A | Discharge status: Home / Self Care | Payer: BC Managed Care – PPO | Source: Ambulatory Visit | Attending: General Surgery | Admitting: General Surgery

## 2011-06-22 DIAGNOSIS — K5792 Diverticulitis of intestine, part unspecified, without perforation or abscess without bleeding: Secondary | ICD-10-CM

## 2011-06-22 DIAGNOSIS — R1011 Right upper quadrant pain: Secondary | ICD-10-CM

## 2011-07-07 ENCOUNTER — Encounter (INDEPENDENT_AMBULATORY_CARE_PROVIDER_SITE_OTHER): Payer: BC Managed Care – PPO | Admitting: General Surgery

## 2011-07-16 ENCOUNTER — Encounter (INDEPENDENT_AMBULATORY_CARE_PROVIDER_SITE_OTHER): Payer: Self-pay | Admitting: General Surgery

## 2011-07-16 ENCOUNTER — Ambulatory Visit (INDEPENDENT_AMBULATORY_CARE_PROVIDER_SITE_OTHER): Payer: BC Managed Care – PPO | Admitting: General Surgery

## 2011-07-16 VITALS — BP 110/74 | HR 72 | Temp 98.0°F | Resp 18 | Ht 67.0 in | Wt 144.0 lb

## 2011-07-16 DIAGNOSIS — K5732 Diverticulitis of large intestine without perforation or abscess without bleeding: Secondary | ICD-10-CM

## 2011-07-16 DIAGNOSIS — K5720 Diverticulitis of large intestine with perforation and abscess without bleeding: Secondary | ICD-10-CM

## 2011-07-16 NOTE — Progress Notes (Signed)
Patient ID: Annette Gentry, female   DOB: 03-08-1956, 56 y.o.   MRN: [id number]  Chief Complaint  Patient presents with  . Follow-up    discuss colon surgery    HPI Annette Gentry is a 56 y.o. female.  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 She had a Hartman resection and has recovered and is return to full activities.  I reviewed her colonoscopy from [facility] and 2008 and it showed only a few sigmoid diverticula. Ultrasound of her gallbladder is normal. A barium enema shows the proximal colon looks normal. The distal rectosigmoid stump shows a little bit of irregularity at the stapled end so we probably will need to resect a little bit of the rectal stump but the distal end is quite long. This might even be a handsewn anastomosis rather than stapled depending on technical factors.  She would like to go ahead with colostomy closure and is looking at March which is reasonable. Her medical conditions have been stable. HPI  Past Medical History  Diagnosis Date  . Heart murmur   . Asthma   . Diverticulitis of colon with perforation 04/20/2011  . Personal history of colonic polyps 04/23/2011    Past Surgical History  Procedure Date  . Hernia repair   . Colon resection 04/22/2011    Procedure: COLON RESECTION;  Surgeon: [name], MD;  Location: [facility];  Service: General;  Laterality: N/A;  colon resection, drainage of abscess  . Colostomy 04/22/2011    Procedure: COLOSTOMY;  Surgeon: [name], MD;  Location: [facility];  Service: General;  Laterality: N/A;  . Myoma on right thigh 2002 (summer)    Family History  Problem Relation Age of Onset  . Hyperlipidemia Mother   . Hyperlipidemia Father   . Hyperlipidemia Sister   . Hyperlipidemia Brother     Social History History  Substance Use Topics  . Smoking status: Never Smoker   . Smokeless tobacco: Never Used  . Alcohol Use: 4.2 oz/week    7  Glasses of wine per week     moderate, less than one per day    Allergies  Allergen Reactions  . Codeine Nausea Only    Current Outpatient Prescriptions  Medication Sig Dispense Refill  . Cholecalciferol (VITAMIN D) 2000 UNITS tablet Take 2,000 Units by mouth daily.        . cycloSPORINE (RESTASIS) 0.05 % ophthalmic emulsion Place 1 drop into both eyes 2 (two) times daily.        . Multiple Vitamins-Minerals (MULTIVITAMINS THER. W/MINERALS) TABS Take 1 tablet by mouth daily.        . naproxen sodium (ANAPROX) 220 MG tablet Take 220-440 mg by mouth once as needed. For pain      . norethindrone-ethinyl estradiol (MICROGESTIN,JUNEL,LOESTRIN) 1-20 MG-MCG tablet Take 1 tablet by mouth daily.        Review of Systems Review of Systems  Constitutional: Negative for fever, chills and unexpected weight change.  HENT: Negative for hearing loss, congestion, sore throat, trouble swallowing and voice change.   Eyes: Negative for visual disturbance.  Respiratory: Negative for cough and wheezing.   Cardiovascular: Negative for chest pain, palpitations and leg swelling.  Gastrointestinal: Negative for nausea, vomiting, abdominal pain, diarrhea, constipation, blood in stool, abdominal distention and anal bleeding.  Genitourinary: Negative for hematuria, vaginal bleeding and difficulty urinating.  Musculoskeletal: Negative for arthralgias.  Skin: Negative for rash and wound.  Neurological: Negative  for seizures, syncope and headaches.  Hematological: Negative for adenopathy. Does not bruise/bleed easily.  Psychiatric/Behavioral: Negative for confusion.    Blood pressure 110/74, pulse 72, temperature 98 F (36.7 C), resp. rate 18, height 5\' 7"  (1.702 m), weight 144 lb (65.318 kg).  Physical Exam Physical Exam  Constitutional: She is oriented to person, place, and time. She appears well-developed and well-nourished. No distress.  HENT:  Head: Normocephalic and atraumatic.  Nose: Nose normal.    Mouth/Throat: No oropharyngeal exudate.  Eyes: Conjunctivae and EOM are normal. Pupils are equal, round, and reactive to light. Left eye exhibits no discharge. No scleral icterus.  Neck: Neck supple. No JVD present. No tracheal deviation present. No thyromegaly present.  Cardiovascular: Normal rate, regular rhythm, normal heart sounds and intact distal pulses.   No murmur heard. Pulmonary/Chest: Effort normal and breath sounds normal. No respiratory distress. She has no wheezes. She has no rales. She exhibits no tenderness.  Abdominal: Soft. Bowel sounds are normal. She exhibits no distension and no mass. There is no tenderness. There is no rebound and no guarding.       Colostomy left lower quadrant healthy. No hernia. Lower midline scar well healed. No hernia. Right inguinal scar from hernia repair and well healed. No tenderness. No mass.  Musculoskeletal: She exhibits no edema and no tenderness.  Lymphadenopathy:    She has no cervical adenopathy.  Neurological: She is alert and oriented to person, place, and time. She exhibits normal muscle tone. Coordination normal.  Skin: Skin is warm. No rash noted. She is not diaphoretic. No erythema. No pallor.  Psychiatric: She has a normal mood and affect. Her behavior is normal. Judgment and thought content normal.    Data Reviewed I reviewed her old colonoscopy report, my records, the recent barium enema and the recent ultrasound.  Assessment    Perforated diverticulitis, status post sigmoid colectomy with colostomy. Desires colostomy reversal.  Mild asthma  Sjgren syndrome  Uterine fibroids.    Plan    Will proceed with scheduling her for laparotomy, resection and closure of her colostomy. Due to the long distal segment this may require further resection. We will decide whether to do a stapled anastomosis or a handsewn anastomosis at the time of surgery.  She will undergo bowel prep 24 hours preop.  I have discussed the  indications and details of surgery with her. Risks and complications have been outlined, including but not limited to bleeding, infection, injury to adjacent organs such as the bladder or ureter was major reconstructive surgery, anastomotic leak with reoperation. I have told her that if there is any technical problem with the anastomosis I might do a temporary loop ileostomy. We discussed cardiac pulmonary and thromboembolic problems. She is aware that this is an incomplete list of complications. She understands these issues. Her questions were answered. She agrees with the plan.       Angelia Mould. Derrell Lolling, M.D., [facility] General and Minimally invasive Surgery Breast and Colorectal Surgery Office:   [phone] Pager:   [phone]  07/16/2011, 8:58 AM

## 2011-07-16 NOTE — Patient Instructions (Signed)
You will be scheduled for surgery to take down your colostomy and connect the colon together again. We have discussed the details of the surgery and its risks. You will undergo a bowel prep for 24 hours prior to the surgery. I also want you to take some tapwater enemas per rectum to clean out the lower end.

## 2011-08-03 ENCOUNTER — Encounter (HOSPITAL_COMMUNITY): Payer: Self-pay | Admitting: Pharmacy Technician

## 2011-08-04 ENCOUNTER — Encounter (HOSPITAL_COMMUNITY)
Admission: RE | Admit: 2011-08-04 | Discharge: 2011-08-04 | Disposition: A | Discharge status: Home / Self Care | Payer: BC Managed Care – PPO | Source: Ambulatory Visit | Attending: General Surgery | Admitting: General Surgery

## 2011-08-04 ENCOUNTER — Encounter (HOSPITAL_COMMUNITY): Payer: Self-pay

## 2011-08-04 HISTORY — DX: Rash and other nonspecific skin eruption: R21

## 2011-08-04 HISTORY — DX: Other specified disorders of bone density and structure, unspecified site: M85.80

## 2011-08-04 HISTORY — DX: Reserved for inherently not codable concepts without codable children: IMO0001

## 2011-08-04 HISTORY — DX: Unspecified osteoarthritis, unspecified site: M19.90

## 2011-08-04 HISTORY — DX: Colostomy status: Z93.3

## 2011-08-04 HISTORY — DX: Palpitations: R00.2

## 2011-08-04 HISTORY — DX: Encounter for other specified aftercare: Z51.89

## 2011-08-04 HISTORY — DX: Sjogren syndrome, unspecified: M35.00

## 2011-08-04 LAB — SURGICAL PCR SCREEN
MRSA, PCR: NEGATIVE
Staphylococcus aureus: NEGATIVE

## 2011-08-04 LAB — CBC
Hemoglobin: 13.1 g/dL (ref 12.0–15.0)
MCH: 30.5 pg (ref 26.0–34.0)
MCHC: 33.5 g/dL (ref 30.0–36.0)
MCV: 90.9 fL (ref 78.0–100.0)
RBC: 4.3 MIL/uL (ref 3.87–5.11)

## 2011-08-04 LAB — COMPREHENSIVE METABOLIC PANEL
BUN: 10 mg/dL (ref 6–23)
CO2: 27 mEq/L (ref 19–32)
Calcium: 10 mg/dL (ref 8.4–10.5)
Creatinine, Ser: 0.73 mg/dL (ref 0.50–1.10)
GFR calc Af Amer: >90 mL/min (ref >90)
GFR calc non Af Amer: >90 mL/min (ref >90)
Glucose, Bld: 89 mg/dL (ref 70–99)

## 2011-08-04 LAB — URINALYSIS, ROUTINE W REFLEX MICROSCOPIC
Ketones, ur: NEGATIVE mg/dL
Leukocytes, UA: NEGATIVE
Nitrite: NEGATIVE
Specific Gravity, Urine: 1.006 (ref 1.005–1.030)
pH: 7 (ref 5.0–8.0)

## 2011-08-04 LAB — DIFFERENTIAL
Basophils Absolute: 0 10*3/uL (ref 0.0–0.1)
Eosinophils Relative: 2 % (ref 0–5)
Lymphocytes Relative: 27 % (ref 12–46)
Neutro Abs: 4.4 10*3/uL (ref 1.7–7.7)
Neutrophils Relative %: 66 % (ref 43–77)

## 2011-08-04 MED ORDER — CHLORHEXIDINE GLUCONATE 4 % EX LIQD
1.0000 "application " | Freq: Once | CUTANEOUS | Status: DC
  Filled 2011-08-04: qty 15

## 2011-08-04 NOTE — Patient Instructions (Addendum)
20 Nakayla Rorabaugh  08/04/2011   Your procedure is scheduled on:  08/14/11  Report to SHORT STAY DEPT  at 9:00 AM.  Call this number if you have problems the morning of surgery: [phone]   Remember:   Do not eat food or drink liquids AFTER MIDNIGHT  May have clear liquids UNTIL 6 HOURS BEFORE SURGERY  Clear liquids include soda, tea, black coffee, apple or grape juice, broth.  Take these medicines the morning of surgery with A SIP OF WATER: NONE (MAY USE EYE DROPS IF NEEDED)   Do not wear jewelry, make-up or nail polish.  Do not wear lotions, powders, or perfumes.   Do not shave legs or underarms 48 hrs. before surgery (men may shave face)  Do not bring valuables to the hospital.  Contacts, dentures or bridgework may not be worn into surgery.  Leave suitcase in the car. After surgery it may be brought to your room.  For patients admitted to the hospital, checkout time is 11:00 AM the day of discharge.   Patients discharged the day of surgery will not be allowed to drive home.  Name and phone number of your driver:   Special Instructions:   Please read over the following fact sheets that you were given: MRSA  Information               SHOWER WITH BETASEPT THE NIGHT BEFORE SURGERY AND THE MORNING OF SURGERY             FOLLOW BOWEL PREP FROM OFFICE            STOP ALL ASPIRIN AND HERBAL PRODUCTS 5-7 DAYS PRE OP

## 2011-08-05 ENCOUNTER — Telehealth (INDEPENDENT_AMBULATORY_CARE_PROVIDER_SITE_OTHER): Payer: Self-pay

## 2011-08-05 NOTE — Telephone Encounter (Signed)
Pt called stating she is having a small amount of irritation around stoma site. She has had this before and usually gets resolution of symptoms. Since pt is scheduled for surgery next week she wants to be sure this irritation does not progress. Pt  referred pt to [facility]. Olegario Messier is a ostomy specialist and can evaluated the irritation and make recommendations. Pt will call Olegario Messier today and call to let us know if this does not resolve.

## 2011-08-10 ENCOUNTER — Telehealth (INDEPENDENT_AMBULATORY_CARE_PROVIDER_SITE_OTHER): Payer: Self-pay

## 2011-08-10 NOTE — Telephone Encounter (Signed)
Pt calling with update on skin irritation around ostomy under flange of ostomy bag. Pt states she is seeing slow improvement of skin. I reviewed this with Dr Derrell Lolling. Per his recommendation I advised pt to continue treatment of area and unless rash spreads we will continue with surgery as planned. Pt to call if skin does not continue to improve.

## 2011-08-12 NOTE — Pre-Procedure Instructions (Signed)
PT NOTIFIED OF TIME CHANGE TO 12:00 - INSTRUCTED TO ARRIVE AT 9:30 AM

## 2011-08-13 NOTE — H&P (Signed)
Annette Gentry    MRN: [id number]   Description: 56 year old female  Provider: [name], MD  Department: [facility]        Diagnoses     Diverticulitis of large intestine with perforation   - Primary    562.11      Reason for Visit     Follow-up    discuss colon surgery        Vitals      BP Pulse Temp Resp Ht Wt    110/74  72  98 F (36.7 C)  18  5\' 7"  (1.702 m)  144 lb (65.318 kg)          BMI - 22.55 kg/m2                 History and Physical   [name], MD  Patient ID: Annette Gentry, female   DOB: 12-17-1955, 56 y.o.   MRN: [id number]    Chief Complaint   Patient presents with   .  Follow-up       discuss colon surgery      HPI Annette Gentry is a 56 y.o. female.  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 She had a Hartman resection and has recovered and is return to full activities.  I reviewed her colonoscopy from [facility] Cookey and 2008 and it showed only a few sigmoid diverticula. Ultrasound of her gallbladder is normal. A barium enema shows the proximal colon looks normal. The distal rectosigmoid stump shows a little bit of irregularity at the stapled end so we probably will need to resect a little bit of the rectal stump but the distal end is quite long. This might even be a handsewn anastomosis rather than stapled depending on technical factors.  She would like to go ahead with colostomy closure and is looking at March which is reasonable. Her medical conditions have been stable.     Past Medical History   Diagnosis  Date   .  Heart murmur     .  Asthma     .  Diverticulitis of colon with perforation  04/20/2011   .  Personal history of colonic polyps  04/23/2011       Past Surgical History   Procedure  Date   .  Hernia repair     .  Colon resection  04/22/2011       Procedure: COLON RESECTION;  Surgeon: [name], MD;  Location: [facility];   Service: General;  Laterality: N/A;  colon resection, drainage of abscess   .  Colostomy  04/22/2011       Procedure: COLOSTOMY;  Surgeon: [name], MD;  Location: [facility];  Service: General;  Laterality: N/A;   .  Myoma on right thigh  2002 (summer)       Family History   Problem  Relation  Age of Onset   .  Hyperlipidemia  Mother     .  Hyperlipidemia  Father     .  Hyperlipidemia  Sister     .  Hyperlipidemia  Brother        Social History History   Substance Use Topics   .  Smoking status:  Never Smoker    .  Smokeless tobacco:  Never Used   .  Alcohol Use:  4.2 oz/week       7 Glasses of wine per week  moderate, less than one per day       Allergies   Allergen  Reactions   .  Codeine  Nausea Only       Current Outpatient Prescriptions   Medication  Sig  Dispense  Refill   .  Cholecalciferol (VITAMIN D) 2000 UNITS tablet  Take 2,000 Units by mouth daily.           .  cycloSPORINE (RESTASIS) 0.05 % ophthalmic emulsion  Place 1 drop into both eyes 2 (two) times daily.           .  Multiple Vitamins-Minerals (MULTIVITAMINS THER. W/MINERALS) TABS  Take 1 tablet by mouth daily.           .  naproxen sodium (ANAPROX) 220 MG tablet  Take 220-440 mg by mouth once as needed. For pain         .  norethindrone-ethinyl estradiol (MICROGESTIN,JUNEL,LOESTRIN) 1-20 MG-MCG tablet  Take 1 tablet by mouth daily.            Review of Systems   Constitutional: Negative for fever, chills and unexpected weight change.  HENT: Negative for hearing loss, congestion, sore throat, trouble swallowing and voice change.   Eyes: Negative for visual disturbance.  Respiratory: Negative for cough and wheezing.   Cardiovascular: Negative for chest pain, palpitations and leg swelling.  Gastrointestinal: Negative for nausea, vomiting, abdominal pain, diarrhea, constipation, blood in stool, abdominal distention and anal bleeding.  Genitourinary: Negative for hematuria, vaginal bleeding  and difficulty urinating.  Musculoskeletal: Negative for arthralgias.  Skin: Negative for rash and wound.  Neurological: Negative for seizures, syncope and headaches.  Hematological: Negative for adenopathy. Does not bruise/bleed easily.  Psychiatric/Behavioral: Negative for confusion.    Blood pressure 110/74, pulse 72, temperature 98 F (36.7 C), resp. rate 18, height 5\' 7"  (1.702 m), weight 144 lb (65.318 kg).   Physical Exam   Constitutional: She is oriented to person, place, and time. She appears well-developed and well-nourished. No distress.  HENT:   Head: Normocephalic and atraumatic.   Nose: Nose normal.   Mouth/Throat: No oropharyngeal exudate.  Eyes: Conjunctivae and EOM are normal. Pupils are equal, round, and reactive to light. Left eye exhibits no discharge. No scleral icterus.  Neck: Neck supple. No JVD present. No tracheal deviation present. No thyromegaly present.  Cardiovascular: Normal rate, regular rhythm, normal heart sounds and intact distal pulses.    No murmur heard. Pulmonary/Chest: Effort normal and breath sounds normal. No respiratory distress. She has no wheezes. She has no rales. She exhibits no tenderness.  Abdominal: Soft. Bowel sounds are normal. She exhibits no distension and no mass. There is no tenderness. There is no rebound and no guarding.       Colostomy left lower quadrant healthy. No hernia. Lower midline scar well healed. No hernia. Right inguinal scar from hernia repair and well healed. No tenderness. No mass.  Musculoskeletal: She exhibits no edema and no tenderness.  Lymphadenopathy:    She has no cervical adenopathy.  Neurological: She is alert and oriented to person, place, and time. She exhibits normal muscle tone. Coordination normal.  Skin: Skin is warm. No rash noted. She is not diaphoretic. No erythema. No pallor.  Psychiatric: She has a normal mood and affect. Her behavior is normal. Judgment and thought content normal.    Data  Reviewed I reviewed her old colonoscopy report, my records, the recent barium enema and the recent ultrasound.   Assessment   Perforated diverticulitis, status post  sigmoid colectomy with colostomy. Desires colostomy reversal.  Mild asthma  Sjgren syndrome  Uterine fibroids.   Plan Will proceed with scheduling her for laparotomy, resection and closure of her colostomy. Due to the long distal segment this may require further resection. We will decide whether to do a stapled anastomosis or a handsewn anastomosis at the time of surgery.  She will undergo bowel prep 24 hours preop.  I have discussed the indications and details of surgery with her. Risks and complications have been outlined, including but not limited to bleeding, infection, injury to adjacent organs such as the bladder or ureter was major reconstructive surgery, anastomotic leak with reoperation. I have told her that if there is any technical problem with the anastomosis I might do a temporary loop ileostomy. We discussed cardiac pulmonary and thromboembolic problems. She is aware that this is an incomplete list of complications. She understands these issues. Her questions were answered. She agrees with the plan.       Angelia Mould. Derrell Lolling, M.D., [facility] General and Minimally invasive Surgery Breast and Colorectal Surgery Office:   [phone] Pager:   [phone]

## 2011-08-14 ENCOUNTER — Encounter (HOSPITAL_COMMUNITY)
Admission: RE | Disposition: A | Discharge status: Home / Self Care | Payer: Self-pay | Source: Ambulatory Visit | Attending: General Surgery

## 2011-08-14 ENCOUNTER — Encounter (HOSPITAL_COMMUNITY): Payer: Self-pay | Admitting: Anesthesiology

## 2011-08-14 ENCOUNTER — Inpatient Hospital Stay (HOSPITAL_COMMUNITY)
Admission: RE | Admit: 2011-08-14 | Discharge: 2011-08-19 | DRG: 152 | Disposition: A | Discharge status: Home / Self Care | Payer: BC Managed Care – PPO | Source: Ambulatory Visit | Attending: General Surgery | Admitting: General Surgery

## 2011-08-14 ENCOUNTER — Encounter (HOSPITAL_COMMUNITY): Payer: Self-pay | Admitting: *Deleted

## 2011-08-14 ENCOUNTER — Ambulatory Visit (HOSPITAL_COMMUNITY): Payer: BC Managed Care – PPO | Admitting: Anesthesiology

## 2011-08-14 DIAGNOSIS — Z433 Encounter for attention to colostomy: Secondary | ICD-10-CM

## 2011-08-14 DIAGNOSIS — M3500 Sicca syndrome, unspecified: Secondary | ICD-10-CM | POA: Diagnosis present

## 2011-08-14 DIAGNOSIS — Y838 Other surgical procedures as the cause of abnormal reaction of the patient, or of later complication, without mention of misadventure at the time of the procedure: Secondary | ICD-10-CM | POA: Diagnosis not present

## 2011-08-14 DIAGNOSIS — K560 Paralytic ileus: Secondary | ICD-10-CM | POA: Diagnosis not present

## 2011-08-14 DIAGNOSIS — IMO0002 Reserved for concepts with insufficient information to code with codable children: Secondary | ICD-10-CM | POA: Diagnosis not present

## 2011-08-14 DIAGNOSIS — J45909 Unspecified asthma, uncomplicated: Secondary | ICD-10-CM | POA: Diagnosis present

## 2011-08-14 DIAGNOSIS — K5720 Diverticulitis of large intestine with perforation and abscess without bleeding: Secondary | ICD-10-CM

## 2011-08-14 DIAGNOSIS — Z8719 Personal history of other diseases of the digestive system: Secondary | ICD-10-CM

## 2011-08-14 HISTORY — PX: PROCTOSCOPY: SHX2266

## 2011-08-14 LAB — CBC
MCV: 91.8 fL (ref 78.0–100.0)
Platelets: 185 10*3/uL (ref 150–400)
RBC: 4.01 MIL/uL (ref 3.87–5.11)
RDW: 12.6 % (ref 11.5–15.5)
WBC: 13.5 10*3/uL — ABNORMAL HIGH (ref 4.0–10.5)

## 2011-08-14 LAB — CREATININE, SERUM
GFR calc Af Amer: >90 mL/min (ref >90)
GFR calc non Af Amer: >90 mL/min (ref >90)

## 2011-08-14 LAB — TYPE AND SCREEN: Antibody Screen: NEGATIVE

## 2011-08-14 SURGERY — CLOSURE, COLOSTOMY
Anesthesia: General | Site: Rectum | Wound class: Clean Contaminated

## 2011-08-14 MED ORDER — HYDROCODONE-ACETAMINOPHEN 5-325 MG PO TABS
1.0000 | ORAL_TABLET | ORAL | Status: DC | PRN
  Administered 2011-08-17 – 2011-08-19 (×6): 1 via ORAL
  Filled 2011-08-14: qty 2
  Filled 2011-08-14 (×5): qty 1

## 2011-08-14 MED ORDER — ONDANSETRON HCL 4 MG/2ML IJ SOLN
INTRAMUSCULAR | Status: DC | PRN
  Administered 2011-08-14: 4 mg via INTRAVENOUS

## 2011-08-14 MED ORDER — SUCCINYLCHOLINE CHLORIDE 20 MG/ML IJ SOLN
INTRAMUSCULAR | Status: DC | PRN
  Administered 2011-08-14: 100 mg via INTRAVENOUS

## 2011-08-14 MED ORDER — ACETAMINOPHEN 10 MG/ML IV SOLN
INTRAVENOUS | Status: AC
  Filled 2011-08-14: qty 100

## 2011-08-14 MED ORDER — HEPARIN SODIUM (PORCINE) 5000 UNIT/ML IJ SOLN
INTRAMUSCULAR | Status: AC
  Filled 2011-08-14: qty 1

## 2011-08-14 MED ORDER — CYCLOSPORINE 0.05 % OP EMUL
1.0000 [drp] | Freq: Two times a day (BID) | OPHTHALMIC | Status: DC
  Administered 2011-08-14 – 2011-08-15 (×2): 1 [drp] via OPHTHALMIC
  Administered 2011-08-15: 22:00:00 via OPHTHALMIC
  Administered 2011-08-16 – 2011-08-19 (×7): 1 [drp] via OPHTHALMIC
  Filled 2011-08-14 (×11): qty 1

## 2011-08-14 MED ORDER — LIDOCAINE HCL (CARDIAC) 20 MG/ML IV SOLN
INTRAVENOUS | Status: DC | PRN
  Administered 2011-08-14: 100 mg via INTRAVENOUS

## 2011-08-14 MED ORDER — NEOSTIGMINE METHYLSULFATE 1 MG/ML IJ SOLN
INTRAMUSCULAR | Status: DC | PRN
  Administered 2011-08-14: 5 mg via INTRAVENOUS

## 2011-08-14 MED ORDER — HEPARIN SODIUM (PORCINE) 5000 UNIT/ML IJ SOLN
5000.0000 [IU] | Freq: Once | INTRAMUSCULAR | Status: AC
  Administered 2011-08-14: 5000 [IU] via SUBCUTANEOUS

## 2011-08-14 MED ORDER — ONDANSETRON HCL 4 MG/2ML IJ SOLN: 4.0000 mg | Freq: Four times a day (QID) | INTRAMUSCULAR | Status: DC | PRN

## 2011-08-14 MED ORDER — DROPERIDOL 2.5 MG/ML IJ SOLN
INTRAMUSCULAR | Status: DC | PRN
  Administered 2011-08-14: 0.625 mg via INTRAVENOUS

## 2011-08-14 MED ORDER — POLYVINYL ALCOHOL 1.4 % OP SOLN
1.0000 [drp] | Freq: Every day | OPHTHALMIC | Status: DC
  Administered 2011-08-15 – 2011-08-16 (×2): 1 [drp] via OPHTHALMIC
  Filled 2011-08-14 (×2): qty 15

## 2011-08-14 MED ORDER — PROPOFOL 10 MG/ML IV EMUL
INTRAVENOUS | Status: DC | PRN
  Administered 2011-08-14: 150 mg via INTRAVENOUS

## 2011-08-14 MED ORDER — HYDROMORPHONE HCL PF 2 MG/ML IJ SOLN
2.0000 mg | INTRAMUSCULAR | Status: DC | PRN
  Administered 2011-08-14 – 2011-08-17 (×16): 2 mg via INTRAVENOUS
  Filled 2011-08-14 (×16): qty 1

## 2011-08-14 MED ORDER — DEXAMETHASONE SODIUM PHOSPHATE 10 MG/ML IJ SOLN
INTRAMUSCULAR | Status: DC | PRN
  Administered 2011-08-14: 10 mg via INTRAVENOUS

## 2011-08-14 MED ORDER — NORETHINDRONE ACET-ETHINYL EST 1-20 MG-MCG PO TABS
1.0000 | ORAL_TABLET | Freq: Every day | ORAL | Status: DC
  Administered 2011-08-16 – 2011-08-19 (×4): 1 via ORAL

## 2011-08-14 MED ORDER — 0.9 % SODIUM CHLORIDE (POUR BTL) OPTIME
TOPICAL | Status: DC | PRN
  Administered 2011-08-14: 1000 mL

## 2011-08-14 MED ORDER — GLYCOPYRROLATE 0.2 MG/ML IJ SOLN
INTRAMUSCULAR | Status: DC | PRN
  Administered 2011-08-14: .8 mg via INTRAVENOUS

## 2011-08-14 MED ORDER — ACETAMINOPHEN 10 MG/ML IV SOLN
INTRAVENOUS | Status: DC | PRN
  Administered 2011-08-14: 1000 mg via INTRAVENOUS

## 2011-08-14 MED ORDER — HYDROMORPHONE HCL PF 1 MG/ML IJ SOLN: 0.2500 mg | INTRAMUSCULAR | Status: DC | PRN

## 2011-08-14 MED ORDER — NORETHINDRONE ACET-ETHINYL EST 1-20 MG-MCG PO TABS: 1.0000 | ORAL_TABLET | Freq: Every day | ORAL | Status: DC

## 2011-08-14 MED ORDER — ALVIMOPAN 12 MG PO CAPS
12.0000 mg | ORAL_CAPSULE | Freq: Once | ORAL | Status: AC
  Administered 2011-08-14: 12 mg via ORAL
  Filled 2011-08-14: qty 1

## 2011-08-14 MED ORDER — MIDAZOLAM HCL 5 MG/5ML IJ SOLN
INTRAMUSCULAR | Status: DC | PRN
  Administered 2011-08-14: 2 mg via INTRAVENOUS

## 2011-08-14 MED ORDER — LACTATED RINGERS IV SOLN
INTRAVENOUS | Status: DC | PRN
  Administered 2011-08-14: 12:00:00 via INTRAVENOUS

## 2011-08-14 MED ORDER — POTASSIUM CHLORIDE IN NACL 20-0.9 MEQ/L-% IV SOLN
INTRAVENOUS | Status: DC
  Administered 2011-08-14 – 2011-08-18 (×11): via INTRAVENOUS
  Filled 2011-08-14 (×11): qty 1000

## 2011-08-14 MED ORDER — FENTANYL CITRATE 0.05 MG/ML IJ SOLN
INTRAMUSCULAR | Status: DC | PRN
  Administered 2011-08-14 (×6): 50 ug via INTRAVENOUS

## 2011-08-14 MED ORDER — SODIUM CHLORIDE 0.9 % IV SOLN
1.0000 g | INTRAVENOUS | Status: AC
  Administered 2011-08-14: 1 g via INTRAVENOUS

## 2011-08-14 MED ORDER — ALVIMOPAN 12 MG PO CAPS
12.0000 mg | ORAL_CAPSULE | Freq: Two times a day (BID) | ORAL | Status: DC
  Administered 2011-08-15 – 2011-08-19 (×9): 12 mg via ORAL
  Filled 2011-08-14 (×10): qty 1

## 2011-08-14 MED ORDER — SODIUM CHLORIDE 0.9 % IV SOLN
1.0000 g | INTRAVENOUS | Status: AC
  Administered 2011-08-15: 1 g via INTRAVENOUS
  Filled 2011-08-14: qty 1

## 2011-08-14 MED ORDER — ROCURONIUM BROMIDE 100 MG/10ML IV SOLN
INTRAVENOUS | Status: DC | PRN
  Administered 2011-08-14: 40 mg via INTRAVENOUS
  Administered 2011-08-14 (×2): 10 mg via INTRAVENOUS

## 2011-08-14 MED ORDER — ONDANSETRON HCL 4 MG PO TABS: 4.0000 mg | ORAL_TABLET | Freq: Four times a day (QID) | ORAL | Status: DC | PRN

## 2011-08-14 MED ORDER — HEPARIN SODIUM (PORCINE) 5000 UNIT/ML IJ SOLN
5000.0000 [IU] | Freq: Three times a day (TID) | INTRAMUSCULAR | Status: DC
  Administered 2011-08-15 – 2011-08-19 (×12): 5000 [IU] via SUBCUTANEOUS
  Filled 2011-08-14 (×15): qty 1

## 2011-08-14 MED ORDER — HYDROMORPHONE HCL PF 1 MG/ML IJ SOLN
INTRAMUSCULAR | Status: DC | PRN
  Administered 2011-08-14 (×2): 0.5 mg via INTRAVENOUS
  Administered 2011-08-14: 1 mg via INTRAVENOUS

## 2011-08-14 MED ORDER — SODIUM CHLORIDE 0.9 % IV SOLN
INTRAVENOUS | Status: AC
  Filled 2011-08-14: qty 1

## 2011-08-14 SURGICAL SUPPLY — 48 items
APPLICATOR COTTON TIP 6IN STRL (MISCELLANEOUS) ×6 IMPLANT
BLADE EXTENDED COATED 6.5IN (ELECTRODE) IMPLANT
BLADE HEX COATED 2.75 (ELECTRODE) ×3 IMPLANT
BLADE SURG SZ10 CARB STEEL (BLADE) ×3 IMPLANT
CANISTER SUCTION 2500CC (MISCELLANEOUS) ×3 IMPLANT
CLIP TI LARGE 6 (CLIP) IMPLANT
CLOTH BEACON ORANGE TIMEOUT ST (SAFETY) ×3 IMPLANT
COVER MAYO STAND STRL (DRAPES) ×3 IMPLANT
DRAPE LAPAROSCOPIC ABDOMINAL (DRAPES) ×3 IMPLANT
DRAPE LG THREE QUARTER DISP (DRAPES) IMPLANT
DRAPE POUCH INSTRU U-SHP 10X18 (DRAPES) ×3 IMPLANT
DRAPE WARM FLUID 44X44 (DRAPE) ×3 IMPLANT
DRESSING TELFA 8X3 (GAUZE/BANDAGES/DRESSINGS) ×3 IMPLANT
ELECT REM PT RETURN 9FT ADLT (ELECTROSURGICAL) ×3
ELECTRODE REM PT RTRN 9FT ADLT (ELECTROSURGICAL) ×2 IMPLANT
GLOVE BIOGEL PI IND STRL 7.0 (GLOVE) ×2 IMPLANT
GLOVE BIOGEL PI INDICATOR 7.0 (GLOVE) ×1
GLOVE EUDERMIC 7 POWDERFREE (GLOVE) ×3 IMPLANT
GOWN STRL NON-REIN LRG LVL3 (GOWN DISPOSABLE) ×3 IMPLANT
GOWN STRL REIN XL XLG (GOWN DISPOSABLE) ×6 IMPLANT
HAND ACTIVATED (MISCELLANEOUS) IMPLANT
KIT BASIN OR (CUSTOM PROCEDURE TRAY) ×3 IMPLANT
LEGGING LITHOTOMY PAIR STRL (DRAPES) ×3 IMPLANT
LIGASURE IMPACT 36 18CM CVD LR (INSTRUMENTS) ×3 IMPLANT
NS IRRIG 1000ML POUR BTL (IV SOLUTION) ×6 IMPLANT
PACK GENERAL/GYN (CUSTOM PROCEDURE TRAY) ×3 IMPLANT
SPONGE GAUZE 4X4 12PLY (GAUZE/BANDAGES/DRESSINGS) ×3 IMPLANT
STAPLER PROXIMATE 75MM BLUE (STAPLE) ×3 IMPLANT
STAPLER VISISTAT 35W (STAPLE) ×3 IMPLANT
SUCTION POOLE TIP (SUCTIONS) ×3 IMPLANT
SUT NOV 1 T60/GS (SUTURE) ×3 IMPLANT
SUT NOVA NAB DX-16 0-1 5-0 T12 (SUTURE) IMPLANT
SUT NOVA T20/GS 25 (SUTURE) IMPLANT
SUT PDS AB 1 CTX 36 (SUTURE) IMPLANT
SUT PDS AB 1 TP1 96 (SUTURE) ×6 IMPLANT
SUT PROLENE 2 0 KS (SUTURE) IMPLANT
SUT SILK 2 0 (SUTURE) ×1
SUT SILK 2 0 SH CR/8 (SUTURE) ×12 IMPLANT
SUT SILK 2 0SH CR/8 30 (SUTURE) IMPLANT
SUT SILK 2-0 18XBRD TIE 12 (SUTURE) ×2 IMPLANT
SUT SILK 3 0 (SUTURE) ×1
SUT SILK 3 0 SH CR/8 (SUTURE) ×3 IMPLANT
SUT SILK 3-0 18XBRD TIE 12 (SUTURE) ×2 IMPLANT
TAPE CLOTH SURG 4X10 WHT LF (GAUZE/BANDAGES/DRESSINGS) ×3 IMPLANT
TOWEL OR 17X26 10 PK STRL BLUE (TOWEL DISPOSABLE) ×6 IMPLANT
TRAY FOLEY CATH 14FRSI W/METER (CATHETERS) ×3 IMPLANT
WATER STERILE IRR 1500ML POUR (IV SOLUTION) IMPLANT
YANKAUER SUCT BULB TIP NO VENT (SUCTIONS) ×3 IMPLANT

## 2011-08-14 NOTE — Transfer of Care (Signed)
Immediate Anesthesia Transfer of Care Note  Patient: Annette Gentry Patient  Procedure(s) Performed: Procedure(s) (LRB): COLOSTOMY TAKEDOWN (N/A) PROCTOSCOPY (N/A)  Patient Location: PACU  Anesthesia Type: General  Level of Consciousness: sedated, patient cooperative and responds to stimulaton  Airway & Oxygen Therapy: Patient Spontanous Breathing and Patient connected to face mask oxgen  Post-op Assessment: Report given to PACU RN and Post -op Vital signs reviewed and stable  Post vital signs: Reviewed and stable  Complications: No apparent anesthesia complications

## 2011-08-14 NOTE — Anesthesia Postprocedure Evaluation (Signed)
  Anesthesia Post-op Note  Patient: Annette Gentry  Procedure(s) Performed: Procedure(s) (LRB): COLOSTOMY TAKEDOWN (N/A) PROCTOSCOPY (N/A)  Patient Location: PACU  Anesthesia Type: General  Level of Consciousness: awake and alert   Airway and Oxygen Therapy: Patient Spontanous Breathing  Post-op Pain: mild  Post-op Assessment: Post-op Vital signs reviewed, Patient's Cardiovascular Status Stable, Respiratory Function Stable, Patent Airway and No signs of Nausea or vomiting  Post-op Vital Signs: stable  Complications: No apparent anesthesia complications

## 2011-08-14 NOTE — Anesthesia Preprocedure Evaluation (Signed)
Anesthesia Evaluation  Patient identified by MRN, date of birth, ID band Patient awake  General Assessment Comment:Infection precautions  Reviewed: Allergy & Precautions, H&P , NPO status , Patient's Chart, lab work & pertinent test results, reviewed documented beta blocker date and time   Airway Mallampati: II TM Distance: >3 FB Neck ROM: Full    Dental  (+) Teeth Intact   Pulmonary neg pulmonary ROS,  breath sounds clear to auscultation        Cardiovascular negative cardio ROS  Rhythm:Regular Rate:Normal  Denies cardiac symptoms   Neuro/Psych negative neurological ROS  negative psych ROS   GI/Hepatic (+) Hepatitis -, BColostomy reversal   Endo/Other  negative endocrine ROS  Renal/GU negative Renal ROS  negative genitourinary   Musculoskeletal negative musculoskeletal ROS (+)   Abdominal   Peds negative pediatric ROS (+)  Hematology negative hematology ROS (+)   Anesthesia Other Findings   Reproductive/Obstetrics negative OB ROS                           Anesthesia Physical Anesthesia Plan  ASA: II  Anesthesia Plan: General   Post-op Pain Management:    Induction: Intravenous  Airway Management Planned: Oral ETT  Additional Equipment:   Intra-op Plan:   Post-operative Plan: Extubation in OR  Informed Consent: I have reviewed the patients History and Physical, chart, labs and discussed the procedure including the risks, benefits and alternatives for the proposed anesthesia with the patient or authorized representative who has indicated his/her understanding and acceptance.   Dental advisory given  Plan Discussed with: CRNA and Surgeon  Anesthesia Plan Comments:         Anesthesia Quick Evaluation

## 2011-08-14 NOTE — Progress Notes (Signed)
Pt has nasal congestion today. Lungs clear

## 2011-08-14 NOTE — Interval H&P Note (Signed)
History and Physical Interval Note:  08/14/2011 11:33 AM  Annette Gentry  has presented today for surgery, with the diagnosis of diverticulitis  The goals of treatment and the various methods of treatment have been discussed with the patient and family. After consideration of risks, benefits and other options for treatment, the patient has consented to  Procedure(s) (LRB): COLOSTOMY TAKEDOWN (N/A) as a surgical intervention .  The patients' history has been reviewed, patient examined, no change in status, stable for surgery.  I have reviewed the patients' chart and labs.  Questions were answered to the patient's satisfaction.     [name]

## 2011-08-14 NOTE — Op Note (Signed)
Patient Name:           Annette Gentry   Date of Surgery:        08/14/2011  Pre op Diagnosis:      Diverticulitis, history sigmoid colectomy with colostomy  Post op Diagnosis:    same  Procedure:               Resection of colostomy, resection rectal stump, closure of colostomy    Surgeon:                     Angelia Mould. Derrell Lolling, M.D., FACS  Assistant:                      Loretha Brasil, M.D., [facility]  Operative Indications:   This is a 56 year old female who has had mild asthma, Sjgren's syndrome, heart murmur. She underwent emergent sigmoid colectomy with colostomy for perforated diverticulitis on 04/22/2011. She has recovered from that. She desires colostomy closure. We have studied her proximal and distal colon and it appears suitable for re anastomosis. She has undergone bowel prep at home and is brought to the operating room electively.  Operative Findings:       The rectal stump was fairly long and so I actually resected about 3 or 4 inches of rectosigmoid colon so that the anastomosis was at the level of the sacral promontory. I also resected about 3 inches of the colostomy to freshen that up and to make sure there were no residual diverticula. There were moderate soft adhesions but no other pathology.  Procedure in Detail:          Following the induction of general endotracheal anesthesia a Foley catheter was placed., An oral gastric tube was placed. The patient was placed in the rigid padded stirrups. A pursestring suture was placed in the colostomy to occlude that. The abdomen and perineal area were prepped and draped in a sterile fashion. Surgical time out was performed. Intravenous antibiotics were given.  Lower midline incision was made through the previous scar. The fascia was incised in the midline and the abdominal cavity carefully entered. We slowly opened the incision and took down the omental adhesions from the abdominal wall. We continued the exploration down into the  pelvis. We found that the rectal stump was of suitable length and there were a couple of loops of small bowel adherent to the rectal stump. These were dissected away with sharp dissection. We were careful not to injure the small bowel. The small bowel was then packed away. We then made an incision around the colostomy on the skin and dissected the colostomy away from the skin and the fascia. The scar tissue here was fairly dense but ultimately we were able to return the colostomy and colon to the abdominal cavity. We resected about 3 or 4 inches of the colostomy and we still had excellent length to stretch down into the pelvis. We transected the colostomy with the GIA stapling device. I decided to resect about 3 inches of rectal stump. We divided the rectosigmoid between Allen clamps and took down the mesentery with the LigaSure. Both the colostomy and the rectal stump were sent as specimens to the lab.  We set up the anastomosis. It appeared to be most suitable for a handsewn end-to-end anastomosis. We made sure there was no twisting of the proximal or distal colon. Corner sutures of 2-0 silk were placed. The back wall of the anastomosis was performed  with interrupted sutures of 2-0 silk. I turned the corners in with interrupted inverting sutures of 2-0 silk and completed the anterior wall with interrupted Lembert sutures of 2-0 silk. We had excellent blood supply and no tension on the anastomosis. The anastomosis lay just below the sacral promontory.  Dr. Magnus Ivan performed a rigid proctoscopy while I occluded the colon above the anastomosis. The colon distended nicely and there were no air bubbles or leak. There was no apparent bleeding. We removed all the air. We could palpate the anastomosis with our fingers.  We changed our instruments and gloves at this point. We irrigated out the abdomen and pelvis extensively. There did not appear to be any bleeding. We returned the small bowel and omentum to their  anatomic positions. The midline fascia was closed with a running suture of #1 double stranded PDS. The fascia at the colostomy site was closed with numerous interrupted sutures of #1 Novofil. The skin incisions were closed loosely with staples and I placed Telfa wicks in between for drainage. Clean bandages were placed. The patient was taken recovery in stable condition. Estimated blood loss proximally 150 cc. Complications none. Counts correct. Again this is Dr. Claud Kelp M. Derrell Lolling, M.D., FACS General and Minimally Invasive Surgery Breast and Colorectal Surgery  08/14/2011 2:45 PM

## 2011-08-15 LAB — BASIC METABOLIC PANEL
BUN: 9 mg/dL (ref 6–23)
Chloride: 108 mEq/L (ref 96–112)
Creatinine, Ser: 0.7 mg/dL (ref 0.50–1.10)
Glucose, Bld: 115 mg/dL — ABNORMAL HIGH (ref 70–99)
Potassium: 4.1 mEq/L (ref 3.5–5.1)

## 2011-08-15 LAB — CBC
HCT: 35 % — ABNORMAL LOW (ref 36.0–46.0)
Hemoglobin: 11.6 g/dL — ABNORMAL LOW (ref 12.0–15.0)
MCH: 30.5 pg (ref 26.0–34.0)
MCHC: 33.1 g/dL (ref 30.0–36.0)
MCV: 92.1 fL (ref 78.0–100.0)

## 2011-08-15 MED ORDER — FLUTICASONE PROPIONATE 50 MCG/ACT NA SUSP
2.0000 | Freq: Every day | NASAL | Status: DC
  Administered 2011-08-16 – 2011-08-18 (×4): 2 via NASAL
  Filled 2011-08-15: qty 16

## 2011-08-15 NOTE — Progress Notes (Signed)
1 Day Post-Op  Subjective: Stable and alert. No nausea. No flatus. Pain control good. Lab work looks good.  Objective: Vital signs in last 24 hours: Temp:  [96.8 F (36 C)-98.9 F (37.2 C)] 98.8 F (37.1 C) (03/16 0600) Pulse Rate:  [62-86] 77  (03/16 0600) Resp:  [9-19] 18  (03/16 0600) BP: (111-133)/(60-74) 114/62 mmHg (03/16 0600) SpO2:  [96 %-100 %] 98 % (03/16 0600) Weight:  [143 lb 4.8 oz (65 kg)] 143 lb 4.8 oz (65 kg) (03/15 1700) Last BM Date: 08/14/11  Intake/Output from previous day: 03/15 0701 - 03/16 0700 In: 2500 [I.V.:2500] Out: 1825 [Urine:1750; Blood:75] Intake/Output this shift: Total I/O In: 0  Out: 950 [Urine:950]  General appearance: alert Resp: clear to auscultation bilaterally GI: abdomen is soft, minimally tender. Almost no bowel sounds. Not distended. Bandage clean and dry. No bleeding.  Lab Results:  Results for orders placed during the hospital encounter of 08/14/11 (from the past 24 hour(s))  TYPE AND SCREEN     Status: Normal   Collection Time   08/14/11 10:00 AM      Component Value Range   ABO/RH(D) A POS     Antibody Screen NEG     Sample Expiration 08/17/2011    ABO/RH     Status: Normal   Collection Time   08/14/11 10:00 AM      Component Value Range   ABO/RH(D) A POS    CBC     Status: Abnormal   Collection Time   08/14/11  4:48 PM      Component Value Range   WBC 13.5 (*) 4.0 - 10.5 (K/uL)   RBC 4.01  3.87 - 5.11 (MIL/uL)   Hemoglobin 12.7  12.0 - 15.0 (g/dL)   HCT 16.1  09.6 - 04.5 (%)   MCV 91.8  78.0 - 100.0 (fL)   MCH 31.7  26.0 - 34.0 (pg)   MCHC 34.5  30.0 - 36.0 (g/dL)   RDW 40.9  81.1 - 91.4 (%)   Platelets 185  150 - 400 (K/uL)  CREATININE, SERUM     Status: Normal   Collection Time   08/14/11  4:48 PM      Component Value Range   Creatinine, Ser 0.69  0.50 - 1.10 (mg/dL)   GFR calc non Af Amer >90  >90 (mL/min)   GFR calc Af Amer >90  >90 (mL/min)  BASIC METABOLIC PANEL     Status: Abnormal   Collection Time   08/15/11  4:25 AM      Component Value Range   Sodium 139  135 - 145 (mEq/L)   Potassium 4.1  3.5 - 5.1 (mEq/L)   Chloride 108  96 - 112 (mEq/L)   CO2 27  19 - 32 (mEq/L)   Glucose, Bld 115 (*) 70 - 99 (mg/dL)   BUN 9  6 - 23 (mg/dL)   Creatinine, Ser 7.82  0.50 - 1.10 (mg/dL)   Calcium 8.9  8.4 - 95.6 (mg/dL)   GFR calc non Af Amer >90  >90 (mL/min)   GFR calc Af Amer >90  >90 (mL/min)  CBC     Status: Abnormal   Collection Time   08/15/11  4:25 AM      Component Value Range   WBC 7.7  4.0 - 10.5 (K/uL)   RBC 3.80 (*) 3.87 - 5.11 (MIL/uL)   Hemoglobin 11.6 (*) 12.0 - 15.0 (g/dL)   HCT 21.3 (*) 08.6 - 46.0 (%)   MCV 92.1  78.0 - 100.0 (fL)   MCH 30.5  26.0 - 34.0 (pg)   MCHC 33.1  30.0 - 36.0 (g/dL)   RDW 45.4  09.8 - 11.9 (%)   Platelets 181  150 - 400 (K/uL)     Studies/Results: @RISRSLT24 @     . alvimopan  12 mg Oral Once  . alvimopan  12 mg Oral BID  . cycloSPORINE  1 drop Both Eyes BID  . ertapenem  1 g Intravenous 60 min Pre-Op  . ertapenem (INVANZ) IV  1 g Intravenous Q24H  . heparin      . heparin  5,000 Units Subcutaneous Once  . heparin  5,000 Units Subcutaneous Q8H  . norethindrone-ethinyl estradiol  1 tablet Oral Daily  . polyvinyl alcohol  1 drop Both Eyes Daily  . DISCONTD: norethindrone-ethinyl estradiol  1 tablet Oral Daily     Assessment/Plan: s/p Procedure(s): COLOSTOMY TAKEDOWN PROCTOSCOPY  POD #1. Stable  Begin clear liquids. Mobilize out of bed, ambulate as tolerated. entereg protocol Remove Foley catheter tomorrow  Patient Active Hospital Problem List: No active hospital problems.   LOS: 1 day    Trayce Caravello M. Derrell Lolling, M.D., [facility] General and Minimally invasive Surgery Breast and Colorectal Surgery Office:   [phone] Pager:   [phone]  08/15/2011  . .prob

## 2011-08-16 NOTE — Progress Notes (Signed)
Pt complaining of sinus issues and nasal stuffiness.  Patient takes nasonex at home.  Notified Dr Derrell Lolling.  Order given.  Informed patient.

## 2011-08-16 NOTE — Progress Notes (Signed)
2 Days Post-Op  Subjective: Alert and stable. Ambulating in halls. A little transient nausea seems to have resolved. No flatus or stool. Pain control seems pretty good.  Objective: Vital signs in last 24 hours: Temp:  [98.3 F (36.8 C)-98.4 F (36.9 C)] 98.4 F (36.9 C) (03/17 0500) Pulse Rate:  [71-81] 80  (03/17 0500) Resp:  [18] 18  (03/17 0500) BP: (110-118)/(65-71) 118/71 mmHg (03/17 0500) SpO2:  [96 %-98 %] 97 % (03/17 0500) Last BM Date: 08/14/11  Intake/Output from previous day: 03/16 0701 - 03/17 0700 In: 4655.8 [P.O.:600; I.V.:4055.8] Out: 2750 [Urine:2750] Intake/Output this shift:    General appearance: alert Resp: clear to auscultation bilaterally GI: abdomen is generally soft. Hypoactive bowel sounds. Slightly distended. Dressing is clean and dry.  Lab Results:  No results found for this or any previous visit (from the past 24 hour(s)).   Studies/Results: @RISRSLT24 @     . alvimopan  12 mg Oral BID  . cycloSPORINE  1 drop Both Eyes BID  . ertapenem (INVANZ) IV  1 g Intravenous Q24H  . fluticasone  2 spray Each Nare Daily  . heparin  5,000 Units Subcutaneous Q8H  . norethindrone-ethinyl estradiol  1 tablet Oral Daily  . polyvinyl alcohol  1 drop Both Eyes Daily     Assessment/Plan: s/p Procedure(s): COLOSTOMY TAKEDOWN PROCTOSCOPY  Stable POD #2. DC Foley this morning Continue clear liquids until ileus resolve Entereg Ambulate in halls.    Angelia Mould. Derrell Lolling, M.D., [facility] General and Minimally invasive Surgery Breast and Colorectal Surgery Office:   [phone] Pager:   [phone]  Patient Active Hospital Problem List: No active hospital problems.   LOS: 2 days    Wise Fees M 08/16/2011  . .prob

## 2011-08-17 ENCOUNTER — Telehealth (INDEPENDENT_AMBULATORY_CARE_PROVIDER_SITE_OTHER): Payer: Self-pay

## 2011-08-17 LAB — CBC
MCH: 30.9 pg (ref 26.0–34.0)
MCHC: 33.6 g/dL (ref 30.0–36.0)
MCV: 91.8 fL (ref 78.0–100.0)
Platelets: 160 10*3/uL (ref 150–400)
RDW: 12.4 % (ref 11.5–15.5)

## 2011-08-17 MED ORDER — LIDOCAINE-EPINEPHRINE 2 %-1:100000 IJ SOLN
20.0000 mL | Freq: Once | INTRAMUSCULAR | Status: AC
  Administered 2011-08-17: 20 mL
  Filled 2011-08-17: qty 20

## 2011-08-17 NOTE — Progress Notes (Signed)
Intervention performed by Zola Button was discussed with me, and I am in full agreement with the intervention performed. There has been no further bleeding this afternoon the patient has otherwise done well. Hemoglobin is stable.

## 2011-08-17 NOTE — Progress Notes (Signed)
3 Days Post-Op  Subjective: Stable and alert. Minimal pain. No nausea. She thinks she may have passed some flatus. He has not had any stool. Tolerating clear liquids. Voiding well. Angulating in the halls.  Objective: Vital signs in last 24 hours: Temp:  [97.9 F (36.6 C)-99.3 F (37.4 C)] 97.9 F (36.6 C) (03/18 0545) Pulse Rate:  [82-84] 83  (03/18 0545) Resp:  [18] 18  (03/18 0545) BP: (118-121)/(68-72) 121/72 mmHg (03/18 0545) SpO2:  [95 %-99 %] 97 % (03/18 0545) Last BM Date: 08/14/11  Intake/Output from previous day: 03/17 0701 - 03/18 0700 In: 2137 [P.O.:660; I.V.:1477] Out: 5050 [Urine:5050] Intake/Output this shift:    General appearance: alert. It looks comfortable. No distress. GI: abdomen is soft. Not really distended. Bowel sounds present. Wounds clean. Telfa wicks in place.  Lab Results:  No results found for this or any previous visit (from the past 24 hour(s)).   Studies/Results: @RISRSLT24 @     . alvimopan  12 mg Oral BID  . cycloSPORINE  1 drop Both Eyes BID  . fluticasone  2 spray Each Nare Daily  . heparin  5,000 Units Subcutaneous Q8H  . norethindrone-ethinyl estradiol  1 tablet Oral Daily  . polyvinyl alcohol  1 drop Both Eyes Daily     Assessment/Plan: s/p Procedure(s): COLOSTOMY TAKEDOWN PROCTOSCOPY  POD #3, stable Full liquid diet Wound care, remove wicks. Entereg Attempt transition to oral analgesics. Await return of bowel function   Patient Active Hospital Problem List: No active hospital problems.   LOS: 3 days    Collin Hendley M 08/17/2011  . .prob

## 2011-08-17 NOTE — Progress Notes (Signed)
Pt dressing changed this morning and tefla wicks removed per MD order.  Pt began to bleed from midline incision.  Dry gauze dressing placed.  Pt continued to bleed from incision, soaking 2 dressing within 1 hour.  MD notified and nurse told to apply pressure for 5 minutes and redress with gauze.  Pt soaked through 2 more dressings.  PA on unit notified and assessed pt.  PA put in 4 sutures at bedside.  MD called and aware of findings, agreed with suture placement.

## 2011-08-17 NOTE — Progress Notes (Signed)
Called to room by nursing staff.  Wicks removed from wound and she developed an arterial bleeder from the skin.  Not controlled by direct pressure, compression or ice.   I applied direct pressure to wound site.  Then after equipment obtained injected skin edges with 2%lidocaine with epinephrine , 10 ml and then used 4 3-0 nylon sutures to control skin edge bleeding. Pt. Tolerated the procedure well. Dr. Derrell Lolling aware and agree's.

## 2011-08-17 NOTE — Anesthesia Postprocedure Evaluation (Signed)
  Anesthesia Post-op Note  Patient: Annette Gentry  Procedure(s) Performed: Procedure(s) (LRB): COLOSTOMY TAKEDOWN (N/A) PROCTOSCOPY (N/A)  Patient Location: PACU  Anesthesia Type: General  Level of Consciousness: oriented and sedated  Airway and Oxygen Therapy: Patient Spontanous Breathing and Patient connected to nasal cannula oxygen  Post-op Pain: mild  Post-op Assessment: Post-op Vital signs reviewed, Patient's Cardiovascular Status Stable, Respiratory Function Stable and Patent Airway  Post-op Vital Signs: stable  Complications: No apparent anesthesia complications

## 2011-08-17 NOTE — Telephone Encounter (Signed)
Dr. Derrell Lolling paged to call [place] @ [facility] [phone]

## 2011-08-17 NOTE — Plan of Care (Signed)
Problem: Food- and Nutrition-Related Knowledge Deficit (NB-1.1) Goal: Nutrition education Formal process to instruct or train a patient/client in a skill or to impart knowledge to help patients/clients voluntarily manage or modify food choices and eating behavior to maintain or improve health.  Outcome: Completed/Met Date Met:  08/17/11 Provided pt with handouts on high fiber diet and low fat diet. Discussed sources of fiber in the diet with an emphasis on adequate fluid intake as fiber intake increases. Encouraged gradual addition of fiber back into the diet as MD advises. Also reviewed sources of dietary fat and how to limit their intake. Pt expressed understanding. RD contact information provided.

## 2011-08-18 NOTE — Plan of Care (Signed)
Problem: Phase II Progression Outcomes Goal: Return of bowel function (flatus, BM) IF ABDOMINAL SURGERY:  Outcome: Completed/Met Date Met:  08/18/11 Bowel movements watery and green

## 2011-08-18 NOTE — Progress Notes (Addendum)
4 Days Post-Op  Subjective: Patient had bleeding from the skin edges in the lower  midline incision after wicks were removed yesterday. This required suture for control. She has had no more bleeding and feels fine now. CBC showed hemoglobin was stable at 11.3.  She is otherwise doing well. Towering full liquids. Has had several small watery stools. Denies cramps or nausea or pain.               pathology report shows benign colonic tissue.  Objective: Vital signs in last 24 hours: Temp:  [97.4 F (36.3 C)-98.7 F (37.1 C)] 97.4 F (36.3 C) (03/19 0628) Pulse Rate:  [80-90] 80  (03/19 0628) Resp:  [18] 18  (03/19 0628) BP: (119-127)/(70-78) 119/70 mmHg (03/19 0628) SpO2:  [97 %-98 %] 97 % (03/19 0628) Last BM Date: 08/17/11  Intake/Output from previous day: 03/18 0701 - 03/19 0700 In: 3528.2 [I.V.:3528.2] Out: 3300 [Urine:3300] Intake/Output this shift: Total I/O In: 1165.2 [I.V.:1165.2] Out: 2100 [Urine:2100]  General appearance: alert. Pleasant.  In no distress. GI: abdomen soft and nontender. Active bowel sounds. Minimally distended. Midline wound and left lower quadrant wound clean and dry. Staples and sutures noted.  Lab Results:  Results for orders placed during the hospital encounter of 08/14/11 (from the past 24 hour(s))  CBC     Status: Abnormal   Collection Time   08/17/11 11:50 AM      Component Value Range   WBC 5.3  4.0 - 10.5 (K/uL)   RBC 3.66 (*) 3.87 - 5.11 (MIL/uL)   Hemoglobin 11.3 (*) 12.0 - 15.0 (g/dL)   HCT 96.0 (*) 45.4 - 46.0 (%)   MCV 91.8  78.0 - 100.0 (fL)   MCH 30.9  26.0 - 34.0 (pg)   MCHC 33.6  30.0 - 36.0 (g/dL)   RDW 09.8  11.9 - 14.7 (%)   Platelets 160  150 - 400 (K/uL)     Studies/Results: @RISRSLT24 @     . alvimopan  12 mg Oral BID  . cycloSPORINE  1 drop Both Eyes BID  . fluticasone  2 spray Each Nare Daily  . heparin  5,000 Units Subcutaneous Q8H  . lidocaine-EPINEPHrine  20 mL Infiltration Once  . norethindrone-ethinyl  estradiol  1 tablet Oral Daily  . polyvinyl alcohol  1 drop Both Eyes Daily     Assessment/Plan: s/p Procedure(s): COLOSTOMY TAKEDOWN PROCTOSCOPY  POD #4, stable Transient wound hemorrhage, skin bleeders, expect this will be self-limited. Ileus resolving,   advance to low residue diet. Decrease IV fluids Possible discharge home tomorrow.    LOS: 4 days    Sheranda Seabrooks M. Derrell Lolling, M.D., [facility] General and Minimally invasive Surgery Breast and Colorectal Surgery Office:   [phone] Pager:   [phone]  08/18/2011  . .prob

## 2011-08-18 NOTE — Plan of Care (Signed)
Problem: Phase I Progression Outcomes Goal: Sutures/staples intact Outcome: Completed/Met Date Met:  08/18/11 Staples and new sutures applied by Zola Button, PA intact to midline abd

## 2011-08-19 MED ORDER — HYDROCODONE-ACETAMINOPHEN 5-325 MG PO TABS: 1.0000 | ORAL_TABLET | ORAL | Status: AC | PRN

## 2011-08-19 NOTE — Progress Notes (Signed)
5 Days Post-Op  Subjective: Feels well. Stable and alert.  Objective: Vital signs in last 24 hours: Temp:  [97.4 F (36.3 C)-98.8 F (37.1 C)] 98.7 F (37.1 C) (03/20 0527) Pulse Rate:  [76-96] 82  (03/20 0527) Resp:  [16-18] 16  (03/20 0527) BP: (102-119)/(66-73) 115/71 mmHg (03/20 0527) SpO2:  [96 %-98 %] 97 % (03/20 0527) Last BM Date: 08/18/11  Intake/Output from previous day: 03/19 0701 - 03/20 0700 In: 916.9 [P.O.:120; I.V.:796.9] Out: 2850 [Urine:2850] Intake/Output this shift: Total I/O In: -  Out: 700 [Urine:700]  General appearance: alert. No distress. Comfortable. Good spirits. GI: abdomen soft and nontender. Wounds healing without obvious complication. No drainage or infection. Normal bowel sounds. Not distended.  Lab Results:  No results found for this or any previous visit (from the past 24 hour(s)).   Studies/Results: @RISRSLT24 @     . alvimopan  12 mg Oral BID  . cycloSPORINE  1 drop Both Eyes BID  . fluticasone  2 spray Each Nare Daily  . heparin  5,000 Units Subcutaneous Q8H  . norethindrone-ethinyl estradiol  1 tablet Oral Daily  . polyvinyl alcohol  1 drop Both Eyes Daily     Assessment/Plan: s/p Procedure(s): COLOSTOMY TAKEDOWN PROCTOSCOPY  POD #5, stable Discharge home today. Hydration and high-fiber low-fat diet discussed and emphasized. Activities discussed. Return to see me in one week for staple removal. Rx for vicodin  Patient Active Hospital Problem List: No active hospital problems.   LOS: 5 days    Shelly Shoultz M. Derrell Lolling, M.D., [facility] General and Minimally invasive Surgery Breast and Colorectal Surgery Office:   [phone] Pager:   [phone]  08/19/2011  . .prob

## 2011-08-19 NOTE — Discharge Summary (Signed)
Patient ID: Annette Gentry [id number] 56 y.o. 1955-11-15  08/14/2011  Discharge date and time: August 19, 2011  Admitting Physician: [name]  Discharge Physician: [name]  Admission Diagnoses: diverticulitis  Discharge Diagnoses: diverticulitis with colostomy  Operations: Procedure(s): COLOSTOMY TAKEDOWN PROCTOSCOPY  Admission Condition: good  Discharged Condition: good  Indication for Admission: . 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 She had a Hartman resection and has recovered and is returned to full activities.  I reviewed her colonoscopy from [facility] in 2008 and it showed only a few sigmoid diverticula. Ultrasound of her gallbladder is normal. A barium enema shows the proximal colon looks normal. The distal rectosigmoid stump shows a little bit of irregularity at the stapled end so we probably will need to resect a little bit of the rectal stump but the distal end is quite long.  [name] Her medical conditions have been stable. She underwent a bowel prep at home and is brought to the operating room electively.   Hospital Course: on the day of admission the patient was taken to the operating room. She was explored through a short lower midline incision. The rectal stump was mobilized and resected. The colostomy was mobilized and resected. End-to-end anastomosis was performed with a single layer of interrupted silk sutures. The surgery was uneventful.  Pathology report showed benign colonic tissue and diverticula.  The patient had an ileus for a couple days but as this resolved as we were able to advance her diet to a low-residue diet which she tolerated well. The Foley catheter was removed and she voided uneventfully. On postoperative day 3 we removed the Telfa wicks from her wound and she had some bleeding from the skin edge of the lower midline which required a couple of sutures to control.  Hemoglobin was 11.3 after that and was stable. She had no other problems. She resumed  diet activities uneventfully.  On the day of discharge she was tolerating a low-residue diet he had cervical so loose stools. She had no cramps or pain. Wound was healing without complication.  She was given instructions and hydration, diet, and activities. She was given a prescription for Vicodin. She was asked to return to see me in the office in one week for staple removal.  Consults: None  Significant Diagnostic Studies: histopathology  Treatments: surgery: resection and closure of colostomy.  Disposition: Home  Patient Instructions:   [place], [place]  Home Medication Instructions [id number]   Printed on:08/19/11 0553  Medication Information                    cycloSPORINE (RESTASIS) 0.05 % ophthalmic emulsion Place 1 drop into both eyes 2 (two) times daily.             Multiple Vitamins-Minerals (MULTIVITAMINS THER. W/MINERALS) TABS Take 1 tablet by mouth daily.             Cholecalciferol (VITAMIN D) 2000 UNITS tablet Take 2,000 Units by mouth daily.             naproxen sodium (ANAPROX) 220 MG tablet Take 220-440 mg by mouth once as needed. For pain           norethindrone-ethinyl estradiol (MICROGESTIN,JUNEL,LOESTRIN) 1-20 MG-MCG tablet Take 1 tablet by mouth daily.           methylcellulose (ARTIFICIAL TEARS) 1 % ophthalmic solution Place 1 drop into both eyes daily.  Activity: no lifting, driving, or strenuous exercise for 2-3 weeks. Diet: llow fat, high fiber diet with emphasis on hydration. Wound Care: as directed  Follow-up:  With Dr. Derrell Lolling in 1 week.  Signed: Angelia Mould. Derrell Lolling, M.D., FACS General and minimally invasive surgery Breast and Colorectal Surgery  08/19/2011, 5:53 AM

## 2011-08-20 ENCOUNTER — Telehealth (INDEPENDENT_AMBULATORY_CARE_PROVIDER_SITE_OTHER): Payer: Self-pay

## 2011-08-20 NOTE — Telephone Encounter (Signed)
Pt notified of po appt 08-26-11 for staple removal.

## 2011-08-26 ENCOUNTER — Ambulatory Visit (INDEPENDENT_AMBULATORY_CARE_PROVIDER_SITE_OTHER): Payer: BC Managed Care – PPO | Admitting: General Surgery

## 2011-08-26 ENCOUNTER — Encounter (HOSPITAL_COMMUNITY): Payer: Self-pay | Admitting: General Surgery

## 2011-08-26 VITALS — BP 104/62 | HR 68 | Temp 99.2°F | Resp 16 | Ht 67.0 in | Wt 137.4 lb

## 2011-08-26 DIAGNOSIS — K5720 Diverticulitis of large intestine with perforation and abscess without bleeding: Secondary | ICD-10-CM

## 2011-08-26 DIAGNOSIS — K5732 Diverticulitis of large intestine without perforation or abscess without bleeding: Secondary | ICD-10-CM

## 2011-08-26 NOTE — Patient Instructions (Signed)
You seem to be recovering from your colostomy closure surgery without any complications. Advance your diet and activities as we discussed. No sports or heavy lifting for 5 weeks from the date of surgery.  Return to see Dr. Derrell Lolling in 6 weeks.

## 2011-08-26 NOTE — Progress Notes (Signed)
Subjective:     Patient ID: Annette Gentry, female   DOB: [birth year], 56 y.o.   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 She has done well. She is slowly transitioning her diet to a higher fiber diet and staying hydrated. She's having on average 2 formed bowel movements per day. No fevers. No unusual pain. She has not return to work yet. She is walking outside in the neighborhood. Review of Systems     Objective:   Physical Exam Patient looks well. She is in good spirits. Her husband is with her. Vital signs looked good.  Abdomen is soft and nontender. I removed all the staples and sutures from the wound. There is no drainage no signs of infection.    Assessment:     Diverticulitis, recovering uneventfully 12 days postop resection and closure of colostomy    Plan:     Diet and activities discussed. No sports or heavy lifting for 5 weeks from the date of surgery, then un- limited activities.  Return to see me in 6 weeks for a final check  Advised colonoscopy every 5 years for colorectal cancer screening. She is almost due for that this year    Alencia Gordon M. Derrell Lolling, M.D., [facility] General and Minimally invasive Surgery Breast and Colorectal Surgery Office:   [phone] Pager:   [phone]   .

## 2011-09-07 ENCOUNTER — Other Ambulatory Visit (HOSPITAL_COMMUNITY): Payer: Self-pay | Admitting: Family Medicine

## 2011-09-07 DIAGNOSIS — Z1231 Encounter for screening mammogram for malignant neoplasm of breast: Secondary | ICD-10-CM

## 2011-09-24 ENCOUNTER — Encounter (INDEPENDENT_AMBULATORY_CARE_PROVIDER_SITE_OTHER): Payer: Self-pay

## 2011-10-01 ENCOUNTER — Ambulatory Visit (HOSPITAL_COMMUNITY)
Admission: RE | Admit: 2011-10-01 | Discharge: 2011-10-01 | Disposition: A | Discharge status: Home / Self Care | Payer: BC Managed Care – PPO | Source: Ambulatory Visit | Attending: Family Medicine | Admitting: Family Medicine

## 2011-10-01 DIAGNOSIS — Z1231 Encounter for screening mammogram for malignant neoplasm of breast: Secondary | ICD-10-CM | POA: Insufficient documentation

## 2011-10-08 ENCOUNTER — Ambulatory Visit (INDEPENDENT_AMBULATORY_CARE_PROVIDER_SITE_OTHER): Payer: BC Managed Care – PPO | Admitting: General Surgery

## 2011-10-08 ENCOUNTER — Encounter (INDEPENDENT_AMBULATORY_CARE_PROVIDER_SITE_OTHER): Payer: Self-pay | Admitting: General Surgery

## 2011-10-08 VITALS — BP 115/66 | HR 68 | Temp 98.7°F | Ht 67.0 in | Wt 138.2 lb

## 2011-10-08 DIAGNOSIS — K5732 Diverticulitis of large intestine without perforation or abscess without bleeding: Secondary | ICD-10-CM

## 2011-10-08 DIAGNOSIS — K5720 Diverticulitis of large intestine with perforation and abscess without bleeding: Secondary | ICD-10-CM

## 2011-10-08 NOTE — Patient Instructions (Signed)
You have recovered from your colostomy closure surgery without any obvious complications. All of your wounds have healed well.  You may continue normal physical activities without restriction. I recommend high fiber low fat diet, lots of fluid intake, and regular exercise.  I advise you to call Dr. Dorena Cookey and set up another colonoscopy at some point in the future for colorectal cancer screening.  Return to see Dr. Derrell Lolling if any further problems arise.

## 2011-10-08 NOTE — Progress Notes (Signed)
Subjective:     Patient ID: Annette Gentry, female   DOB: 16-Mar-1956, 56 y.o.   MRN: [id number]  HPI This patient underwent resection and closure of colostomy on August 14, 2011. Her original diagnosis was perforated diverticulitis.  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 Appetite normal. No abdominal pain. Normal bowel function.  She asked about colorectal cancer screening since it has been almost 5 years since her last colonoscopy with Dr. Dorena Cookey.  Review of Systems     Objective:   Physical Exam This patient looks well. She is in no distress.  Abdomen soft and nontender. Midline scar and left lower quadrant colostomy site a well-healed. There are no hernias. Scar is strong. No drainage. Nontender.    Assessment:     Perforated diverticulitis, uneventful recovery following elective resection and closure of colostomy.    Plan:     Continue normal physical activities without restriction. Regular exercise advised.  Recommend high-fiber, low-fat diet with hydration.  Patient advised to contact Dr. Dorena Cookey for elective colonoscopy for colorectal cancer screening  Return to see me p.r.n.   Angelia Mould. Derrell Lolling, M.D., [facility] General and Minimally invasive Surgery Breast and Colorectal Surgery Office:   [phone] Pager:   [phone]

## 2012-02-23 ENCOUNTER — Ambulatory Visit
Admission: RE | Admit: 2012-02-23 | Discharge: 2012-02-23 | Disposition: A | Discharge status: Home / Self Care | Payer: BC Managed Care – PPO | Source: Ambulatory Visit | Attending: Family Medicine | Admitting: Family Medicine

## 2012-02-23 ENCOUNTER — Other Ambulatory Visit: Payer: Self-pay | Admitting: Family Medicine

## 2012-02-23 DIAGNOSIS — R591 Generalized enlarged lymph nodes: Secondary | ICD-10-CM

## 2012-02-23 MED ORDER — IOHEXOL 300 MG/ML  SOLN
75.0000 mL | Freq: Once | INTRAMUSCULAR | Status: AC | PRN
  Administered 2012-02-23: 75 mL via INTRAVENOUS

## 2012-02-26 ENCOUNTER — Other Ambulatory Visit: Payer: Self-pay | Admitting: Family Medicine

## 2012-02-26 DIAGNOSIS — R9389 Abnormal findings on diagnostic imaging of other specified body structures: Secondary | ICD-10-CM

## 2012-03-01 ENCOUNTER — Ambulatory Visit
Admission: RE | Admit: 2012-03-01 | Discharge: 2012-03-01 | Disposition: A | Discharge status: Home / Self Care | Payer: BC Managed Care – PPO | Source: Ambulatory Visit | Attending: Family Medicine | Admitting: Family Medicine

## 2012-03-01 DIAGNOSIS — R9389 Abnormal findings on diagnostic imaging of other specified body structures: Secondary | ICD-10-CM

## 2012-03-02 ENCOUNTER — Other Ambulatory Visit: Payer: Self-pay | Admitting: Family Medicine

## 2012-03-02 DIAGNOSIS — E041 Nontoxic single thyroid nodule: Secondary | ICD-10-CM

## 2012-03-08 ENCOUNTER — Inpatient Hospital Stay
Admission: RE | Admit: 2012-03-08 | Discharge: 2012-03-08 | Payer: BC Managed Care – PPO | Source: Ambulatory Visit | Attending: Family Medicine | Admitting: Family Medicine

## 2012-03-09 ENCOUNTER — Other Ambulatory Visit (HOSPITAL_COMMUNITY)
Admission: RE | Admit: 2012-03-09 | Discharge: 2012-03-09 | Disposition: A | Discharge status: Home / Self Care | Payer: BC Managed Care – PPO | Source: Ambulatory Visit | Attending: Interventional Radiology | Admitting: Interventional Radiology

## 2012-03-09 ENCOUNTER — Ambulatory Visit
Admission: RE | Admit: 2012-03-09 | Discharge: 2012-03-09 | Disposition: A | Discharge status: Home / Self Care | Payer: BC Managed Care – PPO | Source: Ambulatory Visit | Attending: Family Medicine | Admitting: Family Medicine

## 2012-03-09 DIAGNOSIS — E049 Nontoxic goiter, unspecified: Secondary | ICD-10-CM | POA: Insufficient documentation

## 2012-03-09 DIAGNOSIS — E041 Nontoxic single thyroid nodule: Secondary | ICD-10-CM

## 2012-07-21 ENCOUNTER — Other Ambulatory Visit: Payer: Self-pay | Admitting: Family Medicine

## 2012-07-22 ENCOUNTER — Ambulatory Visit
Admission: RE | Admit: 2012-07-22 | Discharge: 2012-07-22 | Disposition: A | Discharge status: Home / Self Care | Payer: BC Managed Care – PPO | Source: Ambulatory Visit | Attending: Family Medicine | Admitting: Family Medicine

## 2012-07-27 ENCOUNTER — Other Ambulatory Visit: Payer: Self-pay | Admitting: Family Medicine

## 2012-07-27 DIAGNOSIS — R1903 Right lower quadrant abdominal swelling, mass and lump: Secondary | ICD-10-CM

## 2012-08-01 ENCOUNTER — Ambulatory Visit
Admission: RE | Admit: 2012-08-01 | Discharge: 2012-08-01 | Disposition: A | Discharge status: Home / Self Care | Payer: BC Managed Care – PPO | Source: Ambulatory Visit | Attending: Family Medicine | Admitting: Family Medicine

## 2012-08-19 ENCOUNTER — Encounter: Payer: Self-pay | Admitting: Hematology

## 2012-08-22 ENCOUNTER — Encounter: Payer: Self-pay | Admitting: Obstetrics and Gynecology

## 2012-08-22 ENCOUNTER — Ambulatory Visit (INDEPENDENT_AMBULATORY_CARE_PROVIDER_SITE_OTHER): Payer: BC Managed Care – PPO | Admitting: Obstetrics and Gynecology

## 2012-08-22 VITALS — BP 102/60 | Wt 144.0 lb

## 2012-08-22 DIAGNOSIS — D251 Intramural leiomyoma of uterus: Secondary | ICD-10-CM

## 2012-08-22 NOTE — Patient Instructions (Signed)
OK to follow up with Dr. Kevan Ny.  Discontinue birth control pills at end of this pack.  That may allow the fibroid to shrink.  I do not feel the mass is the fibroid.

## 2012-08-22 NOTE — Progress Notes (Signed)
57 yo mwf here for re-evaluation of fibroids.  Pt was having abdominal pain and saw Dr. Kevan Ny who ordered a PUS, which was done Mar 3 and showed a multifibroid uterus, the largest fibroid being 4 x 4 x 4 cm posteriorly.  In Nov 2012, a CT scan done at the time of evaluation for a ruptured diverticulum reported the uterus to be "prominent" with probably fibroids, so apparently the fibroids have increased in size since that time.  I saw her in 2011 and on my exam her uterus was 10 week size with two small fibroids, each about 2 cm.  Dr. Kevan Ny had ordered a CT scan but it was never done because insurance requested a PUS first.    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  Pt noticed a lump in her right abdomen about two months ago.  When she lays down, she feels it near her right hip.  She feels it is mobile.  It is not painful.  She does have cramping in between her periods.  Menses are still fairly light, since she is on OC's.    ROS otherwise neg.  Exam:  Alert WF in NAD.  And soft and non tender.  No palpable masses.  Pt cannot feel any mass today either.  Bowel sounds normal.  No inguinal adenopathy.    Pelvic:  Ext, BUS, vagina and cerivx appear normal.. On bimanual exam, the uterus itself is normal size, but there is an easily palpable posterior firm mass, presumably the fibroid described on ultrasound.  This mass is about 4 cm in all dimensions, and is NOT mobile.  Adnexa are normal.  Rectovaginal is confirmatory.  A:  4 cm posterior fibroid.    P:  I do not believe this fibroid is that mass the patient was feeling in her abdomen.  It is quite posterior and lies behind the symphysis, and is not at all in the right location to be palpated through her anterior abdominal wall.  I wonder if the "mass" is actually stool passing through her colon.  She is so thin and her abdominal wall is quite lax, and it is possible that she could have been feeling stool in transit through her  colon.    Regarding her fibroid, since she is 57 years old and no longer requires birth control, I have recommended that she strop her OC's to see if the fibroid will shrink in response to a lack of estrogen.  Pt is eager to try that, saying her sister also has fibroids and hers shrunk considerably in menopause.  She knows to return if she has any irregular or heavy bleeding when off her OC's.

## 2012-10-03 ENCOUNTER — Other Ambulatory Visit (HOSPITAL_COMMUNITY): Payer: Self-pay | Admitting: Family Medicine

## 2012-10-03 DIAGNOSIS — Z1231 Encounter for screening mammogram for malignant neoplasm of breast: Secondary | ICD-10-CM

## 2012-10-04 ENCOUNTER — Ambulatory Visit (HOSPITAL_COMMUNITY)
Admission: RE | Admit: 2012-10-04 | Discharge: 2012-10-04 | Disposition: A | Discharge status: Home / Self Care | Payer: BC Managed Care – PPO | Source: Ambulatory Visit | Attending: Family Medicine | Admitting: Family Medicine

## 2012-10-04 DIAGNOSIS — Z1231 Encounter for screening mammogram for malignant neoplasm of breast: Secondary | ICD-10-CM | POA: Insufficient documentation

## 2013-03-22 IMAGING — US US SOFT TISSUE HEAD/NECK
1 series · 14 of 25 positions shown · non-contrast
Comparison: CT 02/23/2012

CLINICAL DATA: Cystic right thyroid lesion seen on cervical CT.

THYROID ULTRASOUND
TECHNIQUE: Ultrasound examination of the thyroid gland and adjacent
soft tissues was performed.

[Series 1: us soft tissue head/neck · 0.08mm/px · 14 of 70 slices shown]
[im 1/70]
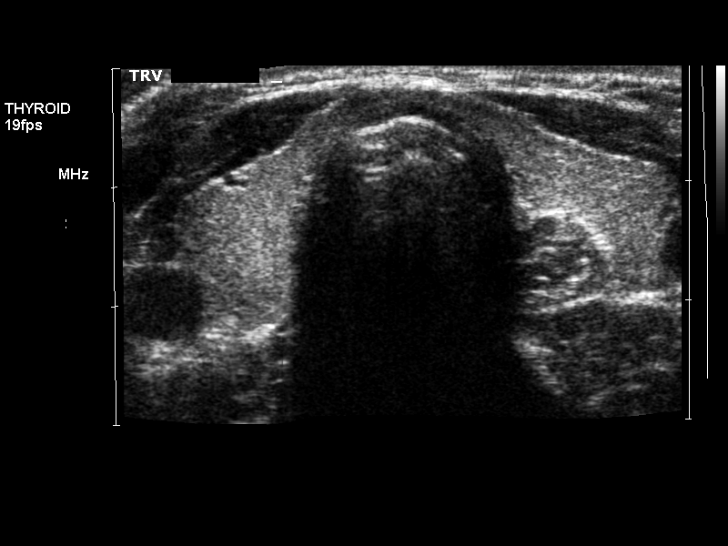
[im 6/70]
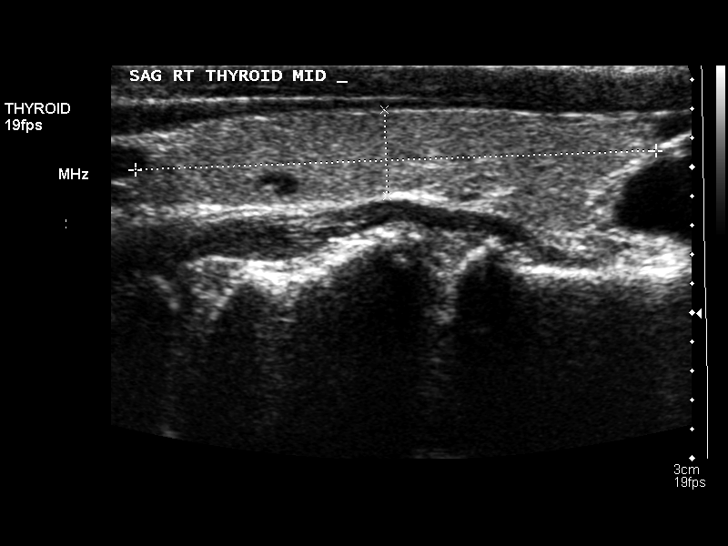
[im 12/70]
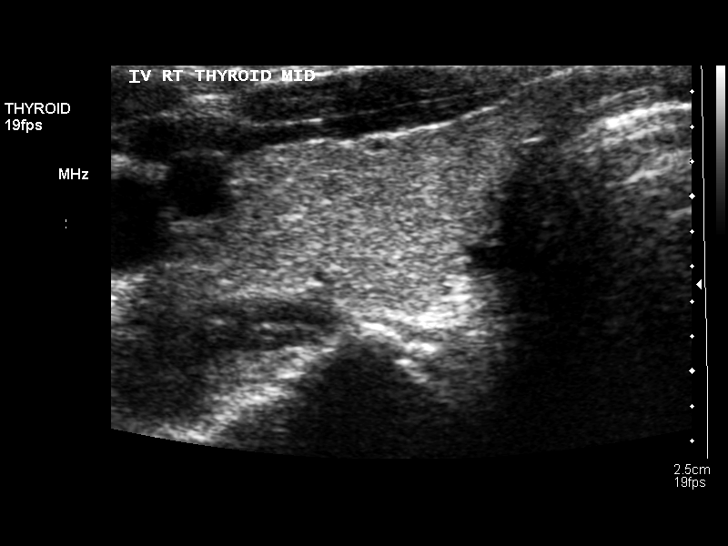
[im 18/70]
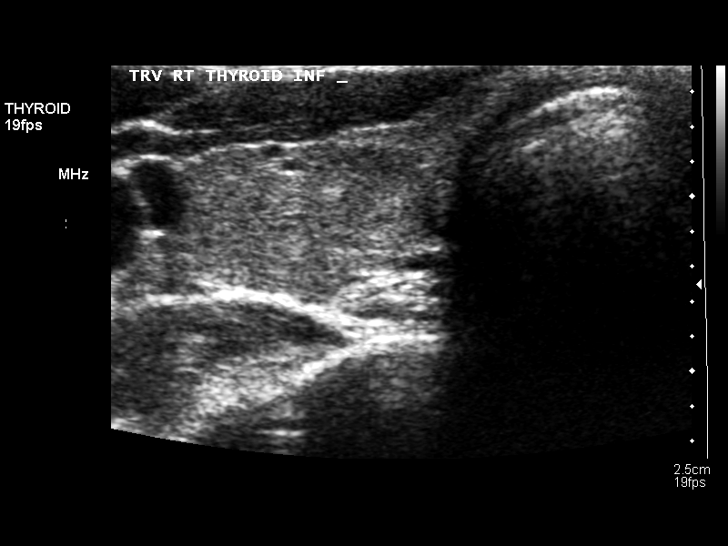
[im 24/70]
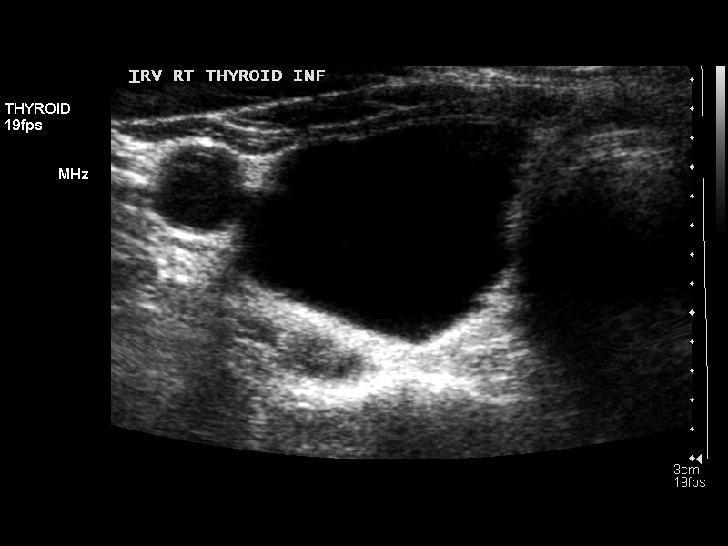
[im 26/70]
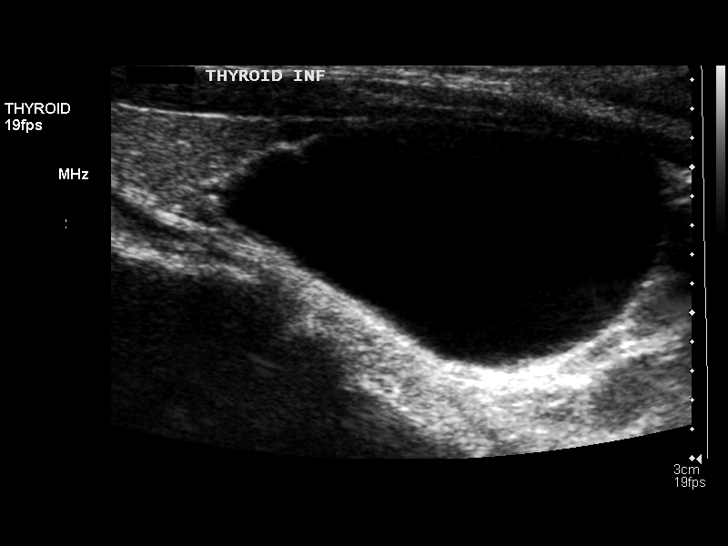
[im 32/70]
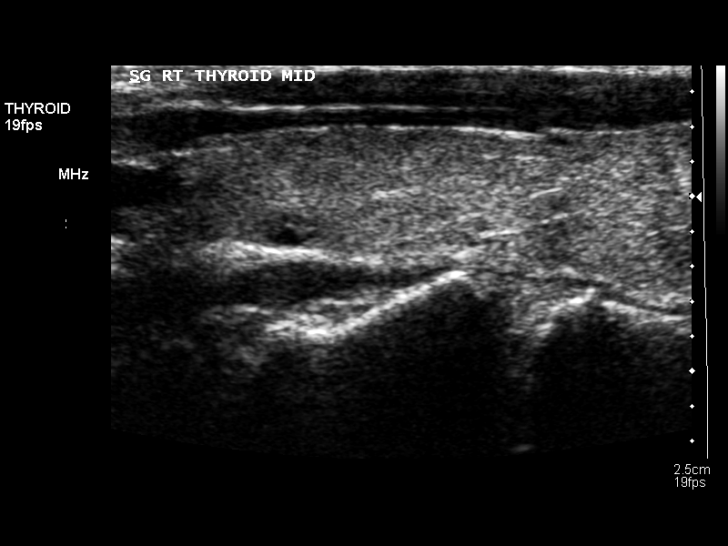
[im 38/70]
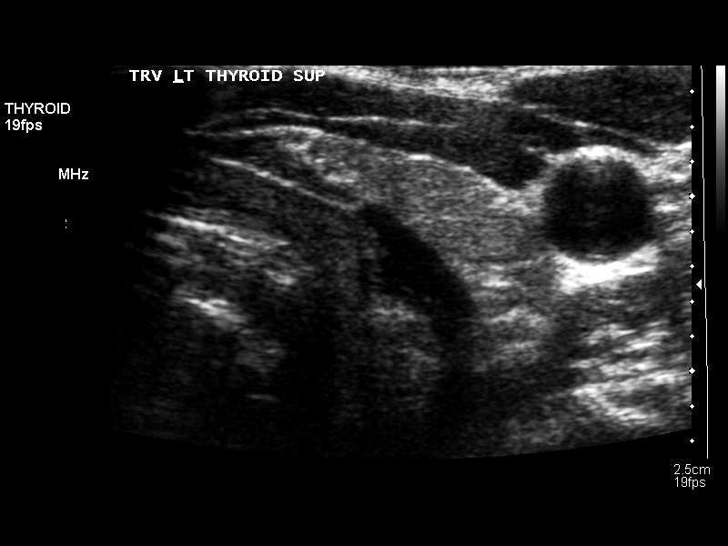
[im 44/70]
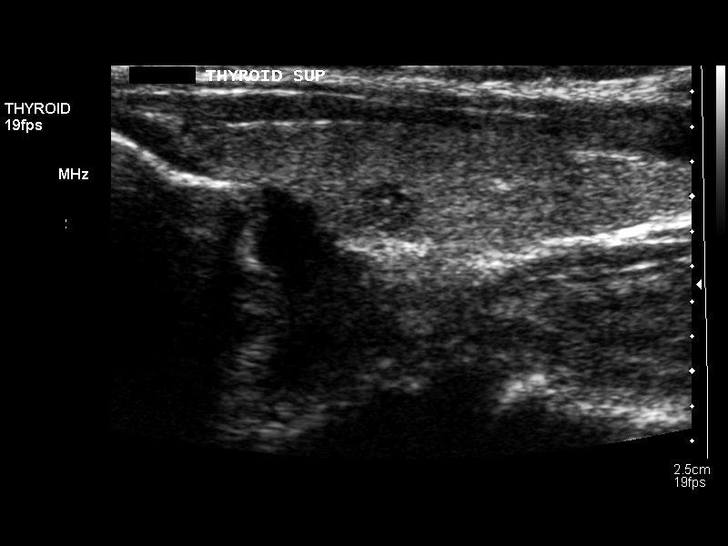
[im 47/70]
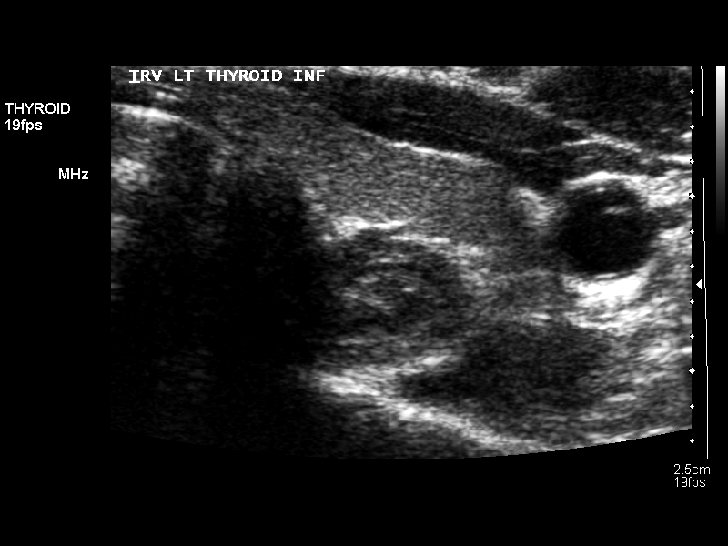
[im 52/70]
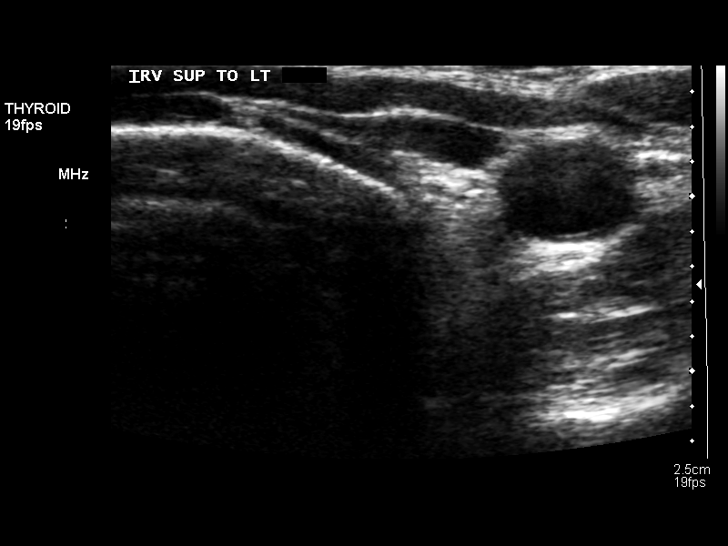
[im 58/70]
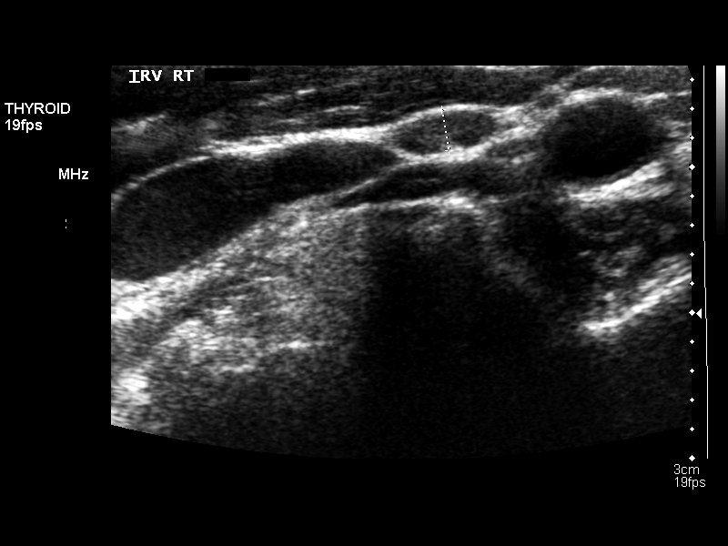
[im 64/70]
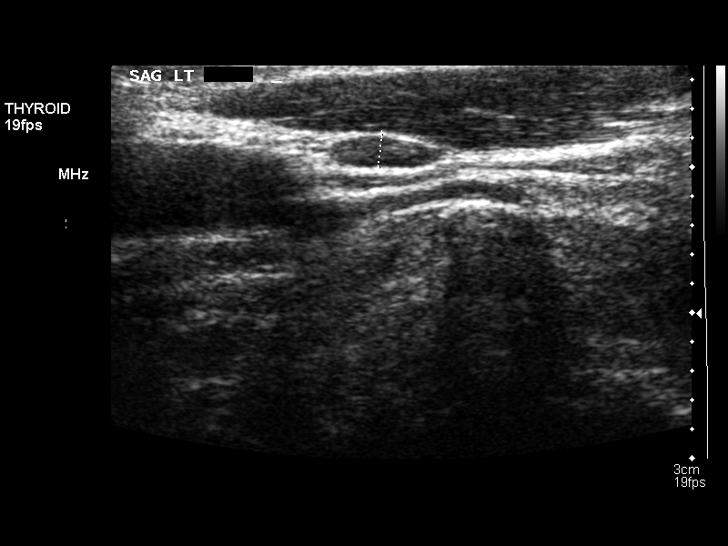
[im 70/70]
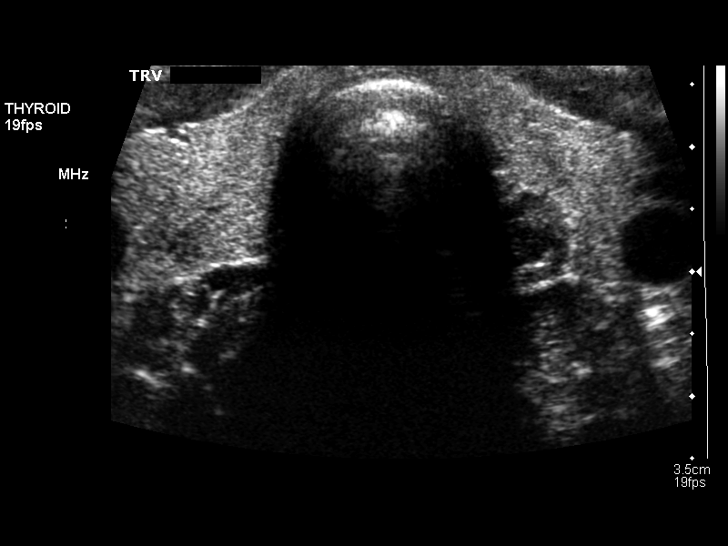

[14 of 25 positions shown; findings below may reference images not displayed]

FINDINGS: Right thyroid lobe:  14 x 36 x 59 mm, homogeneous background
echotexture
Left thyroid lobe:  9 x 15 x 37 mm
Isthmus:  1.6 in thickness

Focal nodules:  7 x  7 x 8 mm hypoechoic solid, inferior right
15 x 19 x 31 mm cystic, inferior right
There are least four additional small bilateral lesions all
measuring under 6 mm maximal diameter.

Lymphadenopathy:  None visualized.
IMPRESSION: 1.  3.1 cm right cystic lesion corresponding to the previous
findings. Findings meet consensus criteria for biopsy.  Ultrasound-
guided fine needle aspiration should be considered, as per the
consensus statement: Management of Thyroid Nodules Detected at US:
Society of Radiologists in Ultrasound Consensus Conference

## 2013-10-03 ENCOUNTER — Other Ambulatory Visit (HOSPITAL_COMMUNITY): Payer: Self-pay | Admitting: Family Medicine

## 2013-10-03 DIAGNOSIS — Z1231 Encounter for screening mammogram for malignant neoplasm of breast: Secondary | ICD-10-CM

## 2013-10-11 ENCOUNTER — Ambulatory Visit (HOSPITAL_COMMUNITY)
Admission: RE | Admit: 2013-10-11 | Discharge: 2013-10-11 | Disposition: A | Discharge status: Home / Self Care | Payer: BC Managed Care – PPO | Source: Ambulatory Visit | Attending: Family Medicine | Admitting: Family Medicine

## 2013-10-11 DIAGNOSIS — Z1231 Encounter for screening mammogram for malignant neoplasm of breast: Secondary | ICD-10-CM | POA: Insufficient documentation

## 2013-11-24 ENCOUNTER — Encounter: Payer: Self-pay | Admitting: Gynecology

## 2013-11-24 ENCOUNTER — Ambulatory Visit (INDEPENDENT_AMBULATORY_CARE_PROVIDER_SITE_OTHER): Payer: BC Managed Care – PPO | Admitting: Gynecology

## 2013-11-24 VITALS — BP 110/60 | HR 72 | Ht 67.25 in | Wt 143.0 lb

## 2013-11-24 DIAGNOSIS — Z01419 Encounter for gynecological examination (general) (routine) without abnormal findings: Secondary | ICD-10-CM

## 2013-11-24 DIAGNOSIS — N924 Excessive bleeding in the premenopausal period: Secondary | ICD-10-CM

## 2013-11-24 DIAGNOSIS — D259 Leiomyoma of uterus, unspecified: Secondary | ICD-10-CM

## 2013-11-24 LAB — POCT URINALYSIS DIPSTICK
BILIRUBIN UA: NEGATIVE
Glucose, UA: NEGATIVE
KETONES UA: NEGATIVE
LEUKOCYTES UA: NEGATIVE
Nitrite, UA: NEGATIVE
PH UA: 6
PROTEIN UA: NEGATIVE
RBC UA: NEGATIVE
Urobilinogen, UA: NEGATIVE

## 2013-11-24 NOTE — Patient Instructions (Signed)
Levonorgestrel intrauterine device (IUD) What is this medicine? LEVONORGESTREL IUD (LEE voe nor jes trel) is a contraceptive (birth control) device. The device is placed inside the uterus by a healthcare professional. It is used to prevent pregnancy and can also be used to treat heavy bleeding that occurs during your period. Depending on the device, it can be used for 3 to 5 years. This medicine may be used for other purposes; ask your health care provider or pharmacist if you have questions. COMMON BRAND NAME(S): Mirena, Skyla What should I tell my health care provider before I take this medicine? They need to know if you have any of these conditions: -abnormal Pap smear -cancer of the breast, uterus, or cervix -diabetes -endometritis -genital or pelvic infection now or in the past -have more than one sexual partner or your partner has more than one partner -heart disease -history of an ectopic or tubal pregnancy -immune system problems -IUD in place -liver disease or tumor -problems with blood clots or take blood-thinners -use intravenous drugs -uterus of unusual shape -vaginal bleeding that has not been explained -an unusual or allergic reaction to levonorgestrel, other hormones, silicone, or polyethylene, medicines, foods, dyes, or preservatives -pregnant or trying to get pregnant -breast-feeding How should I use this medicine? This device is placed inside the uterus by a health care professional. Talk to your pediatrician regarding the use of this medicine in children. Special care may be needed. Overdosage: If you think you have taken too much of this medicine contact a poison control center or emergency room at once. NOTE: This medicine is only for you. Do not share this medicine with others. What if I miss a dose? This does not apply. What may interact with this medicine? Do not take this medicine with any of the following  medications: -amprenavir -bosentan -fosamprenavir This medicine may also interact with the following medications: -aprepitant -barbiturate medicines for inducing sleep or treating seizures -bexarotene -griseofulvin -medicines to treat seizures like carbamazepine, ethotoin, felbamate, oxcarbazepine, phenytoin, topiramate -modafinil -pioglitazone -rifabutin -rifampin -rifapentine -some medicines to treat HIV infection like atazanavir, indinavir, lopinavir, nelfinavir, tipranavir, ritonavir -St. John's wort -warfarin This list may not describe all possible interactions. Give your health care provider a list of all the medicines, herbs, non-prescription drugs, or dietary supplements you use. Also tell them if you smoke, drink alcohol, or use illegal drugs. Some items may interact with your medicine. What should I watch for while using this medicine? Visit your doctor or health care professional for regular check ups. See your doctor if you or your partner has sexual contact with others, becomes HIV positive, or gets a sexual transmitted disease. This product does not protect you against HIV infection (AIDS) or other sexually transmitted diseases. You can check the placement of the IUD yourself by reaching up to the top of your vagina with clean fingers to feel the threads. Do not pull on the threads. It is a good habit to check placement after each menstrual period. Call your doctor right away if you feel more of the IUD than just the threads or if you cannot feel the threads at all. The IUD may come out by itself. You may become pregnant if the device comes out. If you notice that the IUD has come out use a backup birth control method like condoms and call your health care provider. Using tampons will not change the position of the IUD and are okay to use during your period. What side effects may I   notice from receiving this medicine? Side effects that you should report to your doctor or  health care professional as soon as possible: -allergic reactions like skin rash, itching or hives, swelling of the face, lips, or tongue -fever, flu-like symptoms -genital sores -high blood pressure -no menstrual period for 6 weeks during use -pain, swelling, warmth in the leg -pelvic pain or tenderness -severe or sudden headache -signs of pregnancy -stomach cramping -sudden shortness of breath -trouble with balance, talking, or walking -unusual vaginal bleeding, discharge -yellowing of the eyes or skin Side effects that usually do not require medical attention (report to your doctor or health care professional if they continue or are bothersome): -acne -breast pain -change in sex drive or performance -changes in weight -cramping, dizziness, or faintness while the device is being inserted -headache -irregular menstrual bleeding within first 3 to 6 months of use -nausea This list may not describe all possible side effects. Call your doctor for medical advice about side effects. You may report side effects to FDA at 1-800-FDA-1088. Where should I keep my medicine? This does not apply. NOTE: This sheet is a summary. It may not cover all possible information. If you have questions about this medicine, talk to your doctor, pharmacist, or health care provider.  2015, Elsevier/Gold Standard. (2011-06-18 13:54:04)  

## 2013-11-24 NOTE — Progress Notes (Signed)
58 y.o. married Caucasian female   G1P1 here for annual exam. Pt reports menses regular but very heavy. Pt states cycles are every 3w, 1-2d heavy with clots, some are very large.  She does not report hot flashes, does not have night sweats, does have vaginal dryness.  She is using lubricants. Pt states that she just started iron for anemia.  Is using old diaphragm.   Patient's last menstrual period was 11/18/2013.          Sexually active: Yes.    The current method of family planning is diaphragm.    Exercising: Yes.    Home exercise routine includes walking and stretching. Last pap: unsure Abnormal PAP: over 35 years agi Mammogram: 10/11/2013, WNL  BSE: NO Colonoscopy: per pt, July 2014, has scarring from previous surgery DEXA: per pt over 5 years, borderline osteopenia Alcohol: yes, moderate (5-7) per week Tobacco: NO Urine: Normal    Labs: Done with PCP 1 mths ago  Health Maintenance  Topic Date Due  . Tetanus/tdap  06/03/1975  . Colonoscopy  06/02/2006  . Pap Smear  08/20/2013  . Influenza Vaccine  12/30/2013  . Mammogram  10/12/2014    Family History  Problem Relation Age of Onset  . Hyperlipidemia Mother   . Hyperlipidemia Father   . Hyperlipidemia Sister   . Hyperlipidemia Brother     Patient Active Problem List   Diagnosis Date Noted  . Personal history of colonic polyps 04/23/2011  . Diverticulitis of colon with perforation 04/20/2011  . Sjoegren syndrome 04/17/2011  . Asthma 04/17/2011    Past Medical History  Diagnosis Date  . Heart murmur   . Asthma   . Diverticulitis of colon with perforation 04/20/2011  . Personal history of colonic polyps 04/23/2011  . Palpitations     CARDIAC STUDIES DONE 2009 - WAS TOLD THEY WERE NORMAL  . Arthritis   . Sjogren's syndrome   . Osteopenia   . Rash, skin     AROUND STOMA  . 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 Blood transfusion     AS INFANT    Past Surgical History  Procedure Laterality Date  . Hernia repair     . Colon resection  04/22/2011    Procedure: COLON RESECTION;  Surgeon: Adin Hector, MD;  Location: [facility];  Service: General;  Laterality: N/A;  colon resection, drainage of abscess  . Colostomy  04/22/2011    Procedure: COLOSTOMY;  Surgeon: Adin Hector, MD;  Location: [facility];  Service: General;  Laterality: N/A;  . Myoma on right thigh  2002 (summer)  . Knee mass excision  1970'S    OSTEOCHONDROMA REMOVED  . Foreign removed  AGE 30    PEANUT REMOVED FROM LUNG  . Proctoscopy  08/14/2011    Procedure: PROCTOSCOPY;  Surgeon: Adin Hector, MD;  Location: [facility];  Service: General;  Laterality: N/A;  rigid  . Colon surgery  04/2011,07/2011    colostomy take down    Allergies: Codeine and Polyethyl glycol-propyl glycol  Current Outpatient Prescriptions  Medication Sig Dispense Refill  . Cholecalciferol (VITAMIN D) 2000 UNITS tablet Take 1,000 Units by mouth daily.       . cycloSPORINE (RESTASIS) 0.05 % ophthalmic emulsion Place 1 drop into both eyes 2 (two) times daily.        [name] loratadine (CLARITIN) 10 MG tablet Take 10 mg by mouth as needed for allergies.      . methylcellulose (ARTIFICIAL TEARS) 1 % ophthalmic  solution Place 1 drop into both eyes daily.      . Multiple Vitamins-Minerals (MULTIVITAMINS THER. W/MINERALS) TABS Take 1 tablet by mouth daily.        . naproxen sodium (ANAPROX) 220 MG tablet Take 220-440 mg by mouth once as needed. For pain      . norethindrone-ethinyl estradiol (MICROGESTIN,JUNEL,LOESTRIN) 1-20 MG-MCG tablet Take 1 tablet by mouth daily.       No current facility-administered medications for this visit.    ROS: Pertinent items are noted in HPI.  Exam:    BP 110/60  Pulse 72  Ht 5' 7.25" (1.708 m)  Wt 143 lb (64.864 kg)  BMI 22.23 kg/m2  LMP 11/18/2013 Weight change: @WEIGHTCHANGE @ Last 3 height recordings:  Ht Readings from Last 3 Encounters:  11/24/13 5' 7.25" (1.708 m)  10/08/11 5\' 7"  (1.702 m)  08/26/11 5\' 7"  (1.702 m)   General  appearance: alert, cooperative and appears stated age Head: Normocephalic, without obvious abnormality, atraumatic Neck: no adenopathy, no carotid bruit, no JVD, supple, symmetrical, trachea midline and thyroid not enlarged, symmetric, no tenderness/mass/nodules Lungs: clear to auscultation bilaterally Breasts: normal appearance, no masses or tenderness Heart: regular rate and rhythm, S1, S2 normal, no murmur, click, rub or gallop Abdomen: soft, non-tender; bowel sounds normal; no masses,  no organomegaly Extremities: extremities normal, atraumatic, no cyanosis or edema Skin: Skin color, texture, turgor normal. No rashes or lesions Lymph nodes: Cervical, supraclavicular, and axillary nodes normal. no inguinal nodes palpated Neurologic: Grossly normal   Pelvic: External genitalia:  no lesions              Urethra: normal appearing urethra with no masses, tenderness or lesions              Bartholins and Skenes: Bartholin's, Urethra, Skene's normal                 Vagina: normal appearing vagina with normal color and discharge, no lesions              Cervix: normal appearance and anterior deviation              Pap taken: No.        Bimanual Exam:  Uterus:  uterus is normal size, shape, consistency and nontender, retroflexed, posterior fibroid impinging on posterior vagina                                      Adnexa:    no masses                                      Rectovaginal: Confirms, uterus mobile                                      Anus:  normal sphincter tone, no lesions  1. Routine gynecological examination counseled on breast self exam, mammography screening, adequate intake of calcium and vitamin D, diet and exercise return annually or prn  - POCT Urinalysis Dipstick  2. Menorrhagia, premenopausal Pt had been on ocp for menorrhagia, now using old diaphragm.  Pt informed old diaphragm should be refitted or change in contraception considered depending on evaluation.   Information on IUD provided.  Will get labs from Dr  Gates and if not done [facility] and f/u cbc due to anemia.  - [place] Sonohysterogram; Future - Endometrial biopsy; Future  3. Uterine leiomyoma, unspecified location Reviewed from 2014, posterior fundal fibroid noted, lining 5.6mm  - [place] Sonohysterogram; Future  Discussed PAP guideline changes, importance of weight bearing exercises, calcium, vit D and balanced diet.  An After Visit Summary was printed and given to the patient.

## 2013-11-29 ENCOUNTER — Telehealth: Payer: Self-pay | Admitting: Gynecology

## 2013-11-29 NOTE — Telephone Encounter (Signed)
Spoke with patient. Advised that per benefit quote received, she will be responsible for $30 copay when she comes in for [facility] and [facility]. Patient agreeable. Scheduled procedures. Advised patient of 72 hour cancellation policy and $825 cancellation fee. Patient agreeable.

## 2013-12-04 NOTE — Telephone Encounter (Signed)
Patient feels she will be starting her cycle on 7/14-15 and would like to r/s Sonohysterogram.  Discussed that since patient not currently on reliable birth control, will need to schedule for right after cycle.  Patient is scheduled for 12/19/13 at 1330 with Dr. Charlies Constable for [facility].  She verbalizes understanding for instructions. Pre-procedure instructions given. Motrin instructions given. Motrin=Advil=Ibuprofen Can take 800 mg (Can purchase over the counter, you will need four 200 mg pills). Take with food. Make sure to eat a meal and drink fluids prior to appointment.   She will call back if does not start cycle as planned for r/s if needed.  Routing to provider for final review. Patient agreeable to disposition. Will close encounter

## 2013-12-04 NOTE — Telephone Encounter (Signed)
Patient want to reschedule her SHGM/endo biopsy from 12/12/13 to another date the next week due to her menstrual cycle is expected next week.

## 2013-12-11 ENCOUNTER — Telehealth: Payer: Self-pay | Admitting: Gynecology

## 2013-12-11 NOTE — Telephone Encounter (Signed)
Patient would like to reschedule her endo bx appointment to tomorrow if possible.

## 2013-12-11 NOTE — Telephone Encounter (Signed)
Spoke with patient. Rescheduled to 07.14.2015 @ 1400

## 2013-12-12 ENCOUNTER — Other Ambulatory Visit: Payer: Self-pay | Admitting: Gynecology

## 2013-12-12 ENCOUNTER — Other Ambulatory Visit: Payer: BC Managed Care – PPO

## 2013-12-12 ENCOUNTER — Ambulatory Visit (INDEPENDENT_AMBULATORY_CARE_PROVIDER_SITE_OTHER): Payer: BC Managed Care – PPO

## 2013-12-12 ENCOUNTER — Other Ambulatory Visit: Payer: BC Managed Care – PPO | Admitting: Gynecology

## 2013-12-12 ENCOUNTER — Ambulatory Visit (INDEPENDENT_AMBULATORY_CARE_PROVIDER_SITE_OTHER): Payer: BC Managed Care – PPO | Admitting: Gynecology

## 2013-12-12 VITALS — BP 100/64 | Resp 18 | Ht 67.25 in | Wt 143.0 lb

## 2013-12-12 DIAGNOSIS — D259 Leiomyoma of uterus, unspecified: Secondary | ICD-10-CM

## 2013-12-12 DIAGNOSIS — N924 Excessive bleeding in the premenopausal period: Secondary | ICD-10-CM

## 2013-12-12 MED ORDER — NORETHIN-ETH ESTRAD-FE BIPHAS 1 MG-10 MCG / 10 MCG PO TABS: 1.0000 | ORAL_TABLET | Freq: Every day | ORAL | Status: DC

## 2013-12-12 NOTE — Progress Notes (Signed)
     Right ovary measures 3.3 x 2.0 x 1.1 cm.  Pt here for PUS for irregular menses. Pt reports cycles every 2w, no period of prolonged amenorrhea. Images reviewed with pt. Uterus is enlarged and markedly retroflexed with fundal 2.8cm fibroid.  EMS 14mm, questionable irregularity of lining noted. Right ovary normal, left with 8.6PY follicle, no free fluid. Based on endometrial irregularity, recommend SHG.  Procedure reviewed, risks accepted and consent obtained. Cervix noted to be markedly anterior under pubic symphysis, cleansed with betadine, insemination catheter placed and walls distended with NS.  Fundal lesion with feeder vessel noted, 1.5x0.4cm.     Recommend D&C hysteroscopy. Procedure outlined to pt.  Risks of bleeding, infection, uterine perforation discussed.  Pt aware that perforation may require laparoscopy to assess and treat.   Distending media NS monitoring intra-op, risks of DVT/PE reviewed.  Post-operative course as well as signs to be wary of reviewed. Questions addressed, pt agreeable to surgery. We will assess menopausal status-FSH today, if not elevated, will need to consider timing of OR and reconsider either ocp or refitting diaphragm- pt agreeable.  BP 100/64  Resp 18  Ht 5' 7.25" (1.708 m)  Wt 143 lb (64.864 kg)  BMI 22.23 kg/m2  LMP 12/08/2013 General appearance: alert, cooperative and appears stated age Lungs: clear to auscultation bilaterally Heart: regular rate and rhythm, S1, S2 normal, no murmur, click, rub or gallop Abdomen: soft, non-tender; bowel sounds normal; no masses,  no organomegaly Pelvic: cervix normal in appearance, external genitalia normal, no adnexal masses or tenderness, no cervical motion tenderness, uterus normal size, shape, and consistency, vagina normal without discharge and retroflexed Skin: Skin color, texture, turgor normal. No rashes or lesions Lymph nodes: Inguinal adenopathy: negative  84m spent counseling >50% face to  face

## 2013-12-12 NOTE — Patient Instructions (Signed)
Start lo loestrin only if fsh is in normal range, will call with results Start first day of flow

## 2013-12-13 ENCOUNTER — Encounter: Payer: Self-pay | Admitting: Gynecology

## 2013-12-13 ENCOUNTER — Encounter (HOSPITAL_COMMUNITY): Payer: Self-pay | Admitting: *Deleted

## 2013-12-13 ENCOUNTER — Telehealth: Payer: Self-pay | Admitting: Gynecology

## 2013-12-13 LAB — FOLLICLE STIMULATING HORMONE: FSH: 12.5 m[IU]/mL

## 2013-12-13 NOTE — Progress Notes (Signed)
Pt unable to make pre-op visit.Annette Gentry ekg from 2012 given to dr. Glennon Mac. No new orders given. Pt ok to be lsd

## 2013-12-13 NOTE — Telephone Encounter (Signed)
Pt informed [place] is normal, not menopausal, will need to schedule surgery in first half of cycle, will try for 7/21, pt agreeable. Will discuss contraception later

## 2013-12-14 ENCOUNTER — Telehealth: Payer: Self-pay | Admitting: Gynecology

## 2013-12-14 NOTE — Telephone Encounter (Signed)
Left message for patient to call back. Need to go over surgery benefits. °

## 2013-12-14 NOTE — Telephone Encounter (Signed)
Patient returning Sabrina's call. °

## 2013-12-14 NOTE — Telephone Encounter (Signed)
Patient is returning a call, not message left. Patient is wondering if her number was dialed by mistake. Patient says to call if you need her.

## 2013-12-14 NOTE — Telephone Encounter (Signed)
Spoke with patient. Collected payment of [phone] for surgery scheduled 07.21.2015. Mailed copy of receipt to patient.[name]

## 2013-12-14 NOTE — Telephone Encounter (Signed)
Spoke with pt and notified surgery is scheduled for 12-19-13 at 8:30am.  No pre-op appt needed per Dr. Charlies Constable.  Reviewed Surgery Information Form/instructions with patient and she voiced understanding.  Post-op appt made for 01-10-14 11:00am.

## 2013-12-15 ENCOUNTER — Encounter (HOSPITAL_COMMUNITY): Payer: Self-pay | Admitting: Pharmacist

## 2013-12-18 ENCOUNTER — Other Ambulatory Visit: Payer: Self-pay | Admitting: Gynecology

## 2013-12-19 ENCOUNTER — Other Ambulatory Visit: Payer: BC Managed Care – PPO | Admitting: Gynecology

## 2013-12-19 ENCOUNTER — Ambulatory Visit (HOSPITAL_COMMUNITY)
Admission: RE | Admit: 2013-12-19 | Discharge: 2013-12-19 | Disposition: A | Discharge status: Home / Self Care | Payer: BC Managed Care – PPO | Source: Ambulatory Visit | Attending: Gynecology | Admitting: Gynecology

## 2013-12-19 ENCOUNTER — Other Ambulatory Visit: Payer: BC Managed Care – PPO

## 2013-12-19 ENCOUNTER — Encounter (HOSPITAL_COMMUNITY): Payer: Self-pay

## 2013-12-19 ENCOUNTER — Encounter (HOSPITAL_COMMUNITY)
Admission: RE | Disposition: A | Discharge status: Home / Self Care | Payer: Self-pay | Source: Ambulatory Visit | Attending: Gynecology

## 2013-12-19 ENCOUNTER — Ambulatory Visit (HOSPITAL_COMMUNITY): Payer: BC Managed Care – PPO | Admitting: Anesthesiology

## 2013-12-19 ENCOUNTER — Encounter (HOSPITAL_COMMUNITY): Payer: BC Managed Care – PPO | Admitting: Anesthesiology

## 2013-12-19 DIAGNOSIS — D259 Leiomyoma of uterus, unspecified: Secondary | ICD-10-CM | POA: Insufficient documentation

## 2013-12-19 DIAGNOSIS — N924 Excessive bleeding in the premenopausal period: Secondary | ICD-10-CM

## 2013-12-19 DIAGNOSIS — N926 Irregular menstruation, unspecified: Secondary | ICD-10-CM | POA: Insufficient documentation

## 2013-12-19 DIAGNOSIS — N921 Excessive and frequent menstruation with irregular cycle: Secondary | ICD-10-CM | POA: Insufficient documentation

## 2013-12-19 DIAGNOSIS — N840 Polyp of corpus uteri: Secondary | ICD-10-CM | POA: Insufficient documentation

## 2013-12-19 DIAGNOSIS — N854 Malposition of uterus: Secondary | ICD-10-CM | POA: Insufficient documentation

## 2013-12-19 DIAGNOSIS — Z9889 Other specified postprocedural states: Secondary | ICD-10-CM

## 2013-12-19 DIAGNOSIS — N920 Excessive and frequent menstruation with regular cycle: Secondary | ICD-10-CM | POA: Insufficient documentation

## 2013-12-19 HISTORY — DX: Other specified health status: Z78.9

## 2013-12-19 HISTORY — PX: DILATATION & CURETTAGE/HYSTEROSCOPY WITH TRUECLEAR: SHX6353

## 2013-12-19 LAB — CBC
HCT: 35.8 % — ABNORMAL LOW (ref 36.0–46.0)
Hemoglobin: 11.8 g/dL — ABNORMAL LOW (ref 12.0–15.0)
MCH: 29.9 pg (ref 26.0–34.0)
MCHC: 33 g/dL (ref 30.0–36.0)
MCV: 90.9 fL (ref 78.0–100.0)
PLATELETS: 188 10*3/uL (ref 150–400)
RBC: 3.94 MIL/uL (ref 3.87–5.11)
RDW: 17.3 % — ABNORMAL HIGH (ref 11.5–15.5)
WBC: 5.4 10*3/uL (ref 4.0–10.5)

## 2013-12-19 LAB — PREGNANCY, URINE: Preg Test, Ur: NEGATIVE

## 2013-12-19 SURGERY — DILATATION & CURETTAGE/HYSTEROSCOPY WITH TRUCLEAR
Anesthesia: General | Site: Vagina

## 2013-12-19 MED ORDER — LIDOCAINE HCL (CARDIAC) 20 MG/ML IV SOLN
INTRAVENOUS | Status: DC | PRN
  Administered 2013-12-19: 50 mg via INTRAVENOUS

## 2013-12-19 MED ORDER — ONDANSETRON HCL 4 MG/2ML IJ SOLN
INTRAMUSCULAR | Status: AC
  Filled 2013-12-19: qty 2

## 2013-12-19 MED ORDER — GLYCOPYRROLATE 0.2 MG/ML IJ SOLN
INTRAMUSCULAR | Status: AC
  Filled 2013-12-19: qty 1

## 2013-12-19 MED ORDER — ONDANSETRON HCL 4 MG/2ML IJ SOLN
INTRAMUSCULAR | Status: DC | PRN
  Administered 2013-12-19: 4 mg via INTRAVENOUS

## 2013-12-19 MED ORDER — PROPOFOL 10 MG/ML IV BOLUS
INTRAVENOUS | Status: DC | PRN
  Administered 2013-12-19: 160 mg via INTRAVENOUS

## 2013-12-19 MED ORDER — LIDOCAINE HCL 2 % IJ SOLN
INTRAMUSCULAR | Status: AC
  Filled 2013-12-19: qty 20

## 2013-12-19 MED ORDER — FENTANYL CITRATE 0.05 MG/ML IJ SOLN
INTRAMUSCULAR | Status: AC
  Filled 2013-12-19: qty 2

## 2013-12-19 MED ORDER — BUPIVACAINE-EPINEPHRINE 0.25% -1:200000 IJ SOLN
INTRAMUSCULAR | Status: DC | PRN
Start: 2013-12-19 — End: 2013-12-19
  Administered 2013-12-19: 10 mL

## 2013-12-19 MED ORDER — LIDOCAINE HCL 2 % IJ SOLN
INTRAMUSCULAR | Status: DC | PRN
  Administered 2013-12-19: 10 mL

## 2013-12-19 MED ORDER — MIDAZOLAM HCL 2 MG/2ML IJ SOLN
INTRAMUSCULAR | Status: AC
  Filled 2013-12-19: qty 2

## 2013-12-19 MED ORDER — MIDAZOLAM HCL 2 MG/2ML IJ SOLN
INTRAMUSCULAR | Status: DC | PRN
  Administered 2013-12-19: 2 mg via INTRAVENOUS

## 2013-12-19 MED ORDER — FENTANYL CITRATE 0.05 MG/ML IJ SOLN: 25.0000 ug | INTRAMUSCULAR | Status: DC | PRN

## 2013-12-19 MED ORDER — KETOROLAC TROMETHAMINE 30 MG/ML IJ SOLN
INTRAMUSCULAR | Status: AC
  Filled 2013-12-19: qty 1

## 2013-12-19 MED ORDER — LACTATED RINGERS IV SOLN
INTRAVENOUS | Status: DC
  Administered 2013-12-19 (×2): via INTRAVENOUS

## 2013-12-19 MED ORDER — FENTANYL CITRATE 0.05 MG/ML IJ SOLN
INTRAMUSCULAR | Status: DC | PRN
  Administered 2013-12-19: 25 ug via INTRAVENOUS
  Administered 2013-12-19: 50 ug via INTRAVENOUS
  Administered 2013-12-19: 25 ug via INTRAVENOUS

## 2013-12-19 MED ORDER — LACTATED RINGERS IV SOLN: INTRAVENOUS | Status: DC

## 2013-12-19 MED ORDER — SODIUM CHLORIDE 0.9 % IR SOLN
Status: DC | PRN
  Administered 2013-12-19: 3000 mL

## 2013-12-19 MED ORDER — DEXAMETHASONE SODIUM PHOSPHATE 10 MG/ML IJ SOLN
INTRAMUSCULAR | Status: AC
  Filled 2013-12-19: qty 1

## 2013-12-19 MED ORDER — BUPIVACAINE-EPINEPHRINE (PF) 0.25% -1:200000 IJ SOLN
INTRAMUSCULAR | Status: AC
  Filled 2013-12-19: qty 30

## 2013-12-19 MED ORDER — LIDOCAINE HCL (CARDIAC) 20 MG/ML IV SOLN
INTRAVENOUS | Status: AC
  Filled 2013-12-19: qty 5

## 2013-12-19 MED ORDER — KETOROLAC TROMETHAMINE 30 MG/ML IJ SOLN
INTRAMUSCULAR | Status: DC | PRN
  Administered 2013-12-19: 30 mg via INTRAVENOUS

## 2013-12-19 SURGICAL SUPPLY — 16 items
BLADE INCISOR TRUC PLUS 2.9 (ABLATOR) IMPLANT
CANISTERS HI-FLOW 3000CC (CANNISTER) ×6 IMPLANT
CATH ROBINSON RED A/P 16FR (CATHETERS) ×3 IMPLANT
CONTAINER PREFILL 10% NBF 60ML (FORM) ×6 IMPLANT
DRAPE HYSTEROSCOPY (DRAPE) IMPLANT
GLOVE BIOGEL M 6.5 STRL (GLOVE) ×3 IMPLANT
GLOVE BIOGEL PI IND STRL 6.5 (GLOVE) ×1 IMPLANT
GLOVE BIOGEL PI INDICATOR 6.5 (GLOVE) ×2
GOWN STRL REUS W/TWL LRG LVL3 (GOWN DISPOSABLE) ×6 IMPLANT
INCISOR TRUC PLUS BLADE 2.9 (ABLATOR)
KIT HYSTEROSCOPY TRUCLEAR (ABLATOR) ×3 IMPLANT
MORCELLATOR RECIP TRUCLEAR 4.0 (ABLATOR) IMPLANT
PACK VAGINAL MINOR WOMEN LF (CUSTOM PROCEDURE TRAY) ×3 IMPLANT
PAD OB MATERNITY 4.3X12.25 (PERSONAL CARE ITEMS) ×3 IMPLANT
TOWEL OR 17X24 6PK STRL BLUE (TOWEL DISPOSABLE) ×6 IMPLANT
WATER STERILE IRR 1000ML POUR (IV SOLUTION) ×3 IMPLANT

## 2013-12-19 NOTE — Brief Op Note (Signed)
12/19/2013  03:70 AM  PATIENT:  Annette Gentry  58 y.o. female  PRE-OPERATIVE DIAGNOSIS:  endometrial polyp  POST-OPERATIVE DIAGNOSIS:  endometrial polyp  PROCEDURE:  Procedure(s): DILATATION & CURETTAGE/HYSTEROSCOPY WITH TRUCLEAR (N/A)  SURGEON:  Surgeon(s) and Role:    * Azalia Bilis, MD - Primary  PHYSICIAN ASSISTANT:   ASSISTANTS: none   ANESTHESIA:   MAC  EBL:  Total I/O In: 1000 [I.V.:1000] Out: 110 [Urine:100; Blood:10]  BLOOD ADMINISTERED:none  DRAINS: none   LOCAL MEDICATIONS USED:  0.25%MARCAINE   , 2%LIDOCAINE  and Amount: 20 ml total equal parts  SPECIMEN:  Source of Specimen:  uterus  DISPOSITION OF SPECIMEN:  PATHOLOGY  COUNTS:  YES  TOURNIQUET:  * No tourniquets in log *  DICTATION: .Other Dictation: Dictation Number [id number]  PLAN OF CARE: Discharge to home after PACU  PATIENT DISPOSITION:  PACU - hemodynamically stable.   Delay start of Pharmacological VTE agent (>24hrs) due to surgical blood loss or risk of bleeding: not applicable

## 2013-12-19 NOTE — Transfer of Care (Signed)
Immediate Anesthesia Transfer of Care Note  Patient: Annette Gentry  Procedure(s) Performed: Procedure(s): DILATATION & CURETTAGE/HYSTEROSCOPY WITH TRUCLEAR (N/A)  Patient Location: PACU  Anesthesia Type:General  Level of Consciousness: awake, alert  and oriented  Airway & Oxygen Therapy: Patient Spontanous Breathing and Patient connected to nasal cannula oxygen  Post-op Assessment: Report given to PACU RN, Post -op Vital signs reviewed and stable and Patient moving all extremities X 4  Post vital signs: Reviewed and stable  Complications: No apparent anesthesia complications

## 2013-12-19 NOTE — H&P (View-Only) (Signed)
     Right ovary measures 3.3 x 2.0 x 1.1 cm.  Pt here for PUS for irregular menses. Pt reports cycles every 2w, no period of prolonged amenorrhea. Images reviewed with pt. Uterus is enlarged and markedly retroflexed with fundal 2.8cm fibroid.  EMS 35mm, questionable irregularity of lining noted. Right ovary normal, left with 6.2BJ follicle, no free fluid. Based on endometrial irregularity, recommend SHG.  Procedure reviewed, risks accepted and consent obtained. Cervix noted to be markedly anterior under pubic symphysis, cleansed with betadine, insemination catheter placed and walls distended with NS.  Fundal lesion with feeder vessel noted, 1.5x0.4cm.     Recommend D&C hysteroscopy. Procedure outlined to pt.  Risks of bleeding, infection, uterine perforation discussed.  Pt aware that perforation may require laparoscopy to assess and treat.   Distending media NS monitoring intra-op, risks of DVT/PE reviewed.  Post-operative course as well as signs to be wary of reviewed. Questions addressed, pt agreeable to surgery. We will assess menopausal status-FSH today, if not elevated, will need to consider timing of OR and reconsider either ocp or refitting diaphragm- pt agreeable.  BP 100/64  Resp 18  Ht 5' 7.25" (1.708 m)  Wt 143 lb (64.864 kg)  BMI 22.23 kg/m2  LMP 12/08/2013 General appearance: alert, cooperative and appears stated age Lungs: clear to auscultation bilaterally Heart: regular rate and rhythm, S1, S2 normal, no murmur, click, rub or gallop Abdomen: soft, non-tender; bowel sounds normal; no masses,  no organomegaly Pelvic: cervix normal in appearance, external genitalia normal, no adnexal masses or tenderness, no cervical motion tenderness, uterus normal size, shape, and consistency, vagina normal without discharge and retroflexed Skin: Skin color, texture, turgor normal. No rashes or lesions Lymph nodes: Inguinal adenopathy: negative  88m spent counseling >50% face to  face

## 2013-12-19 NOTE — Op Note (Signed)
NAMEDARNETTE, LAMPRON                ACCOUNT NO.:  [id number]  MEDICAL RECORD NO.:  [id number]  LOCATION:  [facility]                          FACILITY:  [facility]  PHYSICIAN:  Alver Sorrow. Charlies Constable, M.D. DATE OF BIRTH:  Sep 17, 1955  DATE OF PROCEDURE:  12/19/2013 DATE OF DISCHARGE:                              OPERATIVE REPORT   PREOPERATIVE DIAGNOSIS:  Metrorrhagia perimenopausal known fibroid uterus and endometrial polyp.  POSTOPERATIVE DIAGNOSIS:  Metrorrhagia perimenopausal, known fibroid uterus and endometrial polyp.  PROCEDURE:  D and C, hysteroscopy with TRUCLEAR.  SURGEON:  Alver Sorrow. Charlies Constable, M.D.  ANESTHESIA:  General.  IV FLUID:  A 1000 mL lactated Ringer's.  URINE OUTPUT:  100 mL of urine.  I and O deficit of normal saline distending fluid was 20.  INDICATIONS:  The patient is a 58 year old perimenopausal female, who was having menometrorrhagia.  On sonohysterogram, she was noted to have an endometrial defect what looked like a polyp with blood flow that measured 1.5 x 0.5 cm.  Remainder of the cavity was remarkable for fundal fibroid and severe retroflexion.  FINDINGS:  The uterus was retroflexed and sounded to 9.  There were 2 intracavitary defects consistent with polyps.  Otherwise, the cavity was normal.  The tubal ostia were visualized and normal.  PATHOLOGY:  Uterine contents.  PROCEDURE IN DETAIL:  The patient was taken to the operating room. General anesthesia was induced.  Placed in dorsal lithotomy position. Prepped and draped in usual sterile fashion.  Bimanual exam was performed.  Orientation of the uterus was confirmed. Bivalve speculum was placed in the vagina.  The cervix was visualized and stabilized with a single-tooth tenaculum.  The paracervical block was placed circumferentially with equal parts of 2% lidocaine and 0.25% Marcaine.  The cervix was noted to be dilated.  The sound passed without resistance and the uterus sounded to 9.  The #21-French dilator  advanced without any resistance, followed by the 23.  The hysteroscope was then set up, and a hysteroscopy was then performed.  Upon entering the cavity, a fundal polyp was noted, as we had appreciated the ostia were visualized.  On slow retraction through the cavity, however, 2nd polyp was noted arising from the left side wall.  The remainder of the cavity appeared smooth.  The hysteroscope was removed.  The TRUCLEAR polyp blade was then set up for the removal of the polyps.  The blade was set. The hysteroscope was readvanced into the cavity and the 2 polyps were then removed under direct visualization.  The bases of each were noted to be clear.  There was noted to be no bleeding.  The hysteroscope was then removed.  Sharp curettage was performed for minimal tissue.  The hysteroscope was readvanced and the findings were then unremarkable.  The remainder of the cervix was visualized on slow retreat and was also unremarkable.  The patient tolerated the procedure well.  Sponge, lap, and needle counts correct x2.  She was extubated in the OR, transferred to the recovery room in stable condition.     Alver Sorrow. Charlies Constable, M.D.     THL/MEDQ  D:  12/19/2013  T:  12/19/2013  Job:  [id number]

## 2013-12-19 NOTE — Anesthesia Preprocedure Evaluation (Signed)
Anesthesia Evaluation  Patient identified by MRN, date of birth, ID band Patient awake    Reviewed: Allergy & Precautions, H&P , Patient's Chart, lab work & pertinent test results, reviewed documented beta blocker date and time   Airway Mallampati: II TM Distance: >3 FB Neck ROM: full    Dental no notable dental hx.    Pulmonary former smoker,  breath sounds clear to auscultation  Pulmonary exam normal       Cardiovascular Rhythm:regular Rate:Normal     Neuro/Psych    GI/Hepatic   Endo/Other    Renal/GU      Musculoskeletal   Abdominal   Peds  Hematology   Anesthesia Other Findings    CARDIAC STUDIES DONE 2009 - WAS TOLD THEY WERE NORMAL  Arthritis        Sjogren's syndrome  Little affected    Osteopenia           Reproductive/Obstetrics                           Anesthesia Physical Anesthesia Plan  ASA: II  Anesthesia Plan:    Post-op Pain Management:    Induction: Intravenous  Airway Management Planned: LMA  Additional Equipment:   Intra-op Plan:   Post-operative Plan:   Informed Consent: I have reviewed the patients History and Physical, chart, labs and discussed the procedure including the risks, benefits and alternatives for the proposed anesthesia with the patient or authorized representative who has indicated his/her understanding and acceptance.   Dental Advisory Given and Dental advisory given  Plan Discussed with: CRNA and Surgeon  Anesthesia Plan Comments: (Discussed GA with LMA, possible sore throat, potential need to switch to ETT, N/V, pulmonary aspiration. Questions answered. )        Anesthesia Quick Evaluation

## 2013-12-19 NOTE — Discharge Instructions (Signed)

## 2013-12-19 NOTE — Interval H&P Note (Signed)
History and Physical Interval Note:  6/57/8469 6:29 AM  Annette Gentry  has presented today for surgery, with the diagnosis of endometrial polyp  The various methods of treatment have been discussed with the patient and family. After consideration of risks, benefits and other options for treatment, the patient has consented to  Procedure(s): [place] (N/A) as a surgical intervention .  The patient's history has been reviewed, patient examined, no change in status, stable for surgery.  I have reviewed the patient's chart and labs.  Questions were answered to the patient's satisfaction.     Braedon Sjogren H

## 2013-12-20 ENCOUNTER — Encounter (HOSPITAL_COMMUNITY): Payer: Self-pay | Admitting: Gynecology

## 2013-12-20 NOTE — Anesthesia Postprocedure Evaluation (Signed)
  Anesthesia Post-op Note  Patient: Annette Gentry  Procedure(s) Performed: Procedure(s): DILATATION & CURETTAGE/HYSTEROSCOPY WITH TRUCLEAR (N/A) Patient is awake and responsive. Pain and nausea are reasonably well controlled. Vital signs are stable and clinically acceptable. Oxygen saturation is clinically acceptable. There are no apparent anesthetic complications at this time. Patient is ready for discharge.

## 2013-12-21 ENCOUNTER — Telehealth: Payer: Self-pay

## 2013-12-21 NOTE — Telephone Encounter (Signed)
Message copied by Jasmine Awe on Thu Dec 21, 2013  9:06 AM ------      Message from: Elveria Rising      Created: Wed Dec 20, 2013  4:22 PM       Inform benign polyps ------

## 2013-12-21 NOTE — Telephone Encounter (Signed)
Spoke with patient. Advised of results as seen below from Dr.Lathrop. Patient agreeable and verbalizes understanding.  Routing to provider for final review. Patient agreeable to disposition. Will close encounter

## 2013-12-21 NOTE — Telephone Encounter (Signed)
Left message to call Keylee Shrestha at [phone]. 

## 2013-12-26 ENCOUNTER — Ambulatory Visit (INDEPENDENT_AMBULATORY_CARE_PROVIDER_SITE_OTHER): Payer: BC Managed Care – PPO | Admitting: Gynecology

## 2013-12-26 ENCOUNTER — Encounter: Payer: Self-pay | Admitting: Gynecology

## 2013-12-26 VITALS — BP 102/60 | HR 60 | Ht 67.25 in | Wt 142.0 lb

## 2013-12-26 DIAGNOSIS — Z9889 Other specified postprocedural states: Secondary | ICD-10-CM

## 2013-12-26 DIAGNOSIS — Z3009 Encounter for other general counseling and advice on contraception: Secondary | ICD-10-CM

## 2013-12-26 DIAGNOSIS — N924 Excessive bleeding in the premenopausal period: Secondary | ICD-10-CM

## 2013-12-26 NOTE — Progress Notes (Signed)
Subjective:     Patient ID: Annette Gentry, female   DOB: 10-01-1955, 58 y.o.   MRN: [id number]  HPI Comments: Pt here 1w after a hysteroscopic polypectomy.  Overall is feeling well, minimal bleeding afterwards and no pain.  Pt is ware that she needs contraception based on [facility] 12.      Review of Systems Per HPI     Objective:   Physical Exam  Nursing note and vitals reviewed. Constitutional: She is oriented to person, place, and time. She appears well-developed and well-nourished.  Genitourinary: Vagina normal. There is no lesion on the right labia. There is no lesion on the left labia. Uterus is deviated (retroflexed). Uterus is not enlarged and not tender. Cervix exhibits no motion tenderness. Right adnexum displays no fullness. Left adnexum displays no fullness.  Neurological: She is alert and oriented to person, place, and time.       Assessment:     Post-op Need for contraception menorrhagia     Plan:     Doing well post op, images reviewed as well as pathology, may resume normal actvities We reviewed need for contraception, she has been using an old diaphragm, we could refit her, start ocp or consider IUD placement.  Her metrorrhagia may improve after her polypectomy.  Pt is most interested in IUD placement.  Risks of bleeding, infection and perforation  Were reviewed. She will rto with her upcoming menses Questions addressed Additional 74m spent counseling on contraceptive options, >50% face to face

## 2013-12-26 NOTE — Patient Instructions (Signed)
Levonorgestrel intrauterine device (IUD) What is this medicine? LEVONORGESTREL IUD (LEE voe nor jes trel) is a contraceptive (birth control) device. The device is placed inside the uterus by a healthcare professional. It is used to prevent pregnancy and can also be used to treat heavy bleeding that occurs during your period. Depending on the device, it can be used for 3 to 5 years. This medicine may be used for other purposes; ask your health care provider or pharmacist if you have questions. COMMON BRAND NAME(S): LILETTA, Mirena, Skyla What should I tell my health care provider before I take this medicine? They need to know if you have any of these conditions: -abnormal Pap smear -cancer of the breast, uterus, or cervix -diabetes -endometritis -genital or pelvic infection now or in the past -have more than one sexual partner or your partner has more than one partner -heart disease -history of an ectopic or tubal pregnancy -immune system problems -IUD in place -liver disease or tumor -problems with blood clots or take blood-thinners -use intravenous drugs -uterus of unusual shape -vaginal bleeding that has not been explained -an unusual or allergic reaction to levonorgestrel, other hormones, silicone, or polyethylene, medicines, foods, dyes, or preservatives -pregnant or trying to get pregnant -breast-feeding How should I use this medicine? This device is placed inside the uterus by a health care professional. Talk to your pediatrician regarding the use of this medicine in children. Special care may be needed. Overdosage: If you think you have taken too much of this medicine contact a poison control center or emergency room at once. NOTE: This medicine is only for you. Do not share this medicine with others. What if I miss a dose? This does not apply. What may interact with this medicine? Do not take this medicine with any of the following  medications: -amprenavir -bosentan -fosamprenavir This medicine may also interact with the following medications: -aprepitant -barbiturate medicines for inducing sleep or treating seizures -bexarotene -griseofulvin -medicines to treat seizures like carbamazepine, ethotoin, felbamate, oxcarbazepine, phenytoin, topiramate -modafinil -pioglitazone -rifabutin -rifampin -rifapentine -some medicines to treat HIV infection like atazanavir, indinavir, lopinavir, nelfinavir, tipranavir, ritonavir -St. John's wort -warfarin This list may not describe all possible interactions. Give your health care provider a list of all the medicines, herbs, non-prescription drugs, or dietary supplements you use. Also tell them if you smoke, drink alcohol, or use illegal drugs. Some items may interact with your medicine. What should I watch for while using this medicine? Visit your doctor or health care professional for regular check ups. See your doctor if you or your partner has sexual contact with others, becomes HIV positive, or gets a sexual transmitted disease. This product does not protect you against HIV infection (AIDS) or other sexually transmitted diseases. You can check the placement of the IUD yourself by reaching up to the top of your vagina with clean fingers to feel the threads. Do not pull on the threads. It is a good habit to check placement after each menstrual period. Call your doctor right away if you feel more of the IUD than just the threads or if you cannot feel the threads at all. The IUD may come out by itself. You may become pregnant if the device comes out. If you notice that the IUD has come out use a backup birth control method like condoms and call your health care provider. Using tampons will not change the position of the IUD and are okay to use during your period. What side effects may   I notice from receiving this medicine? Side effects that you should report to your doctor or  health care professional as soon as possible: -allergic reactions like skin rash, itching or hives, swelling of the face, lips, or tongue -fever, flu-like symptoms -genital sores -high blood pressure -no menstrual period for 6 weeks during use -pain, swelling, warmth in the leg -pelvic pain or tenderness -severe or sudden headache -signs of pregnancy -stomach cramping -sudden shortness of breath -trouble with balance, talking, or walking -unusual vaginal bleeding, discharge -yellowing of the eyes or skin Side effects that usually do not require medical attention (report to your doctor or health care professional if they continue or are bothersome): -acne -breast pain -change in sex drive or performance -changes in weight -cramping, dizziness, or faintness while the device is being inserted -headache -irregular menstrual bleeding within first 3 to 6 months of use -nausea This list may not describe all possible side effects. Call your doctor for medical advice about side effects. You may report side effects to FDA at 1-800-FDA-1088. Where should I keep my medicine? This does not apply. NOTE: This sheet is a summary. It may not cover all possible information. If you have questions about this medicine, talk to your doctor, pharmacist, or health care provider.  2015, Elsevier/Gold Standard. (2011-06-18 13:54:04)  

## 2013-12-29 ENCOUNTER — Telehealth: Payer: Self-pay | Admitting: Gynecology

## 2013-12-29 ENCOUNTER — Ambulatory Visit (INDEPENDENT_AMBULATORY_CARE_PROVIDER_SITE_OTHER): Payer: BC Managed Care – PPO | Admitting: Gynecology

## 2013-12-29 ENCOUNTER — Encounter: Payer: Self-pay | Admitting: Gynecology

## 2013-12-29 VITALS — BP 90/56 | HR 76 | Resp 18 | Ht 67.25 in | Wt 143.0 lb

## 2013-12-29 DIAGNOSIS — Z30430 Encounter for insertion of intrauterine contraceptive device: Secondary | ICD-10-CM

## 2013-12-29 DIAGNOSIS — Z3009 Encounter for other general counseling and advice on contraception: Secondary | ICD-10-CM

## 2013-12-29 DIAGNOSIS — N924 Excessive bleeding in the premenopausal period: Secondary | ICD-10-CM

## 2013-12-29 DIAGNOSIS — Z30014 Encounter for initial prescription of intrauterine contraceptive device: Secondary | ICD-10-CM

## 2013-12-29 NOTE — Telephone Encounter (Signed)
Patient was told to call when her cycle starts to have IUD inserted.

## 2013-12-29 NOTE — Telephone Encounter (Signed)
Spoke with patient. Appointment scheduled for today at 2:30pm with Dr.Lathrop (time per provider). Patient agreeable to date and time. Pre procedure instructions given.  Motrin instructions given. Motrin=Advil=Ibuprofen, 800 mg one hour before appointment. Patient agreeable.  Routing to provider for final review. Patient agreeable to disposition. Will close encounter

## 2013-12-29 NOTE — Telephone Encounter (Signed)
Spoke with patient. Patient states that she started her cycle yesterday and is going out of town tomorrow. Patient states that her cycles are irregular and could come in today if Dr.Lathrop has anything available. Advised Dr.Lathrop's schedule is booked for today but I would speak with her to see what she recommends for appointment time. Advised patient may have to wait until next cycle to have insertion. Patient is agreeable "That will be no problem."

## 2013-12-29 NOTE — Progress Notes (Signed)
66 yrs Married Caucasian female presents for  insertion of Mirena. Denies any vaginal symptoms or STD concerns.  LMP: 12/29/13  Patient read information regarding IUD insertion.  All questions addressed.    Healthy female,time, place and personnormal menses, no abnormal bleeding, pelvic pain or discharge, no breast pain or new or enlarging lumps on self exam Abdomen: soft, non-tender [place] inguinal nodes palpated  Pelvic exam: Vulva;normal  Vagina:normal vagina  Cervix:Non-tender, Negative CMT, no lesions or redness, nulliparous/parous os  Uterus:retroverted, retroflexed, mobile     Procedure:  Speculum inserted into vagina. Cervix visualized and cleansed with betadine solution X 3. Tenaculum placed on cervix at 6 o'clock position(s).  Uterus sounded to 8 centimeters.  IUD removed from sterile packet and under sterile conditions inserted to fundus of uterus.  Introducer removed without difficulty.  IUD string trimmed to 3 centimeters.  Remainder string given to patient to feel for identification.  Tenaculum removed.  No bleeding noted.  Speculum removed.  Uterus palpated normal.  Patient tolerated procedure well.  A: Insertion of Mirena, Lot # [id number], Expiration date 8/17   P:  Instructions and warnings signs given.       IUD identification card given with IUD removal 11/2018       Return visit 70M

## 2014-01-03 ENCOUNTER — Ambulatory Visit: Payer: BC Managed Care – PPO | Admitting: Gynecology

## 2014-01-10 ENCOUNTER — Ambulatory Visit: Payer: BC Managed Care – PPO | Admitting: Gynecology

## 2014-01-29 ENCOUNTER — Ambulatory Visit (INDEPENDENT_AMBULATORY_CARE_PROVIDER_SITE_OTHER): Payer: BC Managed Care – PPO | Admitting: Gynecology

## 2014-01-29 ENCOUNTER — Encounter: Payer: Self-pay | Admitting: Gynecology

## 2014-01-29 VITALS — BP 112/66 | HR 80 | Resp 16 | Ht 67.25 in | Wt 144.0 lb

## 2014-01-29 DIAGNOSIS — Z30431 Encounter for routine checking of intrauterine contraceptive device: Secondary | ICD-10-CM

## 2014-01-29 DIAGNOSIS — Z975 Presence of (intrauterine) contraceptive device: Secondary | ICD-10-CM | POA: Insufficient documentation

## 2014-01-29 NOTE — Progress Notes (Signed)
Subjective:     Patient ID: Annette Gentry, female   DOB: [birth year], 58 y.o.   MRN: [id number]  HPI Comments: Pt here for f/u after placing Mirena IUD for menorhhagia and contraception.  Pt states cycle was manageable, longer? Minimal cramping.  No dyspareunia.    Review of Systems Per hpi     Objective:   Physical Exam  Nursing note and vitals reviewed. Constitutional: She is oriented to person, place, and time. She appears well-developed and well-nourished.  Genitourinary:   Pelvic: External genitalia:  no lesions              Urethra:  normal appearing urethra with no masses, tenderness or lesions              Bartholins and Skenes: normal                 Vagina: normal appearing vagina with normal color and discharge, no lesions              Cervix: normal appearance, strings noted                      Bimanual Exam:  Uterus: markedly retroflexed  nontender                                       Adnexa: normal adnexa in size, nontender and no masses                                       Neurological: She is alert and oriented to person, place, and time.       Assessment:     IUD check     Plan:     Doing well Bleeding pattern reviewed F/u prn, annual

## 2014-04-02 ENCOUNTER — Encounter: Payer: Self-pay | Admitting: Gynecology

## 2014-04-09 ENCOUNTER — Other Ambulatory Visit: Payer: Self-pay | Admitting: Family Medicine

## 2014-04-09 DIAGNOSIS — R1900 Intra-abdominal and pelvic swelling, mass and lump, unspecified site: Secondary | ICD-10-CM

## 2014-04-17 ENCOUNTER — Other Ambulatory Visit: Payer: BC Managed Care – PPO

## 2014-07-06 ENCOUNTER — Other Ambulatory Visit: Payer: Self-pay | Admitting: *Deleted

## 2014-07-06 DIAGNOSIS — G609 Hereditary and idiopathic neuropathy, unspecified: Secondary | ICD-10-CM

## 2014-07-09 ENCOUNTER — Ambulatory Visit (INDEPENDENT_AMBULATORY_CARE_PROVIDER_SITE_OTHER): Payer: BC Managed Care – PPO | Admitting: Neurology

## 2014-07-09 DIAGNOSIS — G609 Hereditary and idiopathic neuropathy, unspecified: Secondary | ICD-10-CM

## 2014-07-09 DIAGNOSIS — G5601 Carpal tunnel syndrome, right upper limb: Secondary | ICD-10-CM

## 2014-07-09 NOTE — Procedures (Signed)
[facility]  [address]  [place], [place] [address] Tel: [phone] Fax:  [phone] Test Date:  06/04/863  Patient: Annette Gentry DOB: [birth year] Physician: Narda Amber, DO  Sex: Female Height: 5\' 7"  Ref Phys: Dr. Leigh Aurora  ID#: [id number] Temp: 36.0C Technician: Laureen Ochs   Patient Complaints: Patient is a 59 year old female here for evaluation of right arm numbness which wakes her up at night and has tried using wrist splints unsuccessfully.  NCV & EMG Findings: Extensive electrodiagnostic testing of the right upper extremity shows:  1. Right median, ulnar, and radial sensory responses are within normal limits. Right palmar studies are abnormal. 2. Right median and ulnar motor responses are within normal limits. 3. There is no evidence of active or chronic motor axon loss changes affecting any of the tested muscles. Motor unit configuration and recruitment pattern is normal.  Impression: 1. Right median neuropathy at or distal to the wrist, consistent with the clinical diagnosis of carpal tunnel syndrome. Overall, these findings are mild in degree electrically. 2. There is no evidence of a cervical radiculopathy or ulnar neuropathy affecting the right upper extremity.   ___________________________ Narda Amber, DO    Nerve Conduction Studies Anti Sensory Summary Table   Site NR Peak (ms) Norm Peak (ms) P-T Amp (V) Norm P-T Amp  Right Median Anti Sensory (2nd Digit)  36C  Wrist    3.5 <3.6 26.6 >15  Right Radial Anti Sensory (Base 1st Digit)  36C  Wrist    2.1 <2.7 25.4 >14  Right Ulnar Anti Sensory (5th Digit)  36C  Wrist    2.6 <3.1 30.2 >10   Motor Summary Table   Site NR Onset (ms) Norm Onset (ms) O-P Amp (mV) Norm O-P Amp Site1 Site2 Delta-0 (ms) Dist (cm) Vel (m/s) Norm Vel (m/s)  Right Median Motor (Abd Poll Brev)  36C  Wrist    3.8 <4.0 10.2 >6 Elbow Wrist 5.4 27.0 50 >50  Elbow    9.2  10.1         Right Ulnar Motor  (Abd Dig Minimi)  36C  Wrist    1.9 <3.1 10.3 >7 B Elbow Wrist 3.4 21.0 62 >50  B Elbow    5.3  9.5  A Elbow B Elbow 1.8 10.0 56 >50  A Elbow    7.1  9.4          Comparison Summary Table   Site NR Peak (ms) Norm Peak (ms) P-T Amp (V) Site1 Site2 Delta-P (ms) Norm Delta (ms)  Right Median/Ulnar Palm Comparison (Wrist - 8cm)  36C  Median Palm    2.3 <2.2 25.8 Median Palm Ulnar Palm 0.7   Ulnar Palm    1.6 <2.2 12.7       F Wave Studies   NR F-Lat (ms) Lat Norm (ms) L-R F-Lat (ms)  Right Ulnar (Mrkrs) (Abd Dig Min)  36C     27.27 <33    EMG   Side Muscle Ins Act Fibs Psw Fasc Number Recrt Dur Dur. Amp Amp. Poly Poly. Comment  Right 1stDorInt Nml Nml Nml Nml Nml Nml Nml Nml Nml Nml Nml Nml N/A  Right Abd Poll Brev Nml Nml Nml Nml Nml Nml Nml Nml Nml Nml Nml Nml N/A  Right FlexPolLong Nml Nml Nml Nml Nml Nml Nml Nml Nml Nml Nml Nml N/A  Right PronatorTeres Nml Nml Nml Nml Nml Nml Nml Nml Nml Nml Nml Nml N/A  Right Biceps  Nml Nml Nml Nml Nml Nml Nml Nml Nml Nml Nml Nml N/A  Right Triceps Nml Nml Nml Nml Nml Nml Nml Nml Nml Nml Nml Nml N/A  Right Deltoid Nml Nml Nml Nml Nml Nml Nml Nml Nml Nml Nml Nml N/A      Waveforms:

## 2014-10-25 ENCOUNTER — Other Ambulatory Visit (HOSPITAL_COMMUNITY): Payer: Self-pay | Admitting: Family Medicine

## 2014-10-25 DIAGNOSIS — Z1231 Encounter for screening mammogram for malignant neoplasm of breast: Secondary | ICD-10-CM

## 2014-11-06 ENCOUNTER — Ambulatory Visit (HOSPITAL_COMMUNITY)
Admission: RE | Admit: 2014-11-06 | Discharge: 2014-11-06 | Disposition: A | Discharge status: Home / Self Care | Payer: BC Managed Care – PPO | Source: Ambulatory Visit | Attending: Family Medicine | Admitting: Family Medicine

## 2014-11-06 DIAGNOSIS — Z1231 Encounter for screening mammogram for malignant neoplasm of breast: Secondary | ICD-10-CM | POA: Insufficient documentation

## 2015-01-02 ENCOUNTER — Telehealth: Payer: Self-pay | Admitting: Neurology

## 2015-01-02 NOTE — Telephone Encounter (Signed)
I spoke with patient and she said that the EMG report was sent to her rheumatologist but she would like it sent to her PCP since she is the one following her CTS.  Report faxed.

## 2015-01-02 NOTE — Telephone Encounter (Signed)
Pt requested her results from Nerve conduction test/ call back @ [phone]

## 2015-01-02 NOTE — Telephone Encounter (Signed)
Informed patient that the results are sent to the ordering doctor and that she should call them.

## 2015-01-24 ENCOUNTER — Encounter: Payer: Self-pay | Admitting: Obstetrics and Gynecology

## 2015-01-24 ENCOUNTER — Ambulatory Visit (INDEPENDENT_AMBULATORY_CARE_PROVIDER_SITE_OTHER): Payer: BC Managed Care – PPO | Admitting: Obstetrics and Gynecology

## 2015-01-24 VITALS — BP 100/70 | HR 78 | Ht 67.25 in | Wt 142.6 lb

## 2015-01-24 DIAGNOSIS — E049 Nontoxic goiter, unspecified: Secondary | ICD-10-CM

## 2015-01-24 DIAGNOSIS — Z124 Encounter for screening for malignant neoplasm of cervix: Secondary | ICD-10-CM

## 2015-01-24 DIAGNOSIS — Z01419 Encounter for gynecological examination (general) (routine) without abnormal findings: Secondary | ICD-10-CM

## 2015-01-24 DIAGNOSIS — Z0000 Encounter for general adult medical examination without abnormal findings: Secondary | ICD-10-CM

## 2015-01-24 LAB — POCT URINALYSIS DIPSTICK
Leukocytes, UA: NEGATIVE
PH UA: 5
UROBILINOGEN UA: NEGATIVE

## 2015-01-24 NOTE — Progress Notes (Signed)
59 y.o. G1P1 MarriedCaucasianF here for annual exam.  She had a mirena IUD placed last year for menorrhagia. She hasn't had any bleeding since April, prior to that it was sporadic. Prior to the IUD cycles were monthly and very heavy. She feels warmer than normal, no significant vasomotor symptoms. She is sexually active, occasional dryness, lubrication helps.   No LMP recorded. Patient is not currently having periods (Reason: IUD).          Sexually active: Yes.    The current method of family planning is IUD.    Exercising: Yes.    walking, stretching  qd Smoker:  no  Health Maintenance: Pap:  08/14/2010 wnl neg hr hpv History of abnormal Pap:  Yes, 36 years ago had colpo bx normal ever since   MMG:  11/06/14 breast density category c; bi-rads 1: negative  Colonoscopy:  08/10/2006 diverticulosis f/u due in 2018 ; 2012 colon resection due to diverticulosis, 2014 colostomy  BMD:   5-10 years ago  TDaP:  Unsure?, she will double check with her primary Screening Labs: no, Hb today: PCP, Urine today: Negative   reports that she quit smoking about 27 years ago. She has never used smokeless tobacco. She reports that she drinks about 4.2 oz of alcohol per week. She reports that she does not use illicit drugs.  Past Medical History  Diagnosis Date  . Heart murmur   . Asthma   . Diverticulitis of colon with perforation 04/20/2011  . Personal history of colonic polyps 04/23/2011  . Palpitations     CARDIAC STUDIES DONE 2009 - WAS TOLD THEY WERE NORMAL  . Arthritis   . Sjogren's syndrome   . Osteopenia   . Rash, skin     AROUND STOMA  . 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 Blood transfusion     AS INFANT  . Medical history non-contributory     Past Surgical History  Procedure Laterality Date  . Hernia repair    . Colon resection  04/22/2011    Procedure: COLON RESECTION;  Surgeon: Adin Hector, MD;  Location: [facility];  Service: General;  Laterality: N/A;  colon resection, drainage of abscess   . Colostomy  04/22/2011    Procedure: COLOSTOMY;  Surgeon: Adin Hector, MD;  Location: [facility];  Service: General;  Laterality: N/A;  . Myoma on right thigh  2002 (summer)  . Knee mass excision  1970'S    OSTEOCHONDROMA REMOVED  . Foreign removed  AGE 4    PEANUT REMOVED FROM LUNG  . Proctoscopy  08/14/2011    Procedure: PROCTOSCOPY;  Surgeon: Adin Hector, MD;  Location: [facility];  Service: General;  Laterality: N/A;  rigid  . Colon surgery  04/2011,07/2011    colostomy take down  . Dilatation & curettage/hysteroscopy with trueclear N/A 12/19/2013    Procedure: DILATATION & CURETTAGE/HYSTEROSCOPY WITH TRUCLEAR;  Surgeon: Azalia Bilis, MD;  Location: [facility];  Service: Gynecology;  Laterality: N/A;    Current Outpatient Prescriptions  Medication Sig Dispense Refill  . Cholecalciferol (VITAMIN D) 2000 UNITS tablet Take 1,000 Units by mouth daily.     . cycloSPORINE (RESTASIS) 0.05 % ophthalmic emulsion Place 1 drop into both eyes 2 (two) times daily.      . ferrous sulfate 325 (65 FE) MG EC tablet Take 325 mg by mouth daily. Takes every other day    . fluticasone (FLONASE) 50 MCG/ACT nasal spray Place 2 sprays into both nostrils daily.    [name]  methylcellulose (ARTIFICIAL TEARS) 1 % ophthalmic solution Place 1 drop into both eyes daily.    . Multiple Vitamins-Minerals (MULTIVITAMINS THER. W/MINERALS) TABS Take 1 tablet by mouth daily.      . naproxen sodium (ANAPROX) 220 MG tablet Take 220-440 mg by mouth once as needed. For pain    . psyllium (METAMUCIL) 58.6 % packet Take 1 packet by mouth daily.     No current facility-administered medications for this visit.    Family History  Problem Relation Age of Onset  . Hyperlipidemia Mother   . Hyperlipidemia Father   . Hyperlipidemia Sister   . Hyperlipidemia Brother     ROS:  Pertinent items are noted in HPI.  Otherwise, a comprehensive ROS was negative.  Exam:   BP 100/70 mmHg  Pulse 78  Ht 5' 7.25" (1.708 m)  Wt 142 lb 9.6  oz (64.683 kg)  BMI 22.17 kg/m2  Weight change: @WEIGHTCHANGE @ Height:   Height: 5' 7.25" (170.8 cm)  Ht Readings from Last 3 Encounters:  01/24/15 5' 7.25" (1.708 m)  01/29/14 5' 7.25" (1.708 m)  12/29/13 5' 7.25" (1.708 m)    General appearance: alert, cooperative and appears stated age Head: Normocephalic, without obvious abnormality, atraumatic Neck: no adenopathy, supple, symmetrical, trachea midline. 1.5 cm lump in the right lobe of the thyroid, left side is normal Lungs: clear to auscultation bilaterally Breasts: normal appearance, no masses or tenderness Heart: regular rate and rhythm Abdomen: soft, non-tender; bowel sounds normal; no masses,  no organomegaly Extremities: extremities normal, atraumatic, no cyanosis or edema Skin: Skin color, texture, turgor normal. No rashes or lesions Lymph nodes: Cervical, supraclavicular, and axillary nodes normal. No abnormal inguinal nodes palpated Neurologic: Grossly normal   Pelvic: External genitalia:  no lesions              Urethra:  normal appearing urethra with no masses, tenderness or lesions              Bartholins and Skenes: normal                 Vagina: normal appearing vagina with normal color and discharge, no lesions              Cervix: no lesions, IUD string 3 cm              Pap taken: Yes.   Bimanual Exam:  Uterus:  normal size, contour, position, consistency, mobility, non-tender, retroverted              Adnexa: normal adnexa and no mass, fullness, tenderness               Rectovaginal: Confirms               Anus:  normal sphincter tone, no lesions  Chaperone was present for exam.  A:  Well Woman with normal exam  Right lobe of thyroid is enlarged  P:   Pap with hpv  Colonoscopy and mammogram UTD  F/u  With primary for labs and immunizations  TFT's, thyroid ultrasound  Cc: Dr Darcus Austin

## 2015-01-24 NOTE — Patient Instructions (Signed)

## 2015-01-25 LAB — THYROID PANEL WITH TSH
Free Thyroxine Index: 2.3 (ref 1.4–3.8)
T3 Uptake: 31 % (ref 22–35)
T4, Total: 7.5 ug/dL (ref 4.5–12.0)
TSH: 1.624 u[IU]/mL (ref 0.350–4.500)

## 2015-01-28 LAB — IPS PAP TEST WITH HPV

## 2015-01-30 ENCOUNTER — Telehealth: Payer: Self-pay | Admitting: Emergency Medicine

## 2015-01-30 NOTE — Telephone Encounter (Signed)
Calling patient to schedule thyroid ultrasound.  Scheduled thyroid ultrasound for Friday 02/01/15 at 1310 at [address].   Message left to return call to [place] at [phone] to discuss.

## 2015-01-30 NOTE — Telephone Encounter (Signed)
Patient returned call and appointment information given. Patient agreeable. Will follow up with results.  Routing to provider for final review. Patient agreeable to disposition. Will close encounter.

## 2015-02-01 ENCOUNTER — Other Ambulatory Visit: Payer: BC Managed Care – PPO

## 2015-02-01 ENCOUNTER — Ambulatory Visit
Admission: RE | Admit: 2015-02-01 | Discharge: 2015-02-01 | Disposition: A | Discharge status: Home / Self Care | Payer: BC Managed Care – PPO | Source: Ambulatory Visit | Attending: Obstetrics and Gynecology | Admitting: Obstetrics and Gynecology

## 2015-02-01 DIAGNOSIS — E049 Nontoxic goiter, unspecified: Secondary | ICD-10-CM

## 2015-02-05 ENCOUNTER — Telehealth: Payer: Self-pay | Admitting: Obstetrics and Gynecology

## 2015-02-05 DIAGNOSIS — E041 Nontoxic single thyroid nodule: Secondary | ICD-10-CM

## 2015-02-05 NOTE — Telephone Encounter (Signed)
Dr.Jertson, I scheduled the patient at [facility] for their first available on 02/18/2015 at 2 pm with Dr.Ellison. Their address is [address] [place], [place] [address]. Their phone number is [phone]. Routing to you as FYI for when patient returns call. I would be happy to let her know of appointment date and time as well.

## 2015-02-05 NOTE — Telephone Encounter (Signed)
Called patient and left a message for her to call back. Her thyroid ultrasound from last week revealed an enlarging right thyroid cyst and bilateral nodules. One of the nodules on the right has enlarged, biopsy recommended. We will refer her to Endocrinology.

## 2015-02-06 ENCOUNTER — Encounter: Payer: Self-pay | Admitting: Obstetrics and Gynecology

## 2015-02-06 NOTE — Telephone Encounter (Signed)
Called patient again, left a message that I will try her again at the end of the day. If I don't reach her, I will send a my chart message.

## 2015-02-06 NOTE — Telephone Encounter (Signed)
I called the patient again, left a message that I would send her a my chart message. Message sent. I asked her to call you to confirm that she has gotten the message (details of the appointment were given). Thanks.

## 2015-02-06 NOTE — Telephone Encounter (Signed)
Returned call

## 2015-02-07 NOTE — Telephone Encounter (Signed)
Returned call. Patient did review your message through Mychart, no need to return her call unless you have more information.

## 2015-02-07 NOTE — Telephone Encounter (Signed)
Routing to Dr.Jertson as FYI. Will close encounter. 

## 2015-02-18 ENCOUNTER — Encounter: Payer: Self-pay | Admitting: Endocrinology

## 2015-02-18 ENCOUNTER — Ambulatory Visit: Payer: BC Managed Care – PPO | Admitting: Endocrinology

## 2015-02-18 ENCOUNTER — Ambulatory Visit (INDEPENDENT_AMBULATORY_CARE_PROVIDER_SITE_OTHER): Payer: BC Managed Care – PPO | Admitting: Endocrinology

## 2015-02-18 VITALS — BP 114/72 | HR 65 | Temp 98.6°F | Ht 67.25 in | Wt 141.0 lb

## 2015-02-18 DIAGNOSIS — E042 Nontoxic multinodular goiter: Secondary | ICD-10-CM | POA: Diagnosis not present

## 2015-02-18 NOTE — Progress Notes (Signed)
Subjective:    Patient ID: Annette Gentry, female    DOB: [birth year], 59 y.o.   MRN: [id number]  HPI Pt was noted to have a nodule at the thyroid in 2013.  She had aspiration of cystic fluid.  she is unaware of any prior thyroid problems.  she has no h/o XRT or surgery to the neck.  She has slight swelling at the anterior neck, but no assoc pain.  Past Medical History  Diagnosis Date  . Heart murmur   . Asthma   . Diverticulitis of colon with perforation 04/20/2011  . Personal history of colonic polyps 04/23/2011  . Palpitations     CARDIAC STUDIES DONE 2009 - WAS TOLD THEY WERE NORMAL  . Arthritis   . Sjogren's syndrome   . Osteopenia   . Rash, skin     AROUND STOMA  . 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 Blood transfusion     AS INFANT  . Medical history non-contributory     Past Surgical History  Procedure Laterality Date  . Hernia repair    . Colon resection  04/22/2011    Procedure: COLON RESECTION;  Surgeon: Adin Hector, MD;  Location: [facility];  Service: General;  Laterality: N/A;  colon resection, drainage of abscess  . Colostomy  04/22/2011    Procedure: COLOSTOMY;  Surgeon: Adin Hector, MD;  Location: [facility];  Service: General;  Laterality: N/A;  . Myoma on right thigh  2002 (summer)  . Knee mass excision  1970'S    OSTEOCHONDROMA REMOVED  . Foreign removed  AGE 17    PEANUT REMOVED FROM LUNG  . Proctoscopy  08/14/2011    Procedure: PROCTOSCOPY;  Surgeon: Adin Hector, MD;  Location: [facility];  Service: General;  Laterality: N/A;  rigid  . Colon surgery  04/2011,07/2011    colostomy take down  . Dilatation & curettage/hysteroscopy with trueclear N/A 12/19/2013    Procedure: DILATATION & CURETTAGE/HYSTEROSCOPY WITH TRUCLEAR;  Surgeon: Azalia Bilis, MD;  Location: [facility];  Service: Gynecology;  Laterality: N/A;    Social History   Social History  . Marital Status: Married    Spouse Name: N/A  . Number of Children: N/A  . Years of Education: N/A    Occupational History  . Not on file.   Social History Main Topics  . Smoking status: Former Smoker    Quit date: 08/04/1987  . Smokeless tobacco: Never Used  . Alcohol Use: 4.2 oz/week    7 Glasses of wine per week     Comment: moderate, less than one per day  . Drug Use: No  . Sexual Activity:    Partners: Male    Birth Control/ Protection: IUD   Other Topics Concern  . Not on file   Social History Narrative    Current Outpatient Prescriptions on File Prior to Visit  Medication Sig Dispense Refill  . Cholecalciferol (VITAMIN D) 2000 UNITS tablet Take 1,000 Units by mouth daily.     . cycloSPORINE (RESTASIS) 0.05 % ophthalmic emulsion Place 1 drop into both eyes 2 (two) times daily.      . ferrous sulfate 325 (65 FE) MG EC tablet Take 325 mg by mouth daily. Takes every other day    . fluticasone (FLONASE) 50 MCG/ACT nasal spray Place 2 sprays into both nostrils daily.    . methylcellulose (ARTIFICIAL TEARS) 1 % ophthalmic solution Place 1 drop into both eyes daily.    . Multiple Vitamins-Minerals (MULTIVITAMINS  THER. W/MINERALS) TABS Take 1 tablet by mouth daily.      . naproxen sodium (ANAPROX) 220 MG tablet Take 220-440 mg by mouth once as needed. For pain    . psyllium (METAMUCIL) 58.6 % packet Take 1 packet by mouth daily.     No current facility-administered medications on file prior to visit.    Allergies  Allergen Reactions  . Codeine Nausea Only  . Polyethyl Glycol-Propyl Glycol Other (See Comments)    Itching and redness     Family History  Problem Relation Age of Onset  . Hyperlipidemia Mother   . Hyperlipidemia Father   . Hyperlipidemia Sister   . Hyperlipidemia Brother   . Thyroid disease Neg Hx     BP 114/72 mmHg  Pulse 65  Temp(Src) 98.6 F (37 C) (Oral)  Ht 5' 7.25" (1.708 m)  Wt 141 lb (63.957 kg)  BMI 21.92 kg/m2  SpO2 98%    Review of Systems denies weight loss, headache, hoarseness, visual loss, palpitations, sob, polyuria, muscle  weakness, tremor, insomnia, easy bruising, and rhinorrhea.  Denies dysphagia, heat intolerance, diaphoresis, and neck pain.  She has bowel frequency since partial colectomy.      Objective:   Physical Exam VS: see vs page GEN: no distress HEAD: head: no deformity eyes: no periorbital swelling, no proptosis external nose and ears are normal mouth: no lesion seen NECK: approx 3 cm right thyroid nodule, which is freely mobile.  CHEST WALL: no deformity LUNGS:  Clear to auscultation CV: reg rate and rhythm, no murmur ABD: abdomen is soft, nontender.  no hepatosplenomegaly.  not distended.  no hernia. MUSCULOSKELETAL: muscle bulk and strength are grossly normal.  no obvious joint swelling.  gait is normal and steady EXTEMITIES: no deformity.  no ulcer on the feet.  feet are of normal color and temp.  no edema PULSES: dorsalis pedis intact bilat.  no carotid bruit NEURO:  cn 2-12 grossly intact.   readily moves all 4's.  sensation is intact to touch on the feet SKIN:  Normal texture and temperature.  No rash or suspicious lesion is visible.   NODES:  None palpable at the neck PSYCH: alert, well-oriented.  Does not appear anxious nor depressed.   Thyroid bx cytol (2013): no diagnostic cells. Lab Results  Component Value Date   TSH 1.624 01/24/2015   T4TOTAL 7.5 01/24/2015  radiol: thyroid US (02/01/15): Bilateral thyroid nodules with a dominant right thyroid cyst.  I have reviewed outside records, and summarized: Pt was noted to have palpable cyst and abnormal [place], and referred here.       Assessment & Plan:  Thyroid cyst, new to me.  Although it is slightly bigger, it has low risk [place] appearance.  We discussed options (observation, repeat bx, and ref surgery).  Pt chooses observation for now.    Patient is advised the following: Patient Instructions  You can hold off on any further thyroid testing or treatment for now. Please come back for a follow-up appointment in 6-12 months. most  of the time, a "lumpy thyroid" will eventually become overactive.  this is usually a slow process, happening over the span of many years.

## 2015-02-18 NOTE — Patient Instructions (Signed)
You can hold off on any further thyroid testing or treatment for now. Please come back for a follow-up appointment in 6-12 months. most of the time, a "lumpy thyroid" will eventually become overactive.  this is usually a slow process, happening over the span of many years.

## 2015-02-19 DIAGNOSIS — E042 Nontoxic multinodular goiter: Secondary | ICD-10-CM | POA: Insufficient documentation

## 2015-05-30 ENCOUNTER — Other Ambulatory Visit: Payer: Self-pay | Admitting: Otolaryngology

## 2015-05-30 ENCOUNTER — Ambulatory Visit
Admission: RE | Admit: 2015-05-30 | Discharge: 2015-05-30 | Disposition: A | Discharge status: Home / Self Care | Payer: BC Managed Care – PPO | Source: Ambulatory Visit | Attending: Otolaryngology | Admitting: Otolaryngology

## 2015-05-30 DIAGNOSIS — J324 Chronic pansinusitis: Secondary | ICD-10-CM

## 2015-11-15 ENCOUNTER — Other Ambulatory Visit: Payer: Self-pay | Admitting: Family Medicine

## 2015-11-15 DIAGNOSIS — Z1231 Encounter for screening mammogram for malignant neoplasm of breast: Secondary | ICD-10-CM

## 2015-11-25 ENCOUNTER — Ambulatory Visit
Admission: RE | Admit: 2015-11-25 | Discharge: 2015-11-25 | Disposition: A | Discharge status: Home / Self Care | Payer: BC Managed Care – PPO | Source: Ambulatory Visit | Attending: Family Medicine | Admitting: Family Medicine

## 2015-11-25 DIAGNOSIS — Z1231 Encounter for screening mammogram for malignant neoplasm of breast: Secondary | ICD-10-CM

## 2016-02-06 ENCOUNTER — Ambulatory Visit: Payer: BC Managed Care – PPO | Admitting: Obstetrics and Gynecology

## 2016-02-18 ENCOUNTER — Encounter: Payer: Self-pay | Admitting: Endocrinology

## 2016-02-18 ENCOUNTER — Ambulatory Visit (INDEPENDENT_AMBULATORY_CARE_PROVIDER_SITE_OTHER): Payer: BC Managed Care – PPO | Admitting: Endocrinology

## 2016-02-18 VITALS — BP 112/60 | HR 76 | Ht 67.25 in | Wt 146.0 lb

## 2016-02-18 DIAGNOSIS — E042 Nontoxic multinodular goiter: Secondary | ICD-10-CM

## 2016-02-18 NOTE — Patient Instructions (Addendum)
Please recheck the ultrasound.  you will receive a phone call, about a day and time for an appointment. If there is growth in a solid nodule, you should have a biopsy done, under ultrasound.  Please come back for a follow-up appointment in 1-2 years. most of the time, a "lumpy thyroid" will eventually become overactive.  this is usually a slow process, happening over the span of many years.

## 2016-02-18 NOTE — Progress Notes (Signed)
Subjective:    Patient ID: Annette Gentry, female    DOB: 05-22-1956, 60 y.o.   MRN: [id number]  HPI Pt returns for f/u of multinodular goiter (dx'ed 2013, when she had aspiration of cystic fluid (cytol: no diagnostic cells); f/u US in 2016 showed bilateral thyroid nodules with a dominant right thyroid cyst. The dominant right thyroid cyst has enlarged; pt declined bx then).   pt states she feels well in general, except for heat intolerance.  Past Medical History:  Diagnosis Date  . Arthritis   . Asthma   . Blood transfusion    AS INFANT  . Colostomy in place [facility]) 04/2011  . Diverticulitis of colon with perforation 04/20/2011  . Heart murmur   . Medical history non-contributory   . Osteopenia   . Palpitations    CARDIAC STUDIES DONE 2009 - WAS TOLD THEY WERE NORMAL  . Personal history of colonic polyps 04/23/2011  . Rash, skin    AROUND STOMA  . Sjogren's syndrome [facility])     Past Surgical History:  Procedure Laterality Date  . COLON RESECTION  04/22/2011   Procedure: COLON RESECTION;  Surgeon: Adin Hector, MD;  Location: [facility];  Service: General;  Laterality: N/A;  colon resection, drainage of abscess  . COLON SURGERY  04/2011,07/2011   colostomy take down  . COLOSTOMY  04/22/2011   Procedure: COLOSTOMY;  Surgeon: Adin Hector, MD;  Location: [facility];  Service: General;  Laterality: N/A;  . DILATATION & CURETTAGE/HYSTEROSCOPY WITH TRUECLEAR N/A 12/19/2013   Procedure: DILATATION & CURETTAGE/HYSTEROSCOPY WITH TRUCLEAR;  Surgeon: Azalia Bilis, MD;  Location: [place] [facility];  Service: Gynecology;  Laterality: N/A;  . FOREIGN REMOVED  AGE 89   PEANUT REMOVED FROM LUNG  . HERNIA REPAIR    . KNEE MASS EXCISION  1970'S   OSTEOCHONDROMA REMOVED  . myoma on right thigh  2002 (summer)  . PROCTOSCOPY  08/14/2011   Procedure: PROCTOSCOPY;  Surgeon: Adin Hector, MD;  Location: [facility];  Service: General;  Laterality: N/A;  rigid    Social History   Social History  . Marital  status: Married    Spouse name: N/A  . Number of children: N/A  . Years of education: N/A   Occupational History  . Not on file.   Social History Main Topics  . Smoking status: Former Smoker    Quit date: 08/04/1987  . Smokeless tobacco: Never Used  . Alcohol use 4.2 oz/week    7 Glasses of wine per week     Comment: moderate, less than one per day  . Drug use: No  . Sexual activity: Yes    Partners: Male    Birth control/ protection: IUD   Other Topics Concern  . Not on file   Social History Narrative  . No narrative on file    Current Outpatient Prescriptions on File Prior to Visit  Medication Sig Dispense Refill  . Cholecalciferol (VITAMIN D) 2000 UNITS tablet Take 1,000 Units by mouth daily.     . cycloSPORINE (RESTASIS) 0.05 % ophthalmic emulsion Place 1 drop into both eyes 2 (two) times daily.      . fluticasone (FLONASE) 50 MCG/ACT nasal spray Place 2 sprays into both nostrils daily.    . methylcellulose (ARTIFICIAL TEARS) 1 % ophthalmic solution Place 1 drop into both eyes daily.    . Multiple Vitamins-Minerals (MULTIVITAMINS THER. W/MINERALS) TABS Take 1 tablet by mouth daily.      . naproxen sodium (  ANAPROX) 220 MG tablet Take 220-440 mg by mouth once as needed. For pain    . psyllium (METAMUCIL) 58.6 % packet Take 1 packet by mouth daily.     No current facility-administered medications on file prior to visit.     Allergies  Allergen Reactions  . Codeine Nausea Only  . Polyethyl Glycol-Propyl Glycol Other (See Comments)    Itching and redness     Family History  Problem Relation Age of Onset  . Hyperlipidemia Mother   . Hyperlipidemia Father   . Hyperlipidemia Sister   . Hyperlipidemia Brother   . Thyroid disease Neg Hx     BP 112/60   Pulse 76   Ht 5' 7.25" (1.708 m)   Wt 146 lb (66.2 kg)   SpO2 98%   BMI 22.70 kg/m   Review of Systems Denies neck pain.     Objective:   Physical Exam VITAL SIGNS:  See vs page GENERAL: no distress NECK:  approx 3 cm right thyroid nodule, which is freely mobile.   outside test results are reviewed: TSH=1.93    Assessment & Plan:  multinodular goiter, due for f/u US.

## 2016-02-19 ENCOUNTER — Encounter: Payer: Self-pay | Admitting: Obstetrics and Gynecology

## 2016-02-19 ENCOUNTER — Ambulatory Visit (INDEPENDENT_AMBULATORY_CARE_PROVIDER_SITE_OTHER): Payer: BC Managed Care – PPO | Admitting: Obstetrics and Gynecology

## 2016-02-19 VITALS — BP 112/62 | HR 80 | Resp 13 | Ht 67.0 in | Wt 145.0 lb

## 2016-02-19 DIAGNOSIS — Z01419 Encounter for gynecological examination (general) (routine) without abnormal findings: Secondary | ICD-10-CM | POA: Diagnosis not present

## 2016-02-19 NOTE — Progress Notes (Addendum)
60 y.o. G1P1 MarriedCaucasianF here for annual exam.  The patient had a mirena IUD placed in 2015 for menorrhagia. Few episodes of bleeding for a few days over the last year (light). She has heat intolerance, no hot flashes. She gets warm at night, wakes up sometimes (helped if the room is cooler). Up about 1 x a night, tolerable. Mild vaginal dryness. Sexually active, lubricant helps.     No LMP recorded. Patient is not currently having periods (Reason: IUD).          Sexually active: Yes.    The current method of family planning is IUD.    Exercising: Yes.    walking/yardwork Smoker:  no  Health Maintenance: Pap:  01-24-15 WNL NEG HR HPV History of abnormal Pap:  Yes- years Ago MMG:  11-25-15 WNL Colonoscopy:  09-24-11 repeat in 10 years BMD:   2017 Dr. Inda Merlin WNL per patient  TDaP:  Unsure, she will check with her primary Gardasil: N/A   reports that she quit smoking about 28 years ago. She has never used smokeless tobacco. She reports that she drinks about 4.2 oz of alcohol per week . 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 Husband is in the Physics department. She has a 42 year old daughter. 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   Past Medical History:  Diagnosis Date  . Arthritis   . Asthma   . Blood transfusion    AS INFANT  . 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 Diverticulitis of colon with perforation 04/20/2011  . Heart murmur   . Medical history non-contributory   . Osteopenia   . Palpitations    CARDIAC STUDIES DONE 2009 - WAS TOLD THEY WERE NORMAL  . Personal history of colonic polyps 04/23/2011  . Rash, skin    AROUND STOMA  . Sjogren's syndrome [facility])     Past Surgical History:  Procedure Laterality Date  . COLON RESECTION  04/22/2011   Procedure: COLON RESECTION;  Surgeon: Adin Hector, MD;  Location: [facility];  Service: General;  Laterality: N/A;   colon resection, drainage of abscess  . COLON SURGERY  04/2011,07/2011   colostomy take down  . COLOSTOMY  04/22/2011   Procedure: COLOSTOMY;  Surgeon: Adin Hector, MD;  Location: [facility];  Service: General;  Laterality: N/A;  . DILATATION & CURETTAGE/HYSTEROSCOPY WITH TRUECLEAR N/A 12/19/2013   Procedure: DILATATION & CURETTAGE/HYSTEROSCOPY WITH TRUCLEAR;  Surgeon: Azalia Bilis, MD;  Location: [facility];  Service: Gynecology;  Laterality: N/A;  . FOREIGN REMOVED  AGE 86   PEANUT REMOVED FROM LUNG  . HERNIA REPAIR    . KNEE MASS EXCISION  1970'S   OSTEOCHONDROMA REMOVED  . myoma on right thigh  2002 (summer)  . PROCTOSCOPY  08/14/2011   Procedure: PROCTOSCOPY;  Surgeon: Adin Hector, MD;  Location: [facility];  Service: General;  Laterality: N/A;  rigid    Current Outpatient Prescriptions  Medication Sig Dispense Refill  . Cholecalciferol (VITAMIN D) 2000 UNITS tablet Take 1,000 Units by mouth daily.     . cycloSPORINE (RESTASIS) 0.05 % ophthalmic emulsion Place 1 drop into both eyes 2 (two) times daily.      . fluticasone (FLONASE) 50 MCG/ACT nasal spray Place 2 sprays into both nostrils daily.    . methylcellulose (ARTIFICIAL TEARS) 1 % ophthalmic solution Place 1 drop into both eyes daily.    [name]  Multiple Vitamins-Minerals (MULTIVITAMINS THER. W/MINERALS) TABS Take 1 tablet by mouth daily.      . naproxen sodium (ANAPROX) 220 MG tablet Take 220-440 mg by mouth once as needed. For pain    . psyllium (METAMUCIL) 58.6 % packet Take 1 packet by mouth daily.     No current facility-administered medications for this visit.     Family History  Problem Relation Age of Onset  . Hyperlipidemia Mother   . Hyperlipidemia Father   . Hyperlipidemia Sister   . Hyperlipidemia Brother   . Thyroid disease Neg Hx     Review of Systems  Constitutional: Negative.   HENT: Negative.   Eyes: Negative.   Respiratory: Negative.   Cardiovascular: Negative.   Gastrointestinal: Negative.    Endocrine: Negative.   Genitourinary: Negative.   Musculoskeletal: Negative.   Skin: Negative.   Allergic/Immunologic: Negative.   Neurological: Negative.   Psychiatric/Behavioral: Negative.     Exam:   BP 112/62 (BP Location: Right Arm, Patient Position: Sitting, Cuff Size: Normal)   Pulse 80   Resp 13   Ht 5\' 7"  (1.702 m)   Wt 145 lb (65.8 kg)   BMI 22.71 kg/m   Weight change: @WEIGHTCHANGE @ Height:   Height: 5\' 7"  (170.2 cm)  Ht Readings from Last 3 Encounters:  02/19/16 5\' 7"  (1.702 m)  02/18/16 5' 7.25" (1.708 m)  02/18/15 5' 7.25" (1.708 m)    General appearance: alert, cooperative and appears stated age Head: Normocephalic, without obvious abnormality, atraumatic Neck: no adenopathy, supple, symmetrical, trachea midline and thyroid with enlargement on the right, persistent 1-1.5 cm lump (cyst on ultrasound) Lungs: clear to auscultation bilaterally Breasts: normal appearance, no masses or tenderness Heart: regular rate and rhythm Abdomen: soft, non-tender; bowel sounds normal; no masses,  no organomegaly Extremities: extremities normal, atraumatic, no cyanosis or edema Skin: Skin color, texture, turgor normal. No rashes or lesions Lymph nodes: Cervical, supraclavicular, and axillary nodes normal. No abnormal inguinal nodes palpated Neurologic: Grossly normal   Pelvic: External genitalia:  no lesions              Urethra:  normal appearing urethra with no masses, tenderness or lesions              Bartholins and Skenes: normal                 Vagina: normal appearing vagina with normal color and discharge, no lesions              Cervix: no lesions and IUD string 4 cm               Bimanual Exam:  Uterus:  normal size, contour, position, consistency, mobility, non-tender and retroverted              Adnexa: no mass, fullness, tenderness               Rectovaginal: Confirms               Anus:  normal sphincter tone, no lesions  Chaperone was present for  exam.  A:  Well Woman with normal exam  She had a mirena IUD inserted 2 years ago for menorrhagia, only mild menopausal symptoms, still with occasional light bleeding  P:   No pap this year  Mammogram and colonoscopy UTD  Discussed breast self exam  Discussed calcium and vit D intake  Consider checking an [place] next year and possible IUD removal.   Labs and immunizations with her  primary MD

## 2016-02-19 NOTE — Patient Instructions (Signed)
EXERCISE AND DIET:  We recommended that you start or continue a regular exercise program for good health. Regular exercise means any activity that makes your heart beat faster and makes you sweat.  We recommend exercising at least 30 minutes per day at least 3 days a week, preferably 4 or 5.  We also recommend a diet low in fat and sugar.  Inactivity, poor dietary choices and obesity can cause diabetes, heart attack, stroke, and kidney damage, among others.    ALCOHOL AND SMOKING:  Women should limit their alcohol intake to no more than 7 drinks/beers/glasses of wine (combined, not each!) per week. Moderation of alcohol intake to this level decreases your risk of breast cancer and liver damage. And of course, no recreational drugs are part of a healthy lifestyle.  And absolutely no smoking or even second hand smoke. Most people know smoking can cause heart and lung diseases, but did you know it also contributes to weakening of your bones? Aging of your skin?  Yellowing of your teeth and nails?  CALCIUM AND VITAMIN D:  Adequate intake of calcium and Vitamin D are recommended.  The recommendations for exact amounts of these supplements seem to change often, but generally speaking 600 mg of calcium (either carbonate or citrate) and 800 units of Vitamin D per day seems prudent. Certain women may benefit from higher intake of Vitamin D.  If you are among these women, your doctor will have told you during your visit.    PAP SMEARS:  Pap smears, to check for cervical cancer or precancers,  have traditionally been done yearly, although recent scientific advances have shown that most women can have pap smears less often.  However, every woman still should have a physical exam from her gynecologist every year. It will include a breast check, inspection of the vulva and vagina to check for abnormal growths or skin changes, a visual exam of the cervix, and then an exam to evaluate the size and shape of the uterus and  ovaries.  And after 59 years of age, a rectal exam is indicated to check for rectal cancers. We will also provide age appropriate advice regarding health maintenance, like when you should have certain vaccines, screening for sexually transmitted diseases, bone density testing, colonoscopy, mammograms, etc.   MAMMOGRAMS:  All women over 40 years old should have a yearly mammogram. Many facilities now offer a "3D" mammogram, which may cost around $50 extra out of pocket. If possible,  we recommend you accept the option to have the 3D mammogram performed.  It both reduces the number of women who will be called back for extra views which then turn out to be normal, and it is better than the routine mammogram at detecting truly abnormal areas.    COLONOSCOPY:  Colonoscopy to screen for colon cancer is recommended for all women at age 50.  We know, you hate the idea of the prep.  We agree, BUT, having colon cancer and not knowing it is worse!!  Colon cancer so often starts as a polyp that can be seen and removed at colonscopy, which can quite literally save your life!  And if your first colonoscopy is normal and you have no family history of colon cancer, most women don't have to have it again for 10 years.  Once every ten years, you can do something that may end up saving your life, right?  We will be happy to help you get it scheduled when you are ready.    Be sure to check your insurance coverage so you understand how much it will cost.  It may be covered as a preventative service at no cost, but you should check your particular policy.      Breast Self-Awareness Practicing breast self-awareness may pick up problems early, prevent significant medical complications, and possibly save your life. By practicing breast self-awareness, you can become familiar with how your breasts look and feel and if your breasts are changing. This allows you to notice changes early. It can also offer you some reassurance that your  breast health is good. One way to learn what is normal for your breasts and whether your breasts are changing is to do a breast self-exam. If you find a lump or something that was not present in the past, it is best to contact your caregiver right away. Other findings that should be evaluated by your caregiver include nipple discharge, especially if it is bloody; skin changes or reddening; areas where the skin seems to be pulled in (retracted); or new lumps and bumps. Breast pain is seldom associated with cancer (malignancy), but should also be evaluated by a caregiver. HOW TO PERFORM A BREAST SELF-EXAM The best time to examine your breasts is 5-7 days after your menstrual period is over. During menstruation, the breasts are lumpier, and it may be more difficult to pick up changes. If you do not menstruate, have reached menopause, or had your uterus removed (hysterectomy), you should examine your breasts at regular intervals, such as monthly. If you are breastfeeding, examine your breasts after a feeding or after using a breast pump. Breast implants do not decrease the risk for lumps or tumors, so continue to perform breast self-exams as recommended. Talk to your caregiver about how to determine the difference between the implant and breast tissue. Also, talk about the amount of pressure you should use during the exam. Over time, you will become more familiar with the variations of your breasts and more comfortable with the exam. A breast self-exam requires you to remove all your clothes above the waist. 1. Look at your breasts and nipples. Stand in front of a mirror in a room with good lighting. With your hands on your hips, push your hands firmly downward. Look for a difference in shape, contour, and size from one breast to the other (asymmetry). Asymmetry includes puckers, dips, or bumps. Also, look for skin changes, such as reddened or scaly areas on the breasts. Look for nipple changes, such as discharge,  dimpling, repositioning, or redness. 2. Carefully feel your breasts. This is best done either in the shower or tub while using soapy water or when flat on your back. Place the arm (on the side of the breast you are examining) above your head. Use the pads (not the fingertips) of your three middle fingers on your opposite hand to feel your breasts. Start in the underarm area and use  inch (2 cm) overlapping circles to feel your breast. Use 3 different levels of pressure (light, medium, and firm pressure) at each circle before moving to the next circle. The light pressure is needed to feel the tissue closest to the skin. The medium pressure will help to feel breast tissue a little deeper, while the firm pressure is needed to feel the tissue close to the ribs. Continue the overlapping circles, moving downward over the breast until you feel your ribs below your breast. Then, move one finger-width towards the center of the body. Continue to use the    inch (2 cm) overlapping circles to feel your breast as you move slowly up toward the collar bone (clavicle) near the base of the neck. Continue the up and down exam using all 3 pressures until you reach the middle of the chest. Do this with each breast, carefully feeling for lumps or changes. 3.  Keep a written record with breast changes or normal findings for each breast. By writing this information down, you do not need to depend only on memory for size, tenderness, or location. Write down where you are in your menstrual cycle, if you are still menstruating. Breast tissue can have some lumps or thick tissue. However, see your caregiver if you find anything that concerns you.  SEEK MEDICAL CARE IF:  You see a change in shape, contour, or size of your breasts or nipples.   You see skin changes, such as reddened or scaly areas on the breasts or nipples.   You have an unusual discharge from your nipples.   You feel a new lump or unusually thick areas.     This information is not intended to replace advice given to you by your health care provider. Make sure you discuss any questions you have with your health care provider.   Document Released: 05/18/2005 Document Revised: 05/04/2012 Document Reviewed: 09/02/2011 Elsevier Interactive Patient Education 2016 [employer]  

## 2016-03-05 ENCOUNTER — Ambulatory Visit
Admission: RE | Admit: 2016-03-05 | Discharge: 2016-03-05 | Disposition: A | Discharge status: Home / Self Care | Payer: BC Managed Care – PPO | Source: Ambulatory Visit | Attending: Endocrinology | Admitting: Endocrinology

## 2016-03-05 DIAGNOSIS — E042 Nontoxic multinodular goiter: Secondary | ICD-10-CM

## 2016-04-06 ENCOUNTER — Other Ambulatory Visit: Payer: Self-pay | Admitting: Family Medicine

## 2016-04-06 ENCOUNTER — Ambulatory Visit
Admission: RE | Admit: 2016-04-06 | Discharge: 2016-04-06 | Disposition: A | Discharge status: Home / Self Care | Payer: BC Managed Care – PPO | Source: Ambulatory Visit | Attending: Family Medicine | Admitting: Family Medicine

## 2016-04-06 DIAGNOSIS — T1490XA Injury, unspecified, initial encounter: Secondary | ICD-10-CM

## 2016-06-30 DIAGNOSIS — R0683 Snoring: Secondary | ICD-10-CM | POA: Insufficient documentation

## 2016-09-27 ENCOUNTER — Emergency Department (HOSPITAL_COMMUNITY): Payer: BC Managed Care – PPO

## 2016-09-27 ENCOUNTER — Encounter (HOSPITAL_COMMUNITY): Payer: Self-pay

## 2016-09-27 ENCOUNTER — Emergency Department (HOSPITAL_COMMUNITY)
Admission: EM | Admit: 2016-09-27 | Discharge: 2016-09-27 | Disposition: A | Discharge status: Home / Self Care | Payer: BC Managed Care – PPO | Attending: Emergency Medicine | Admitting: Emergency Medicine

## 2016-09-27 DIAGNOSIS — Y929 Unspecified place or not applicable: Secondary | ICD-10-CM | POA: Diagnosis not present

## 2016-09-27 DIAGNOSIS — Y939 Activity, unspecified: Secondary | ICD-10-CM | POA: Diagnosis not present

## 2016-09-27 DIAGNOSIS — Y999 Unspecified external cause status: Secondary | ICD-10-CM | POA: Diagnosis not present

## 2016-09-27 DIAGNOSIS — Z87891 Personal history of nicotine dependence: Secondary | ICD-10-CM | POA: Diagnosis not present

## 2016-09-27 DIAGNOSIS — S59901A Unspecified injury of right elbow, initial encounter: Secondary | ICD-10-CM | POA: Diagnosis present

## 2016-09-27 DIAGNOSIS — M7021 Olecranon bursitis, right elbow: Secondary | ICD-10-CM | POA: Insufficient documentation

## 2016-09-27 DIAGNOSIS — M25521 Pain in right elbow: Secondary | ICD-10-CM

## 2016-09-27 DIAGNOSIS — J45909 Unspecified asthma, uncomplicated: Secondary | ICD-10-CM | POA: Diagnosis not present

## 2016-09-27 DIAGNOSIS — W1839XA Other fall on same level, initial encounter: Secondary | ICD-10-CM | POA: Insufficient documentation

## 2016-09-27 NOTE — Discharge Instructions (Signed)
Use ace wrap to help with pain and to provide compression to the area to help with swelling. Ice and elevate elbow throughout the day, using ice pack for no more than 20 minutes every hour.  Alternate between tylenol and motrin as needed for pain relief. Follow up with the orthopedist in 5-7 days for recheck of symptoms and ongoing management of your elbow injury. Return to the ER for changes or worsening symptoms.

## 2016-09-27 NOTE — ED Notes (Signed)
PT DISCHARGED. INSTRUCTIONS GIVEN. AAOX4. PT IN NO APPARENT DISTRESS. THE OPPORTUNITY TO ASK QUESTIONS WAS PROVIDED. 

## 2016-09-27 NOTE — ED Triage Notes (Signed)
Pt fell hard on rt elbow 3 weeks ago.  Has noted tightness and swelling in site with increased pain on movement.

## 2016-09-27 NOTE — ED Provider Notes (Signed)
[facility] DEPT Provider Note   CSN: [id number] Arrival date & time: 09/27/16  1658  By signing my name below, I, Reola Mosher, attest that this documentation has been prepared under the direction and in the presence of [address] [employer]. Electronically Signed: Reola Mosher, ED Scribe. 09/27/16. 5:25 PM.  History   Chief Complaint Chief Complaint  Patient presents with  . Elbow Pain   The history is provided by the patient and medical records. No language interpreter was used.  Shoulder Pain   This is a new problem. The current episode started 1 to 2 hours ago. The problem occurs constantly. The problem has not changed since onset.The pain is present in the right elbow. The quality of the pain is described as aching. The pain is at a severity of 2/10. The pain is mild. Pertinent negatives include no numbness, full range of motion and no tingling. The symptoms are aggravated by activity (movement of the joint). She has tried nothing for the symptoms. The treatment provided no relief. There has been a history of trauma.    Annette Gentry is a 61 y.o. female with a PMHx of arthritis, sjogren's syndrome, osteopenia, remote osteochondroma of R knee s/p resection, and other medical conditions listed below, who presents to the ED with complaints of sudden onset right elbow swelling beginning 2 hours ago. Per pt, she sustained a mechanical fall three weeks ago, striking her elbow on the staircase; had some mild pain and persistent bruising over the area since then but didn't think much of it and just thought it was a bruise, states it was gradually improving with time. This afternoon, while sweeping outside, and doing repetitive movements with her elbow, she noticed it became "tight" and she noticed significant swelling to the dorsal aspect of the olecranon area. No recent re-injury to the area. She describes the pain as 2/10 and mildly worsening, constant, aching, non-radiating R  elbow pain, worse with movement, and without alleviated factors. No treatments were tried since the onset of her swelling. She had previously used naprosyn for her pain which had helped. No hx of similar symptoms/conditions. She denies ongoing bruising, redness, warmth, skin injury/abrasion, loss of ROM, fevers, chills, CP, SOB, abd pain, N/V/D/C, hematuria, dysuria, myalgias, numbness, tingling, focal weakness, or any other complaints at this time.   Past Medical History:  Diagnosis Date  . Arthritis   . Asthma   . Blood transfusion    AS INFANT  . 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 Diverticulitis of colon with perforation 04/20/2011  . Heart murmur   . Medical history non-contributory   . Multinodular goiter   . Osteopenia   . Palpitations    CARDIAC STUDIES DONE 2009 - WAS TOLD THEY WERE NORMAL  . Personal history of colonic polyps 04/23/2011  . Rash, skin    AROUND STOMA  . Sjogren's syndrome [facility])    Patient Active Problem List   Diagnosis Date Noted  . Multinodular goiter 02/19/2015  . IUD (intrauterine device) in place 01/29/2014  . Fibroid, uterine 11/24/2013  . Menorrhagia, premenopausal 11/24/2013  . Personal history of colonic polyps 04/23/2011  . Diverticulitis of colon with perforation 04/20/2011  . Sjoegren syndrome ([place]) 04/17/2011  . Asthma 04/17/2011   Past Surgical History:  Procedure Laterality Date  . COLON RESECTION  04/22/2011   Procedure: COLON RESECTION;  Surgeon: Adin Hector, MD;  Location: [facility];  Service: General;  Laterality: N/A;  colon resection, drainage of abscess  .  COLON SURGERY  04/2011,07/2011   colostomy take down  . COLOSTOMY  04/22/2011   Procedure: COLOSTOMY;  Surgeon: Adin Hector, MD;  Location: [facility];  Service: General;  Laterality: N/A;  . DILATATION & CURETTAGE/HYSTEROSCOPY WITH TRUECLEAR N/A 12/19/2013   Procedure: DILATATION & CURETTAGE/HYSTEROSCOPY WITH TRUCLEAR;  Surgeon: Azalia Bilis, MD;  Location: [place] [facility];   Service: Gynecology;  Laterality: N/A;  . FOREIGN REMOVED  AGE 20   PEANUT REMOVED FROM LUNG  . HERNIA REPAIR    . KNEE MASS EXCISION  1970'S   OSTEOCHONDROMA REMOVED  . myoma on right thigh  2002 (summer)  . PROCTOSCOPY  08/14/2011   Procedure: PROCTOSCOPY;  Surgeon: Adin Hector, MD;  Location: [facility];  Service: General;  Laterality: N/A;  rigid   OB History    Gravida Para Term Preterm AB Living   1 1       1    SAB TAB Ectopic Multiple Live Births                 Home Medications    Prior to Admission medications   Medication Sig Start Date End Date Taking? Authorizing Provider  Cholecalciferol (VITAMIN D) 2000 UNITS tablet Take 1,000 Units by mouth daily.     Historical Provider, MD  cycloSPORINE (RESTASIS) 0.05 % ophthalmic emulsion Place 1 drop into both eyes 2 (two) times daily.      Historical Provider, MD  fluticasone (FLONASE) 50 MCG/ACT nasal spray Place 2 sprays into both nostrils daily.    Historical Provider, MD  methylcellulose (ARTIFICIAL TEARS) 1 % ophthalmic solution Place 1 drop into both eyes daily.    Historical Provider, MD  Multiple Vitamins-Minerals (MULTIVITAMINS THER. W/MINERALS) TABS Take 1 tablet by mouth daily.      Historical Provider, MD  naproxen sodium (ANAPROX) 220 MG tablet Take 220-440 mg by mouth once as needed. For pain    Historical Provider, MD  psyllium (METAMUCIL) 58.6 % packet Take 1 packet by mouth daily.    Historical Provider, MD   Family History Family History  Problem Relation Age of Onset  . Hyperlipidemia Mother   . Hyperlipidemia Father   . Parkinson's disease Father   . Hyperlipidemia Sister   . Hyperlipidemia Brother   . Thyroid disease Neg Hx    Social History Social History  Substance Use Topics  . Smoking status: Former Smoker    Quit date: 08/04/1987  . Smokeless tobacco: Never Used  . Alcohol use 4.2 oz/week    7 Glasses of wine per week     Comment: moderate, less than one per day   Allergies   Codeine and  Polyethyl glycol-propyl glycol  Review of Systems Review of Systems  Constitutional: Negative for chills and fever.  Respiratory: Negative for shortness of breath.   Cardiovascular: Negative for chest pain.  Gastrointestinal: Negative for abdominal pain, constipation, diarrhea, nausea and vomiting.  Genitourinary: Negative for dysuria and hematuria.  Musculoskeletal: Positive for arthralgias (right elbow) and joint swelling (right elbow). Negative for myalgias.  Skin: Positive for color change ( +bruising to right elbow previously but now resolved; no redness/warmth or ongoing bruising). Negative for wound.  Allergic/Immunologic: Negative for immunocompromised state.  Neurological: Negative for tingling, weakness and numbness.  Psychiatric/Behavioral: Negative for confusion.   A complete review of systems was obtained and all systems are negative except as noted in the HPI and PMH.   Physical Exam Updated Vital Signs BP 129/79 (BP Location: Left Arm)  Pulse 74   Temp 98.5 F (36.9 C) (Oral)   Resp 18   SpO2 98%   Physical Exam  Constitutional: She is oriented to person, place, and time. Vital signs are normal. She appears well-developed and well-nourished.  Non-toxic appearance. No distress.  Afebrile, nontoxic, NAD  HENT:  Head: Normocephalic and atraumatic.  Mouth/Throat: Mucous membranes are normal.  Eyes: Conjunctivae and EOM are normal. Right eye exhibits no discharge. Left eye exhibits no discharge.  Neck: Normal range of motion. Neck supple.  Cardiovascular: Normal rate and intact distal pulses.   Pulmonary/Chest: Effort normal. No respiratory distress.  Abdominal: Normal appearance. She exhibits no distension.  Musculoskeletal: Normal range of motion.       Right elbow: She exhibits swelling. She exhibits normal range of motion, no effusion, no deformity and no laceration. Tenderness found. Olecranon process tenderness noted.  Right elbow with FROM intact, with mild  TTP over the olecranon process, with large amount of swelling to the olecranon bursa which seems contained to the bursal sac and extending just slightly into triceps tendon region, no joint effusion noted. No overlying skin changes, no warmth. No crepitus or deformity, no wounds. Strength and sensation grossly intact, distal pulses intact, compartments soft.  Neurological: She is alert and oriented to person, place, and time. She has normal strength. No sensory deficit.  Skin: Skin is warm, dry and intact. No rash noted.  Psychiatric: She has a normal mood and affect. Her behavior is normal.  Nursing note and vitals reviewed.  ED Treatments / Results  DIAGNOSTIC STUDIES: Oxygen Saturation is 98% on RA, normal by my interpretation.   COORDINATION OF CARE: 5:25 PM-Discussed next steps with pt. Pt verbalized understanding and is agreeable with the plan.   Labs (all labs ordered are listed, but only abnormal results are displayed) Labs Reviewed - No data to display  EKG  EKG Interpretation None      Radiology Dg Elbow Complete Right  Result Date: 09/27/2016 CLINICAL DATA:  Swelling over olecranon. Symptoms began this morning. Fall 3 weeks ago. EXAM: RIGHT ELBOW - COMPLETE 3+ VIEW COMPARISON:  None. FINDINGS: Swelling over the olecranon is identified. No soft tissue gas is identified. No fracture is noted. No joint effusion. IMPRESSION: Soft tissue swelling over the olecranon is most consistent with bursitis. No fracture identified. Electronically Signed   By: Dorise Bullion III M.D   On: 09/27/2016 18:19    Procedures Procedures  Medications Ordered in ED Medications - No data to display  Initial Impression / Assessment and Plan / ED Course  I have reviewed the triage vital signs and the nursing notes.  Pertinent labs & imaging results that were available during my care of the patient were reviewed by me and considered in my medical decision making (see chart for details).      61 y.o. female here R elbow pain x3wks after mechanical fall and striking it on the ground, didn't think much of it, thought it was bruised, but then today while sweeping outside she noticed swelling over olecranon. On exam, mild TTP to olecranon process, no redness/warmth/bruising, no skin abrasions, but with moderate swelling to olecranon bursal sac, mostly contained in the bursa, no effusion noted. Swelling goes slightly into triceps tendon region. FROM intact. No evidence of gout or infection/septic joint. Given trauma, will xray, but likely olecranon bursitis from the injury that then got exacerbated by repetitive movements. Discussed that I do not feel we need to aspirate the area since it  will likely return, we don't want to introduce infection, and I'm not worried about septic joint/gout so aspiration will not change management. Will defer to ortho for any aspirations. Pt declined wanting anything for pain. Will reassess after xray imaging.   6:29 PM Xray neg for osseous injury, just shows swelling c/w bursitis which is clinically what it appears to be; Likely olecranon bursitis from repetitive movements and recent trauma. Will ace wrap for comfort/compression. Advised RICE/tylenol/motrin, f/up with ortho in 1wk for recheck and ongoing care. I explained the diagnosis and have given explicit precautions to return to the ER including for any other new or worsening symptoms. The patient understands and accepts the medical plan as it's been dictated and I have answered their questions. Discharge instructions concerning home care and prescriptions have been given. The patient is STABLE and is discharged to home in good condition.   I personally performed the services described in this documentation, which was scribed in my presence. The recorded information has been reviewed and is accurate.   Final Clinical Impressions(s) / ED Diagnoses   Final diagnoses:  Olecranon bursitis of right elbow  Right  elbow pain   New Prescriptions New Prescriptions   No medications on file      [address], PA-C 09/27/16 [place], MD 09/28/16 0000

## 2016-11-11 ENCOUNTER — Other Ambulatory Visit: Payer: Self-pay | Admitting: Family Medicine

## 2016-11-11 DIAGNOSIS — Z1231 Encounter for screening mammogram for malignant neoplasm of breast: Secondary | ICD-10-CM

## 2016-11-25 ENCOUNTER — Ambulatory Visit
Admission: RE | Admit: 2016-11-25 | Discharge: 2016-11-25 | Disposition: A | Discharge status: Home / Self Care | Payer: BC Managed Care – PPO | Source: Ambulatory Visit | Attending: Family Medicine | Admitting: Family Medicine

## 2016-11-25 DIAGNOSIS — Z1231 Encounter for screening mammogram for malignant neoplasm of breast: Secondary | ICD-10-CM

## 2017-02-25 ENCOUNTER — Ambulatory Visit: Payer: BC Managed Care – PPO | Admitting: Obstetrics and Gynecology

## 2017-02-25 ENCOUNTER — Encounter: Payer: Self-pay | Admitting: Obstetrics and Gynecology

## 2017-02-25 ENCOUNTER — Telehealth: Payer: Self-pay | Admitting: Obstetrics and Gynecology

## 2017-02-25 VITALS — BP 100/60 | HR 72 | Resp 16 | Ht 67.25 in | Wt 144.0 lb

## 2017-02-25 DIAGNOSIS — R0683 Snoring: Secondary | ICD-10-CM

## 2017-02-25 DIAGNOSIS — Z01419 Encounter for gynecological examination (general) (routine) without abnormal findings: Secondary | ICD-10-CM

## 2017-02-25 DIAGNOSIS — N951 Menopausal and female climacteric states: Secondary | ICD-10-CM

## 2017-02-25 DIAGNOSIS — N852 Hypertrophy of uterus: Secondary | ICD-10-CM

## 2017-02-25 DIAGNOSIS — Z30431 Encounter for routine checking of intrauterine contraceptive device: Secondary | ICD-10-CM | POA: Diagnosis not present

## 2017-02-25 NOTE — Patient Instructions (Signed)
Check on your bone density, tetanus and TSH  EXERCISE AND DIET:  We recommended that you start or continue a regular exercise program for good health. Regular exercise means any activity that makes your heart beat faster and makes you sweat.  We recommend exercising at least 30 minutes per day at least 3 days a week, preferably 4 or 5.  We also recommend a diet low in fat and sugar.  Inactivity, poor dietary choices and obesity can cause diabetes, heart attack, stroke, and kidney damage, among others.    ALCOHOL AND SMOKING:  Women should limit their alcohol intake to no more than 7 drinks/beers/glasses of wine (combined, not each!) per week. Moderation of alcohol intake to this level decreases your risk of breast cancer and liver damage. And of course, no recreational drugs are part of a healthy lifestyle.  And absolutely no smoking or even second hand smoke. Most people know smoking can cause heart and lung diseases, but did you know it also contributes to weakening of your bones? Aging of your skin?  Yellowing of your teeth and nails?  CALCIUM AND VITAMIN D:  Adequate intake of calcium and Vitamin D are recommended.  The recommendations for exact amounts of these supplements seem to change often, but generally speaking 600 mg of calcium (either carbonate or citrate) and 800 units of Vitamin D per day seems prudent. Certain women may benefit from higher intake of Vitamin D.  If you are among these women, your doctor will have told you during your visit.    PAP SMEARS:  Pap smears, to check for cervical cancer or precancers,  have traditionally been done yearly, although recent scientific advances have shown that most women can have pap smears less often.  However, every woman still should have a physical exam from her gynecologist every year. It will include a breast check, inspection of the vulva and vagina to check for abnormal growths or skin changes, a visual exam of the cervix, and then an exam to  evaluate the size and shape of the uterus and ovaries.  And after 60 years of age, a rectal exam is indicated to check for rectal cancers. We will also provide age appropriate advice regarding health maintenance, like when you should have certain vaccines, screening for sexually transmitted diseases, bone density testing, colonoscopy, mammograms, etc.   MAMMOGRAMS:  All women over 54 years old should have a yearly mammogram. Many facilities now offer a "3D" mammogram, which may cost around $50 extra out of pocket. If possible,  we recommend you accept the option to have the 3D mammogram performed.  It both reduces the number of women who will be called back for extra views which then turn out to be normal, and it is better than the routine mammogram at detecting truly abnormal areas.    COLONOSCOPY:  Colonoscopy to screen for colon cancer is recommended for all women at age 4.  We know, you hate the idea of the prep.  We agree, BUT, having colon cancer and not knowing it is worse!!  Colon cancer so often starts as a polyp that can be seen and removed at colonscopy, which can quite literally save your life!  And if your first colonoscopy is normal and you have no family history of colon cancer, most women don't have to have it again for 10 years.  Once every ten years, you can do something that may end up saving your life, right?  We will be happy to  help you get it scheduled when you are ready.  Be sure to check your insurance coverage so you understand how much it will cost.  It may be covered as a preventative service at no cost, but you should check your particular policy.

## 2017-02-25 NOTE — Telephone Encounter (Signed)
Patient returned call. Reviewed benefit for recommended ultrasound. Patient understood and agreeable. Patient ready to schedule. Patient scheduled 03/02/17 with Dr Talbert Nan. Patient aware of appointment date, arrival time and cancellation policy.  No further questions. Ok to close

## 2017-02-25 NOTE — Progress Notes (Signed)
61 y.o. G1P1 MarriedCaucasianF here for annual exam.  Patient states she had some spotting in January of this year, no bleeding since then. She has a mirena IUD that was placed in 2015 for menorrhagia (still with a pre-menopausal FSH at that time). She feels hot a lot of the time, but not having flashes. Occasionally wakes up feeling warm. Symptoms have been going on for a few years.  She see's Dr Loanne Drilling one time a year, h/o thyroid nodules.  No change in vaginal dryness, uses a lubricant with intercourse. No dyspareunia.  She is snoring, used to just be when she was on her back, now when she is on her side.     No LMP recorded. Patient is not currently having periods (Reason: IUD).          Sexually active: Yes.    The current method of family planning is IUD.    Exercising: Yes.    walking Smoker:  Former smoker  Health Maintenance: Pap: 01-24-15 WNL NEG HR HPV  History of abnormal Pap:  Yes in her teens  MMG:  11-25-16  WNL  Colonoscopy:  09-24-11 repeat in 10 yrs  BMD: osteopenia per patient, with her primary    TDaP:  Unsure, check with primary Gardasil: N/A   reports that she quit smoking about 29 years ago. She has never used smokeless tobacco. She reports that she drinks about 4.2 oz of alcohol per week . She reports that she does not use drugs.  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 Husband is in the Physics department. She has a 69 year old daughter. Working in [place] for a [employer]  Past Medical History:  Diagnosis Date  . Arthritis   . Asthma   . Blood transfusion    AS INFANT  . 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 Diverticulitis of colon with perforation 04/20/2011  . Heart murmur   . Medical history non-contributory   . Multinodular goiter   . Osteopenia   . Palpitations    CARDIAC STUDIES DONE 2009 - WAS TOLD THEY WERE NORMAL  . Personal history of colonic polyps 04/23/2011  . Rash, skin     AROUND STOMA  . Sjogren's syndrome [facility])     Past Surgical History:  Procedure Laterality Date  . COLON RESECTION  04/22/2011   Procedure: COLON RESECTION;  Surgeon: Adin Hector, MD;  Location: [facility];  Service: General;  Laterality: N/A;  colon resection, drainage of abscess  . COLON SURGERY  04/2011,07/2011   colostomy take down  . COLOSTOMY  04/22/2011   Procedure: COLOSTOMY;  Surgeon: Adin Hector, MD;  Location: [facility];  Service: General;  Laterality: N/A;  . DILATATION & CURETTAGE/HYSTEROSCOPY WITH TRUECLEAR N/A 12/19/2013   Procedure: DILATATION & CURETTAGE/HYSTEROSCOPY WITH TRUCLEAR;  Surgeon: Azalia Bilis, MD;  Location: [place] [facility];  Service: Gynecology;  Laterality: N/A;  . FOREIGN REMOVED  AGE 50   PEANUT REMOVED FROM LUNG  . HERNIA REPAIR    . KNEE MASS EXCISION  1970'S   OSTEOCHONDROMA REMOVED  . myoma on right thigh  2002 (summer)  . PROCTOSCOPY  08/14/2011   Procedure: PROCTOSCOPY;  Surgeon: Adin Hector, MD;  Location: [facility];  Service: General;  Laterality: N/A;  rigid    Current Outpatient Prescriptions  Medication Sig Dispense Refill  . Cholecalciferol (VITAMIN D) 2000 UNITS tablet Take 1,000 Units by mouth daily.     [name]  cycloSPORINE (RESTASIS) 0.05 % ophthalmic emulsion Place 1 drop into both eyes 2 (two) times daily.      . fluticasone (FLONASE) 50 MCG/ACT nasal spray Place 2 sprays into both nostrils daily.    . methylcellulose (ARTIFICIAL TEARS) 1 % ophthalmic solution Place 1 drop into both eyes daily.    . Multiple Vitamins-Minerals (MULTIVITAMINS THER. W/MINERALS) TABS Take 1 tablet by mouth daily.      . naproxen sodium (ANAPROX) 220 MG tablet Take 220-440 mg by mouth once as needed. For pain    . psyllium (METAMUCIL) 58.6 % packet Take 1 packet by mouth daily.     No current facility-administered medications for this visit.     Family History  Problem Relation Age of Onset  . Hyperlipidemia Mother   . Hyperlipidemia Father   . Parkinson's  disease Father   . Hyperlipidemia Sister   . Hyperlipidemia Brother   . Thyroid disease Neg Hx   . Breast cancer Neg Hx     Review of Systems  Constitutional: Negative.   HENT: Negative.   Eyes: Negative.   Respiratory: Negative.   Cardiovascular: Negative.   Gastrointestinal: Negative.   Endocrine: Negative.   Genitourinary: Negative.   Musculoskeletal: Negative.   Skin: Negative.   Allergic/Immunologic: Negative.   Neurological: Negative.   Psychiatric/Behavioral: Negative.     Exam:   BP 100/60 (BP Location: Right Arm, Patient Position: Sitting, Cuff Size: Normal)   Pulse 72   Resp 16   Ht 5' 7.25" (1.708 m)   Wt 144 lb (65.3 kg)   BMI 22.39 kg/m   Weight change: @WEIGHTCHANGE @ Height:   Height: 5' 7.25" (170.8 cm)  Ht Readings from Last 3 Encounters:  02/25/17 5' 7.25" (1.708 m)  09/27/16 5\' 7"  (1.702 m)  02/19/16 5\' 7"  (1.702 m)    General appearance: alert, cooperative and appears stated age Head: Normocephalic, without obvious abnormality, atraumatic Neck: no adenopathy, supple, symmetrical, trachea midline and thyroid normal to inspection and palpation Lungs: clear to auscultation bilaterally Cardiovascular: regular rate and rhythm Breasts: normal appearance, no masses or tenderness Abdomen: soft, non-tender; non distended,  no masses,  no organomegaly Extremities: extremities normal, atraumatic, no cyanosis or edema Skin: Skin color, texture, turgor normal. No rashes or lesions Lymph nodes: Cervical, supraclavicular, and axillary nodes normal. No abnormal inguinal nodes palpated Neurologic: Grossly normal   Pelvic: External genitalia:  no lesions              Urethra:  normal appearing urethra with no masses, tenderness or lesions              Bartholins and Skenes: normal                 Vagina: normal appearing vagina with normal color and discharge, no lesions              Cervix: no lesions               Bimanual Exam:  Uterus:  pelvic mass felt,  hard to tell if it is an enlarged retroverted uterus vs a mass posterior to the uterus, firm, mobile, smooth. 8+ week sized.               Adnexa: no mass, fullness, tenderness               Rectovaginal: Confirms               Anus:  normal sphincter tone, no lesions  Chaperone  was present for exam.  A:  Well Woman exam with enlarged uterus vs adnexal mass  IUD check  She was having menorrhagia with regular cycles 3 years ago  Some mild vasomotor symptoms  Snoring  P:   Pap next year  Mammogram and colonoscopy are UTD  Discussed breast self exam  Discussed calcium and vit D intake  She will check on her TDAP and DEXA  [facility]  Return for a gyn ultrasound  Will refer for a sleep study

## 2017-02-25 NOTE — Telephone Encounter (Signed)
Call placed to patient to review benefits and schedule recommended ultrasound. Left voicemail message requesting a return call °

## 2017-02-26 LAB — FSH/LH
FSH: 94.6 m[IU]/mL
LH: 56.6 m[IU]/mL

## 2017-03-02 ENCOUNTER — Encounter: Payer: Self-pay | Admitting: Obstetrics and Gynecology

## 2017-03-02 ENCOUNTER — Ambulatory Visit (INDEPENDENT_AMBULATORY_CARE_PROVIDER_SITE_OTHER): Payer: BC Managed Care – PPO | Admitting: Obstetrics and Gynecology

## 2017-03-02 ENCOUNTER — Ambulatory Visit (INDEPENDENT_AMBULATORY_CARE_PROVIDER_SITE_OTHER): Payer: BC Managed Care – PPO

## 2017-03-02 VITALS — BP 100/60 | HR 80 | Resp 14 | Wt 144.0 lb

## 2017-03-02 DIAGNOSIS — Z30431 Encounter for routine checking of intrauterine contraceptive device: Secondary | ICD-10-CM | POA: Diagnosis not present

## 2017-03-02 DIAGNOSIS — D259 Leiomyoma of uterus, unspecified: Secondary | ICD-10-CM

## 2017-03-02 DIAGNOSIS — N852 Hypertrophy of uterus: Secondary | ICD-10-CM | POA: Diagnosis not present

## 2017-03-02 DIAGNOSIS — N951 Menopausal and female climacteric states: Secondary | ICD-10-CM

## 2017-03-02 NOTE — Progress Notes (Signed)
GYNECOLOGY  VISIT   HPI: 61 y.o.   Married  Caucasian  female   G1P1 with No LMP recorded. Patient is not currently having periods (Reason: IUD).   here for follow up IUD check and mass felt on exam. The patient has a known fibroid uterus, at her last exam her fibroid felt larger than previously. She had a mirena IUD inserted in 2015 for menorrhagia. Last spotting was in 1/18. Recent PMP [place].   GYNECOLOGIC HISTORY: No LMP recorded. Patient is not currently having periods (Reason: IUD). Contraception:IUD Menopausal hormone therapy: none         OB History    Gravida Para Term Preterm AB Living   1 1       1    SAB TAB Ectopic Multiple Live Births                     Patient Active Problem List   Diagnosis Date Noted  . Multinodular goiter 02/19/2015  . IUD (intrauterine device) in place 01/29/2014  . Fibroid, uterine 11/24/2013  . Menorrhagia, premenopausal 11/24/2013  . Personal history of colonic polyps 04/23/2011  . Diverticulitis of colon with perforation 04/20/2011  . Sjoegren syndrome ([place]) 04/17/2011  . Asthma 04/17/2011    Past Medical History:  Diagnosis Date  . Arthritis   . Asthma   . Blood transfusion    AS INFANT  . 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 Diverticulitis of colon with perforation 04/20/2011  . Heart murmur   . Medical history non-contributory   . Multinodular goiter   . Osteopenia   . Palpitations    CARDIAC STUDIES DONE 2009 - WAS TOLD THEY WERE NORMAL  . Personal history of colonic polyps 04/23/2011  . Rash, skin    AROUND STOMA  . Sjogren's syndrome [facility])     Past Surgical History:  Procedure Laterality Date  . COLON RESECTION  04/22/2011   Procedure: COLON RESECTION;  Surgeon: Adin Hector, MD;  [facility];  Service: General;  Laterality: N/A;  colon resection, drainage of abscess  . COLON SURGERY  04/2011,07/2011   colostomy take down  . COLOSTOMY  04/22/2011   Procedure: COLOSTOMY;  Surgeon: Adin Hector, MD;   [facility]: [facility];  Service: General;  Laterality: N/A;  . DILATATION & CURETTAGE/HYSTEROSCOPY WITH TRUECLEAR N/A 12/19/2013   Procedure: DILATATION & CURETTAGE/HYSTEROSCOPY WITH TRUCLEAR;  Surgeon: Azalia Bilis, MD;  Location: [place] [facility];  Service: Gynecology;  Laterality: N/A;  . FOREIGN REMOVED  AGE 19   PEANUT REMOVED FROM LUNG  . HERNIA REPAIR    . KNEE MASS EXCISION  1970'S   OSTEOCHONDROMA REMOVED  . myoma on right thigh  2002 (summer)  . PROCTOSCOPY  08/14/2011   Procedure: PROCTOSCOPY;  Surgeon: Adin Hector, MD;  [facility];  Service: General;  Laterality: N/A;  rigid    Current Outpatient Prescriptions  Medication Sig Dispense Refill  . Cholecalciferol (VITAMIN D) 2000 UNITS tablet Take 1,000 Units by mouth daily.     . cycloSPORINE (RESTASIS) 0.05 % ophthalmic emulsion Place 1 drop into both eyes 2 (two) times daily.      . fluticasone (FLONASE) 50 MCG/ACT nasal spray Place 2 sprays into both nostrils daily.    [name] levonorgestrel (MIRENA) 20 MCG/24HR IUD 1 each by Intrauterine route once.    . methylcellulose (ARTIFICIAL TEARS) 1 % ophthalmic solution Place 1 drop into both eyes daily.    . Multiple Vitamins-Minerals (MULTIVITAMINS  THER. W/MINERALS) TABS Take 1 tablet by mouth daily.      . naproxen sodium (ANAPROX) 220 MG tablet Take 220-440 mg by mouth once as needed. For pain    . psyllium (METAMUCIL) 58.6 % packet Take 1 packet by mouth daily.     No current facility-administered medications for this visit.      ALLERGIES: Clindamycin; Codeine; and Polyethyl glycol-propyl glycol  Family History  Problem Relation Age of Onset  . Hyperlipidemia Mother   . Hyperlipidemia Father   . Parkinson's disease Father   . Hyperlipidemia Sister   . Hyperlipidemia Brother   . Thyroid disease Neg Hx   . Breast cancer Neg Hx     Social History   Social History  . Marital status: Married    Spouse name: N/A  . Number of children: N/A  . Years of education: N/A    Occupational History  . Not on file.   Social History Main Topics  . Smoking status: Former Smoker    Quit date: 08/04/1987  . Smokeless tobacco: Never Used  . Alcohol use 4.2 oz/week    7 Glasses of wine per week     Comment: moderate, less than one per day  . Drug use: No  . Sexual activity: Yes    Partners: Male    Birth control/ protection: IUD   Other Topics Concern  . Not on file   Social History Narrative  . No narrative on file    Review of Systems  Constitutional: Negative.   HENT: Negative.   Eyes: Negative.   Respiratory: Negative.   Cardiovascular: Negative.   Gastrointestinal: Negative.   Genitourinary: Negative.   Musculoskeletal: Negative.   Skin: Negative.   Neurological: Negative.   Endo/Heme/Allergies: Negative.   Psychiatric/Behavioral: Negative.     PHYSICAL EXAMINATION:    BP 100/60 (BP Location: Right Arm, Patient Position: Sitting, Cuff Size: Normal)   Pulse 80   Resp 14   Wt 144 lb (65.3 kg)   BMI 22.39 kg/m     General appearance: alert, cooperative and appears stated age  Ultrasound images reviewed with the patient, she has small 4 myomas compared to 1 in 2015 (all the myomas smaller than the one in 2015). IUD is in the cavity, but is upside down  ASSESSMENT Pelvic mass, fibroid uterus on u/s, 4 small myomas, normal adnexa IUD in the cavity, but malpositioned, no pain issues Last episode of spotting was in 1/18. Recent PMP [place]    PLAN Discussed the option of pulling the IUD now, or waiting until her check up next year (if no further bleeding) After discussion will wait to pull the IUD at her annual next September   An After Visit Summary was printed and given to the patient.

## 2017-03-29 ENCOUNTER — Telehealth: Payer: Self-pay | Admitting: Obstetrics and Gynecology

## 2017-03-29 NOTE — Telephone Encounter (Signed)
Please disregard made in error °

## 2017-03-30 ENCOUNTER — Other Ambulatory Visit: Payer: Self-pay | Admitting: *Deleted

## 2017-03-31 ENCOUNTER — Telehealth: Payer: Self-pay

## 2017-03-31 DIAGNOSIS — R0683 Snoring: Secondary | ICD-10-CM

## 2017-03-31 NOTE — Telephone Encounter (Signed)
Salvadore Dom, MD  [place], [name], RN        Please set her up with the MD who can order her CPAP.  Thanks!!   Previous Messages    ----- Message -----  From: Gwendlyn Deutscher, RN  Sent: 03/29/2017 10:52 AM  To: [name], MD  Subject: [name]: referral order?                I spoke with the sleep study tech at [facility]. You can order a sleep study directly and it will be read by a provider, usually Dr. Baird Lyons, but if patient needs CPAP machine or any other medical supplies our office would need to co-ordinate this. Otherwise, the patient would need to have a referral to [facility] and they see the provider and they will order sleep study (as appropriate) and coordinate with medical supplies. Please advise.  ----- Message -----  From: Lucienne Minks  Sent: 03/29/2017  9:40 AM  To: Burnice Logan, RN, Gwendlyn Deutscher, RN  Subject: referral order?                  There is a sleep medicine referral in the work que for this patient. The department is correct which is [facility]. I called to check the status of the referral and was told there is no order. An order has to be put in so they can call the patient. Can you get an order? Phone # [phone]   [facility]      Referral placed to [facility] to see first available provider for evaluation of snoring. [place] will order necessary testings and medical supplies as needed.  Cc: Magdalene Patricia for scheduling referral  Routing to provider for final review. Patient agreeable to disposition. Will close encounter.

## 2017-04-27 ENCOUNTER — Telehealth: Payer: Self-pay | Admitting: Obstetrics and Gynecology

## 2017-04-27 NOTE — Telephone Encounter (Signed)
Opened in error

## 2017-05-19 ENCOUNTER — Encounter: Payer: Self-pay | Admitting: Pulmonary Disease

## 2017-05-19 ENCOUNTER — Ambulatory Visit: Payer: BC Managed Care – PPO | Admitting: Pulmonary Disease

## 2017-05-19 VITALS — BP 124/78 | HR 67 | Ht 67.0 in | Wt 141.8 lb

## 2017-05-19 DIAGNOSIS — R29818 Other symptoms and signs involving the nervous system: Secondary | ICD-10-CM

## 2017-05-19 NOTE — Patient Instructions (Signed)
Will arrange for home sleep study Will call to arrange for follow up after sleep study reviewed  

## 2017-05-19 NOTE — Progress Notes (Signed)
   Subjective:    Patient ID: Annette Gentry, female    DOB: 06/30/1955, 61 y.o.   MRN: [id number]  HPI    Review of Systems  Constitutional: Negative for fever and unexpected weight change.  HENT: Positive for congestion, nosebleeds, postnasal drip, sinus pressure and sneezing. Negative for dental problem, ear pain, rhinorrhea, sore throat and trouble swallowing.   Eyes: Positive for redness and itching.  Respiratory: Positive for cough, shortness of breath and wheezing. Negative for chest tightness.   Cardiovascular: Negative for palpitations and leg swelling.  Gastrointestinal: Negative for nausea and vomiting.  Genitourinary: Negative for dysuria.  Musculoskeletal: Positive for joint swelling.  Skin: Negative for rash.  Allergic/Immunologic: Positive for environmental allergies. Negative for food allergies and immunocompromised state.  Neurological: Positive for light-headedness and headaches.  Hematological: Does not bruise/bleed easily.  Psychiatric/Behavioral: Negative for dysphoric mood. The patient is not nervous/anxious.        Objective:   Physical Exam        Assessment & Plan:

## 2017-05-19 NOTE — Progress Notes (Signed)
[facility], Critical Care, and Sleep Medicine  Chief Complaint  Patient presents with  . sleep consult    Pt referred by Dr. Sumner Boast MD. Pt sleeps 7-8 hours each night, pt snores loudly. Pt has a mouth appliance, but feels it is not working well.    Vital signs: BP 124/78 (BP Location: Left Arm, Cuff Size: Normal)   Pulse 67   Ht 5\' 7"  (1.702 m)   Wt 141 lb 12.8 oz (64.3 kg)   SpO2 99%   BMI 22.21 kg/m   History of Present Illness: Annette Gentry is a 61 y.o. female for evaluation of sleep problems.  Her husband has been concerned about her snoring.  He says she gets shallow breathing at times also.  This was better when she slept on her side, but not she is having trouble on her side also.  She as seen by Dr. Constance Holster with ENT.  He recommend an oral appliance.  She got this from her dentist.  She still snores some event with mouth piece.  She is also starting to get clicking in her jaw since using mouth piece.  She goes to sleep at 1030 pm.  She falls asleep after few minutes.  She wakes up some times to use the bathroom.  Usually she wakes up because of joint pains.  She gets out of bed at 6 am.  She feels okay in the morning.  She denies morning headache.  She does not use anything to help heror stay awake.  She sometimes takes advil PM to help her sleep, but this is more to help with arthritis pain.  She denies sleep walking, sleep talking, bruxism, or nightmares.  There is no history of restless legs.  She denies sleep hallucinations, sleep paralysis, or cataplexy.  The Epworth score is 9 out of 24.    Physical Exam:  General - pleasant Eyes - pupils reactive, wears glasses ENT - no sinus tenderness, no oral exudate, no LAN, MP 2, decreased AP diameter with oropharyngeal collapse Cardiac - regular, no murmur Chest - no wheeze, rales Abd - soft, non tender Ext - no edema Skin - no rashes Neuro - normal strength Psych - normal mood  Discussion: She has snoring,  sleep disruption and daytime sleepiness.  She has also been told that she has shallow breathing at night.  It is possible she could have sleep apnea.  She has tried oral appliance, but still has snoring and is starting to get TMJ discomfort.  We discussed how sleep apnea can affect various health problems, including risks for hypertension, cardiovascular disease, and diabetes.  We also discussed how sleep disruption can increase risks for accidents, such as while driving.  Weight loss as a means of improving sleep apnea was also reviewed.  Additional treatment options discussed were CPAP therapy, oral appliance, and surgical intervention.  Assessment/Plan:  Snoring with suspected sleep apnea. - discussed different options to assist with positional therapy - advised her to d/w her dentist about her issues with mouth piece - she would like to have a sleep study to determine if she has sleep apnea and whether CPAP might be a better option - explained that if her issue is only snoring causing sleep disruption for her husband, then sometimes sleeping in separate rooms might be best option   Patient Instructions  Will arrange for home sleep study Will call to arrange for follow up after sleep study reviewed     Chesley Mires, MD  [place] 05/19/2017, 4:01 PM Pager:  [phone]  Flow Sheet  Pulmonary tests:  Sleep tests:  Cardiac tests:  Review of Systems: Constitutional: Negative for fever and unexpected weight change.  HENT: Positive for congestion, nosebleeds, postnasal drip, sinus pressure and sneezing. Negative for dental problem, ear pain, rhinorrhea, sore throat and trouble swallowing.   Eyes: Positive for redness and itching.  Respiratory: Positive for cough, shortness of breath and wheezing. Negative for chest tightness.   Cardiovascular: Negative for palpitations and leg swelling.  Gastrointestinal: Negative for nausea and vomiting.  Genitourinary:  Negative for dysuria.  Musculoskeletal: Positive for joint swelling.  Skin: Negative for rash.  Allergic/Immunologic: Positive for environmental allergies. Negative for food allergies and immunocompromised state.  Neurological: Positive for light-headedness and headaches.  Hematological: Does not bruise/bleed easily.  Psychiatric/Behavioral: Negative for dysphoric mood. The patient is not nervous/anxious.    Past Medical History: She  has a past medical history of Arthritis, Asthma, Blood transfusion, Colostomy in place [facility]) (04/2011), Diverticulitis of colon with perforation (04/20/2011), Heart murmur, Medical history non-contributory, Multinodular goiter, Osteopenia, Palpitations, Personal history of colonic polyps (04/23/2011), Rash, skin, and Sjogren's syndrome ([place]).  Past Surgical History: She  has a past surgical history that includes Hernia repair; Colon resection (04/22/2011); Colostomy (04/22/2011); myoma on right thigh (2002 (summer)); Knee mass excision (1970'S); FOREIGN REMOVED (AGE 44); Proctoscopy (08/14/2011); Colon surgery (04/2011,07/2011); and Dilatation & curettage/hysteroscopy with trueclear (N/A, 12/19/2013).  Family History: Her family history includes Hyperlipidemia in her brother, father, mother, and sister; Parkinson's disease in her father.  Social History: She  reports that she quit smoking about 29 years ago. she has never used smokeless tobacco. She reports that she drinks about 4.2 oz of alcohol per week. She reports that she does not use drugs.  Medications: Allergies as of 05/19/2017      Reactions   Clindamycin Hives   Codeine Nausea Only   Polyethyl Glycol-propyl Glycol Other (See Comments)   Itching and redness      Medication List        Accurate as of 05/19/17  4:01 PM. Always use your most recent med list.          cycloSPORINE 0.05 % ophthalmic emulsion Commonly known as:  RESTASIS Place 1 drop into both eyes 2 (two) times daily.     fluticasone 50 MCG/ACT nasal spray Commonly known as:  FLONASE Place 2 sprays into both nostrils daily.   levonorgestrel 20 MCG/24HR IUD Commonly known as:  MIRENA 1 each by Intrauterine route once.   methylcellulose 1 % ophthalmic solution Commonly known as:  ARTIFICIAL TEARS Place 1 drop into both eyes daily.   multivitamins ther. w/minerals Tabs tablet Take 1 tablet by mouth daily.   psyllium 58.6 % packet Commonly known as:  METAMUCIL Take 1 packet by mouth daily.   Vitamin D 2000 units tablet Take 1,000 Units by mouth daily.

## 2017-05-21 ENCOUNTER — Telehealth: Payer: Self-pay | Admitting: Pulmonary Disease

## 2017-05-21 DIAGNOSIS — G4733 Obstructive sleep apnea (adult) (pediatric): Secondary | ICD-10-CM

## 2017-05-21 NOTE — Telephone Encounter (Signed)
rec'd a staff message from [place] and [place] regarding patient is not able to have HST due to insurance.  Libby let pt know his insurance won't cover home sleep study, and he would need to do in lab study. If he is okay with this, then please send order for split night sleep study.   Patient is  Fine having the inlab sleep study, placed order today.

## 2017-05-21 NOTE — Telephone Encounter (Signed)
rec'd a staff message from [place] and [place] regarding patient is not able to have HST due to insurance.  Libby let pt know his insurance won't cover home sleep study, and he would need to do in lab study. If he is okay with this, then please send order for split night sleep study.   Patient is  Fine having the inlab sleep study, placed order today.

## 2017-06-20 ENCOUNTER — Encounter (HOSPITAL_BASED_OUTPATIENT_CLINIC_OR_DEPARTMENT_OTHER): Payer: BC Managed Care – PPO

## 2017-12-30 ENCOUNTER — Other Ambulatory Visit: Payer: Self-pay | Admitting: Family Medicine

## 2017-12-30 DIAGNOSIS — Z1231 Encounter for screening mammogram for malignant neoplasm of breast: Secondary | ICD-10-CM

## 2018-01-25 ENCOUNTER — Ambulatory Visit
Admission: RE | Admit: 2018-01-25 | Discharge: 2018-01-25 | Disposition: A | Discharge status: Home / Self Care | Payer: BC Managed Care – PPO | Source: Ambulatory Visit | Attending: Family Medicine | Admitting: Family Medicine

## 2018-01-25 DIAGNOSIS — Z1231 Encounter for screening mammogram for malignant neoplasm of breast: Secondary | ICD-10-CM

## 2018-02-11 ENCOUNTER — Encounter: Payer: Self-pay | Admitting: Endocrinology

## 2018-02-11 ENCOUNTER — Ambulatory Visit: Payer: BC Managed Care – PPO | Admitting: Endocrinology

## 2018-02-11 VITALS — BP 102/58 | HR 66 | Ht 67.0 in | Wt 144.0 lb

## 2018-02-11 DIAGNOSIS — E042 Nontoxic multinodular goiter: Secondary | ICD-10-CM | POA: Diagnosis not present

## 2018-02-11 LAB — TSH: TSH: 1.38 u[IU]/mL (ref 0.35–4.50)

## 2018-02-11 NOTE — Patient Instructions (Addendum)
A thyroid blood test is requested for you today.  We'll let you know about the results.  Please come back for a follow-up appointment in 1-2 years. most of the time, a "lumpy thyroid" will eventually become overactive.  this is usually a slow process, happening over the span of many years.

## 2018-02-11 NOTE — Progress Notes (Signed)
Subjective:    Patient ID: Annette Gentry, female    DOB: [birth year], 62 y.o.   MRN: [id number]  HPI Pt returns for f/u of multinodular goiter (dx'ed 2013, when she had aspiration of cystic fluid (cytol: no diagnostic cells); f/u US in 2016 showed bilateral thyroid nodules with a dominant right thyroid cyst. The dominant right thyroid cyst has enlarged; pt declined bx then; f/u US in 2017 was slightly better).  pt states she feels well in general.  In particular, she says the cyst is smaller Past Medical History:  Diagnosis Date  . Arthritis   . Asthma   . Blood transfusion    AS INFANT  . 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 Diverticulitis of colon with perforation 04/20/2011  . Heart murmur   . Medical history non-contributory   . Multinodular goiter   . Osteopenia   . Palpitations    CARDIAC STUDIES DONE 2009 - WAS TOLD THEY WERE NORMAL  . Personal history of colonic polyps 04/23/2011  . Rash, skin    AROUND STOMA  . Sjogren's syndrome [facility])     Past Surgical History:  Procedure Laterality Date  . COLON RESECTION  04/22/2011   Procedure: COLON RESECTION;  Surgeon: Adin Hector, MD;  [facility];  Service: General;  Laterality: N/A;  colon resection, drainage of abscess  . COLON SURGERY  04/2011,07/2011   colostomy take down  . COLOSTOMY  04/22/2011   Procedure: COLOSTOMY;  Surgeon: Adin Hector, MD;  [facility];  Service: General;  Laterality: N/A;  . DILATATION & CURETTAGE/HYSTEROSCOPY WITH TRUECLEAR N/A 12/19/2013   Procedure: DILATATION & CURETTAGE/HYSTEROSCOPY WITH TRUCLEAR;  Surgeon: Azalia Bilis, MD;  Location: [place] [facility];  Service: Gynecology;  Laterality: N/A;  . FOREIGN REMOVED  AGE 48   PEANUT REMOVED FROM LUNG  . HERNIA REPAIR    . KNEE MASS EXCISION  1970'S   OSTEOCHONDROMA REMOVED  . myoma on right thigh  2002 (summer)  . PROCTOSCOPY  08/14/2011   Procedure: PROCTOSCOPY;  Surgeon: Adin Hector, MD;  [facility];  Service: General;   Laterality: N/A;  rigid    Social History   Socioeconomic History  . Marital status: Married    Spouse name: Not on file  . Number of children: Not on file  . Years of education: Not on file  . Highest education level: Not on file  Occupational History  . Not on file  Social Needs  . Financial resource strain: Not on file  . Food insecurity:    Worry: Not on file    Inability: Not on file  . Transportation needs:    Medical: Not on file    Non-medical: Not on file  Tobacco Use  . Smoking status: Former Smoker    Last attempt to quit: 08/04/1987    Years since quitting: 30.5  . Smokeless tobacco: Never Used  Substance and Sexual Activity  . Alcohol use: Yes    Alcohol/week: 7.0 standard drinks    Types: 7 Glasses of wine per week    Comment: moderate, less than one per day  . Drug use: No  . Sexual activity: Yes    Partners: Male    Birth control/protection: IUD  Lifestyle  . Physical activity:    Days per week: Not on file    Minutes per session: Not on file  . Stress: Not on file  Relationships  . Social connections:    Talks on phone:  Not on file    Gets together: Not on file    Attends religious service: Not on file    Active member of club or organization: Not on file    Attends meetings of clubs or organizations: Not on file    Relationship status: Not on file  . Intimate partner violence:    Fear of current or ex partner: Not on file    Emotionally abused: Not on file    Physically abused: Not on file    Forced sexual activity: Not on file  Other Topics Concern  . Not on file  Social History Narrative  . Not on file    Current Outpatient Medications on File Prior to Visit  Medication Sig Dispense Refill  . Cholecalciferol (VITAMIN D) 2000 UNITS tablet Take 1,000 Units by mouth daily.     . fluticasone (FLONASE) 50 MCG/ACT nasal spray Place 2 sprays into both nostrils daily.    [name] levonorgestrel (MIRENA) 20 MCG/24HR IUD 1 each by Intrauterine route  once.    . methylcellulose (ARTIFICIAL TEARS) 1 % ophthalmic solution Place 1 drop into both eyes daily.    . Multiple Vitamins-Minerals (MULTIVITAMINS THER. W/MINERALS) TABS Take 1 tablet by mouth daily.      . psyllium (METAMUCIL) 58.6 % packet Take 1 packet by mouth daily.     No current facility-administered medications on file prior to visit.     Allergies  Allergen Reactions  . Clindamycin Hives  . Codeine Nausea Only  . Polyethyl Glycol-Propyl Glycol Other (See Comments)    Itching and redness     Family History  Problem Relation Age of Onset  . Hyperlipidemia Mother   . Hyperlipidemia Father   . Parkinson's disease Father   . Hyperlipidemia Sister   . Hyperlipidemia Brother   . Thyroid disease Neg Hx   . Breast cancer Neg Hx     BP (!) 102/58 (BP Location: Right Arm)   Pulse 66   Ht 5\' 7"  (1.702 m)   Wt 144 lb (65.3 kg)   SpO2 97%   BMI 22.55 kg/m    Review of Systems Denies neck pain    Objective:   Physical Exam VITAL SIGNS:  See vs page GENERAL: no distress NECK: thyroid is slightly enlarged, with irreg surface, but I can't tell details.        Assessment & Plan:  Thyroid cyst: improved.  Sjogren's syndrome: in this setting, chronic thyroiditis may be the cause for thyroid nodules, so she is at risk for hypothyroidism.  Patient Instructions  A thyroid blood test is requested for you today.  We'll let you know about the results.  Please come back for a follow-up appointment in 1-2 years. most of the time, a "lumpy thyroid" will eventually become overactive.  this is usually a slow process, happening over the span of many years.

## 2018-02-17 ENCOUNTER — Ambulatory Visit: Payer: BC Managed Care – PPO | Admitting: Endocrinology

## 2018-03-07 NOTE — Progress Notes (Signed)
62 y.o. G1P1 Married White or Caucasian Not Hispanic or Latino female here for annual exam.  She has a mirena IUD that was placed in 2015 for menorrhagia (still with pre-menopausal FSH at that time).  U/S last year for spotting in 1/18, IUD in cavity but upside down, multiple myomas, PMP FSH. She wanted to wait until this year to pull her IUD.  She had a minimal amount of spotting last January.  No significant vasomotor symptoms. Some vaginal dryness, intercourse is  fine when she uses lubrication.  She has noticed an intermittent lump near her left hip, can't always feel it. Not tender. One time she felt it after bowel prep for a colonoscopy. Has noticed it since at least 2013.   Followed by Dr Loanne Drilling for thyroid nodules.     No LMP recorded. (Menstrual status: IUD).          Sexually active: Yes.    The current method of family planning is IUD.    Exercising: Yes.    walking, stretching Smoker:  no  Health Maintenance: Pap: 01-24-15 WNL NEG HR HPV  History of abnormal Pap:  Yes in her teens  MMG: 01/25/2018 Birads 1 negative Colonoscopy:  09-24-11 repeat in 10 yrs  BMD: osteopenia per patient, with her primary    TDaP:  Unsure, patient will check with PCP Gardasil: N/A    reports that she quit smoking about 30 years ago. She has never used smokeless tobacco. She reports that she drinks about 7.0 standard drinks of alcohol per week. She reports that she does not use drugs. 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 Husband is in the Physics department. She has a 87 year old daughter. 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 Arthritis   . Asthma   . Blood transfusion    AS INFANT  . 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 Diverticulitis of colon with perforation 04/20/2011  . Heart murmur   . Medical history non-contributory   . Multinodular goiter   . Osteopenia   .  Palpitations    CARDIAC STUDIES DONE 2009 - WAS TOLD THEY WERE NORMAL  . Personal history of colonic polyps 04/23/2011  . Rash, skin    AROUND STOMA  . Sjogren's syndrome [facility])     Past Surgical History:  Procedure Laterality Date  . COLON RESECTION  04/22/2011   Procedure: COLON RESECTION;  Surgeon: Adin Hector, MD;  Location: [facility];  Service: General;  Laterality: N/A;  colon resection, drainage of abscess  . COLON SURGERY  04/2011,07/2011   colostomy take down  . COLOSTOMY  04/22/2011   Procedure: COLOSTOMY;  Surgeon: Adin Hector, MD;  [facility]: [facility];  Service: General;  Laterality: N/A;  . DILATATION & CURETTAGE/HYSTEROSCOPY WITH TRUECLEAR N/A 12/19/2013   Procedure: DILATATION & CURETTAGE/HYSTEROSCOPY WITH TRUCLEAR;  Surgeon: Azalia Bilis, MD;  Location: [place] [facility];  Service: Gynecology;  Laterality: N/A;  . FOREIGN REMOVED  AGE 38   PEANUT REMOVED FROM LUNG  . HERNIA REPAIR    . KNEE MASS EXCISION  1970'S   OSTEOCHONDROMA REMOVED  . myoma on right thigh  2002 (summer)  . PROCTOSCOPY  08/14/2011   Procedure: PROCTOSCOPY;  Surgeon: Adin Hector, MD;  [facility];  Service: General;  Laterality: N/A;  rigid    Current Outpatient Medications  Medication Sig Dispense  Refill  . Cholecalciferol (VITAMIN D) 2000 UNITS tablet Take 1,000 Units by mouth daily.     . fluticasone (FLONASE) 50 MCG/ACT nasal spray Place 2 sprays into both nostrils daily.    [name] levonorgestrel (MIRENA) 20 MCG/24HR IUD 1 each by Intrauterine route once.    . methylcellulose (ARTIFICIAL TEARS) 1 % ophthalmic solution Place 1 drop into both eyes daily.    . Multiple Vitamins-Minerals (MULTIVITAMINS THER. W/MINERALS) TABS Take 1 tablet by mouth daily.      . psyllium (METAMUCIL) 58.6 % packet Take 1 packet by mouth daily.     No current facility-administered medications for this visit.     Family History  Problem Relation Age of Onset  . Hyperlipidemia Mother   . Hyperlipidemia Father   .  Parkinson's disease Father   . Hyperlipidemia Sister   . Hyperlipidemia Brother   . Thyroid disease Neg Hx   . Breast cancer Neg Hx     Review of Systems  Constitutional: Negative.   HENT: Negative.   Eyes: Negative.   Respiratory: Negative.   Cardiovascular: Negative.   Gastrointestinal:       Abdominal lump  Endocrine: Negative.   Genitourinary: Negative.   Musculoskeletal: Negative.   Skin: Negative.   Allergic/Immunologic: Negative.   Neurological: Negative.   Hematological: Negative.   Psychiatric/Behavioral: Negative.     Exam:   BP 108/68 (BP Location: Right Arm, Patient Position: Sitting, Cuff Size: Normal)   Pulse 80   Ht 5\' 7"  (1.702 m)   Wt 141 lb 12.8 oz (64.3 kg)   BMI 22.21 kg/m   Weight change: @WEIGHTCHANGE @ Height:   Height: 5\' 7"  (170.2 cm)  Ht Readings from Last 3 Encounters:  03/10/18 5\' 7"  (1.702 m)  02/11/18 5\' 7"  (1.702 m)  05/19/17 5\' 7"  (1.702 m)    General appearance: alert, cooperative and appears stated age Head: Normocephalic, without obvious abnormality, atraumatic Neck: no adenopathy, supple, symmetrical, trachea midline and thyroid normal to inspection and palpation Lungs: clear to auscultation bilaterally Cardiovascular: regular rate and rhythm Breasts: normal appearance, no masses or tenderness Abdomen: soft, non-tender; non distended,  no masses,  no organomegaly Extremities: extremities normal, atraumatic, no cyanosis or edema Skin: Skin color, texture, turgor normal. No rashes or lesions Lymph nodes: Cervical, supraclavicular, and axillary nodes normal. No abnormal inguinal nodes palpated Neurologic: Grossly normal   Pelvic: External genitalia:  no lesions              Urethra:  normal appearing urethra with no masses, tenderness or lesions              Bartholins and Skenes: normal                 Vagina: normal appearing vagina with normal color and discharge, no lesions              Cervix: no lesions and IUD string 4 cm,  IUD removed with ringed forceps               Bimanual Exam:  Uterus:  retroverted, mobile, not tender, 6-8 week size (stable to smaller)              Adnexa: no mass, fullness, tenderness               Rectovaginal: Confirms               Anus:  normal sphincter tone, no lesions  Chaperone was present for exam.  A:  Well Woman with normal exam  Fibroid uterus  IUD, will remove  P:   Labs with primary  DEXA with primary  Discussed breast self exam  Discussed calcium and vit D intake  Pap this year  IUD removed  Call with any further bleeding  Mammogram and colonoscopy UTD

## 2018-03-10 ENCOUNTER — Other Ambulatory Visit (HOSPITAL_COMMUNITY)
Admission: RE | Admit: 2018-03-10 | Discharge: 2018-03-10 | Disposition: A | Discharge status: Home / Self Care | Payer: BC Managed Care – PPO | Source: Ambulatory Visit | Attending: Obstetrics and Gynecology | Admitting: Obstetrics and Gynecology

## 2018-03-10 ENCOUNTER — Ambulatory Visit: Payer: BC Managed Care – PPO | Admitting: Obstetrics and Gynecology

## 2018-03-10 ENCOUNTER — Encounter: Payer: Self-pay | Admitting: Obstetrics and Gynecology

## 2018-03-10 ENCOUNTER — Other Ambulatory Visit: Payer: Self-pay

## 2018-03-10 VITALS — BP 108/68 | HR 80 | Ht 67.0 in | Wt 141.8 lb

## 2018-03-10 DIAGNOSIS — Z124 Encounter for screening for malignant neoplasm of cervix: Secondary | ICD-10-CM | POA: Diagnosis present

## 2018-03-10 DIAGNOSIS — Z30432 Encounter for removal of intrauterine contraceptive device: Secondary | ICD-10-CM

## 2018-03-10 DIAGNOSIS — D259 Leiomyoma of uterus, unspecified: Secondary | ICD-10-CM

## 2018-03-10 DIAGNOSIS — Z01419 Encounter for gynecological examination (general) (routine) without abnormal findings: Secondary | ICD-10-CM

## 2018-03-10 NOTE — Patient Instructions (Signed)
EXERCISE AND DIET:  We recommended that you start or continue a regular exercise program for good health. Regular exercise means any activity that makes your heart beat faster and makes you sweat.  We recommend exercising at least 30 minutes per day at least 3 days a week, preferably 4 or 5.  We also recommend a diet low in fat and sugar.  Inactivity, poor dietary choices and obesity can cause diabetes, heart attack, stroke, and kidney damage, among others.    ALCOHOL AND SMOKING:  Women should limit their alcohol intake to no more than 7 drinks/beers/glasses of wine (combined, not each!) per week. Moderation of alcohol intake to this level decreases your risk of breast cancer and liver damage. And of course, no recreational drugs are part of a healthy lifestyle.  And absolutely no smoking or even second hand smoke. Most people know smoking can cause heart and lung diseases, but did you know it also contributes to weakening of your bones? Aging of your skin?  Yellowing of your teeth and nails?  CALCIUM AND VITAMIN D:  Adequate intake of calcium and Vitamin D are recommended.  The recommendations for exact amounts of these supplements seem to change often, but generally speaking 1,200 mg of calcium (between diet and supplements) and 800 units of Vitamin D per day seems prudent. Certain women may benefit from higher intake of Vitamin D.  If you are among these women, your doctor will have told you during your visit.    PAP SMEARS:  Pap smears, to check for cervical cancer or precancers,  have traditionally been done yearly, although recent scientific advances have shown that most women can have pap smears less often.  However, every woman still should have a physical exam from her gynecologist every year. It will include a breast check, inspection of the vulva and vagina to check for abnormal growths or skin changes, a visual exam of the cervix, and then an exam to evaluate the size and shape of the uterus and  ovaries.  And after 61 years of age, a rectal exam is indicated to check for rectal cancers. We will also provide age appropriate advice regarding health maintenance, like when you should have certain vaccines, screening for sexually transmitted diseases, bone density testing, colonoscopy, mammograms, etc.   MAMMOGRAMS:  All women over 40 years old should have a yearly mammogram. Many facilities now offer a "3D" mammogram, which may cost around $50 extra out of pocket. If possible,  we recommend you accept the option to have the 3D mammogram performed.  It both reduces the number of women who will be called back for extra views which then turn out to be normal, and it is better than the routine mammogram at detecting truly abnormal areas.    COLONOSCOPY:  Colonoscopy to screen for colon cancer is recommended for all women at age 50.  We know, you hate the idea of the prep.  We agree, BUT, having colon cancer and not knowing it is worse!!  Colon cancer so often starts as a polyp that can be seen and removed at colonscopy, which can quite literally save your life!  And if your first colonoscopy is normal and you have no family history of colon cancer, most women don't have to have it again for 10 years.  Once every ten years, you can do something that may end up saving your life, right?  We will be happy to help you get it scheduled when you are ready.    Be sure to check your insurance coverage so you understand how much it will cost.  It may be covered as a preventative service at no cost, but you should check your particular policy.      Breast Self-Awareness Breast self-awareness means being familiar with how your breasts look and feel. It involves checking your breasts regularly and reporting any changes to your health care provider. Practicing breast self-awareness is important. A change in your breasts can be a sign of a serious medical problem. Being familiar with how your breasts look and feel allows  you to find any problems early, when treatment is more likely to be successful. All women should practice breast self-awareness, including women who have had breast implants. How to do a breast self-exam One way to learn what is normal for your breasts and whether your breasts are changing is to do a breast self-exam. To do a breast self-exam: Look for Changes  1. Remove all the clothing above your waist. 2. Stand in front of a mirror in a room with good lighting. 3. Put your hands on your hips. 4. Push your hands firmly downward. 5. Compare your breasts in the mirror. Look for differences between them (asymmetry), such as: ? Differences in shape. ? Differences in size. ? Puckers, dips, and bumps in one breast and not the other. 6. Look at each breast for changes in your skin, such as: ? Redness. ? Scaly areas. 7. Look for changes in your nipples, such as: ? Discharge. ? Bleeding. ? Dimpling. ? Redness. ? A change in position. Feel for Changes  Carefully feel your breasts for lumps and changes. It is best to do this while lying on your back on the floor and again while sitting or standing in the shower or tub with soapy water on your skin. Feel each breast in the following way:  Place the arm on the side of the breast you are examining above your head.  Feel your breast with the other hand.  Start in the nipple area and make  inch (2 cm) overlapping circles to feel your breast. Use the pads of your three middle fingers to do this. Apply light pressure, then medium pressure, then firm pressure. The light pressure will allow you to feel the tissue closest to the skin. The medium pressure will allow you to feel the tissue that is a little deeper. The firm pressure will allow you to feel the tissue close to the ribs.  Continue the overlapping circles, moving downward over the breast until you feel your ribs below your breast.  Move one finger-width toward the center of the body.  Continue to use the  inch (2 cm) overlapping circles to feel your breast as you move slowly up toward your collarbone.  Continue the up and down exam using all three pressures until you reach your armpit.  Write Down What You Find  Write down what is normal for each breast and any changes that you find. Keep a written record with breast changes or normal findings for each breast. By writing this information down, you do not need to depend only on memory for size, tenderness, or location. Write down where you are in your menstrual cycle, if you are still menstruating. If you are having trouble noticing differences in your breasts, do not get discouraged. With time you will become more familiar with the variations in your breasts and more comfortable with the exam. How often should I examine my breasts? Examine   your breasts every month. If you are breastfeeding, the best time to examine your breasts is after a feeding or after using a breast pump. If you menstruate, the best time to examine your breasts is 5-7 days after your period is over. During your period, your breasts are lumpier, and it may be more difficult to notice changes. When should I see my health care provider? See your health care provider if you notice:  A change in shape or size of your breasts or nipples.  A change in the skin of your breast or nipples, such as a reddened or scaly area.  Unusual discharge from your nipples.  A lump or thick area that was not there before.  Pain in your breasts.  Anything that concerns you.  This information is not intended to replace advice given to you by your health care provider. Make sure you discuss any questions you have with your health care provider. Document Released: 05/18/2005 Document Revised: 10/24/2015 Document Reviewed: 04/07/2015 Elsevier Interactive Patient Education  2018 Elsevier Inc.  

## 2018-03-11 LAB — CYTOLOGY - PAP
Diagnosis: NEGATIVE
HPV: NOT DETECTED

## 2018-12-28 ENCOUNTER — Ambulatory Visit
Admission: RE | Admit: 2018-12-28 | Discharge: 2018-12-28 | Disposition: A | Discharge status: Home / Self Care | Payer: BC Managed Care – PPO | Source: Ambulatory Visit | Attending: Family Medicine | Admitting: Family Medicine

## 2018-12-28 ENCOUNTER — Other Ambulatory Visit: Payer: Self-pay | Admitting: Family Medicine

## 2018-12-28 DIAGNOSIS — M542 Cervicalgia: Secondary | ICD-10-CM

## 2019-01-03 ENCOUNTER — Encounter: Payer: Self-pay | Admitting: Physical Therapy

## 2019-01-03 ENCOUNTER — Other Ambulatory Visit: Payer: Self-pay

## 2019-01-03 ENCOUNTER — Ambulatory Visit: Payer: BC Managed Care – PPO | Attending: Family Medicine | Admitting: Physical Therapy

## 2019-01-03 DIAGNOSIS — M62838 Other muscle spasm: Secondary | ICD-10-CM

## 2019-01-03 DIAGNOSIS — M542 Cervicalgia: Secondary | ICD-10-CM | POA: Insufficient documentation

## 2019-01-03 NOTE — Patient Instructions (Signed)
Access Code: [email or web address]  URL: [email or web address]  Date: 01/03/2019  Prepared by: Jari Favre   Exercises  Seated Cervical Rotation AROM - 10 reps - 3 sets - 1x daily - 7x weekly  Seated Cervical Sidebending AROM - 10 reps - 3 sets - 1x daily - 7x weekly  Seated Cervical Flexion AROM - 10 reps - 3 sets - 1x daily - 7x weekly  Seated Cervical Extension AROM - 10 reps - 3 sets - 1x daily - 7x weekly

## 2019-01-03 NOTE — Therapy (Signed)
[facility] [address], [place], [place], [address] Phone: [phone]   Fax:  [phone]  Physical Therapy Evaluation  Patient Details  Name: Annette Gentry MRN: [id number] Date of Birth: Oct 25, 1955 Referring Provider (PT): Lujean Amel, MD   Encounter Date: 01/03/2019  PT End of Session - 01/03/19 0945    Visit Number  1    Date for PT Re-Evaluation  02/28/19    Authorization Type  BCBS    PT Start Time  0935    PT Stop Time  1014    PT Time Calculation (min)  39 min    Activity Tolerance  Patient tolerated treatment well    Behavior During Therapy  [facility] for tasks assessed/performed       Past Medical History:  Diagnosis Date  . Arthritis   . Asthma   . Blood transfusion    AS INFANT  . 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 Diverticulitis of colon with perforation 04/20/2011  . Heart murmur   . Medical history non-contributory   . Multinodular goiter   . Osteopenia   . Palpitations    CARDIAC STUDIES DONE 2009 - WAS TOLD THEY WERE NORMAL  . Personal history of colonic polyps 04/23/2011  . Rash, skin    AROUND STOMA  . Sjogren's syndrome [facility])     Past Surgical History:  Procedure Laterality Date  . COLON RESECTION  04/22/2011   Procedure: COLON RESECTION;  Surgeon: Adin Hector, MD;  [facility]: [facility];  Service: General;  Laterality: N/A;  colon resection, drainage of abscess  . COLON SURGERY  04/2011,07/2011   colostomy take down  . COLOSTOMY  04/22/2011   Procedure: COLOSTOMY;  Surgeon: Adin Hector, MD;  [facility];  Service: General;  Laterality: N/A;  . DILATATION & CURETTAGE/HYSTEROSCOPY WITH TRUECLEAR N/A 12/19/2013   Procedure: DILATATION & CURETTAGE/HYSTEROSCOPY WITH TRUCLEAR;  Surgeon: Azalia Bilis, MD;  Location: [place] [facility];  Service: Gynecology;  Laterality: N/A;  . FOREIGN REMOVED  AGE 50   PEANUT REMOVED FROM LUNG  . HERNIA REPAIR    . KNEE MASS EXCISION  1970'S   OSTEOCHONDROMA REMOVED  . myoma on right thigh  2002 (summer)  . PROCTOSCOPY  08/14/2011   Procedure: PROCTOSCOPY;  Surgeon: Adin Hector, MD;  Location[facility];  Service: General;  Laterality: N/A;  rigid    There were no vitals filed for this visit.   Subjective Assessment - 01/03/19 0938    Subjective  Pt states she has had pain for a while but steadily getting worse.  Can't sleep on the right side.  Has been really getting bad for a couple of month.    Limitations  --   sleeping   Currently in Pain?  Yes    Pain Score  4     Pain Descriptors / Indicators  Aching;Sore    Pain Type  Chronic pain    Pain Radiating Towards  up to jaw    Pain Onset  More than a month ago    Pain Frequency  Intermittent    Aggravating Factors   sleeping positions can't get comfortabe, head side bend, putting in eye drops    Pain Relieving Factors  hold still, better in the morning    Effect of Pain on Daily Activities  avoids some stretches and things are more difficult    Multiple Pain Sites  No         [facility]  PT Assessment - 01/03/19 0001      Assessment   Medical Diagnosis  M54.2 (ICD-10-CM) - Cervicalgia    Referring Provider (PT)  Koirala, Dibas, MD      Precautions   Precautions  None      Restrictions   Weight Bearing Restrictions  No      Balance Screen   Has the patient fallen in the past 6 months  No      [place] residence    Living Arrangements  Spouse/significant other      Prior Function   Level of Independence  Independent    Vocation  Full time employment    Vocation Requirements  desk work, 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    Leisure  house project - yard work, bathroom and filling in crack and repaint      Cognition   Overall Cognitive Status  Within Functional Limits for tasks assessed      Posture/Postural Control   Posture/Postural Control  Postural limitations    Postural Limitations  Rounded Shoulders;Decreased lumbar  lordosis;Decreased thoracic kyphosis      ROM / Strength   AROM / PROM / Strength  AROM;Strength      AROM   Overall AROM Comments  bilat shoulder WNL; scapula movements normal    AROM Assessment Site  Cervical    Cervical Flexion  55    Cervical Extension  36   pain   Cervical - Right Side Bend  25    Cervical - Left Side Bend  25    Cervical - Right Rotation  30    Cervical - Left Rotation  55      Strength   Overall Strength Comments  Rt ER 4/5 and a little uncomfortable      Palpation   Palpation comment  C3/4 right rotation hypomobility; T1-3 hypomobile P-A, cervical paraspinals and upper traps tight      Ambulation/Gait   Gait Pattern  Within Functional Limits                Objective measurements completed on examination: See above findings.      [facility] Adult PT Treatment/Exercise - 01/03/19 0001      Self-Care   Self-Care  Other Self-Care Comments    Other Self-Care Comments   educated and performed initial HEP             PT Education - 01/03/19 1019    Education Details  Access Code: [email or web address]    Person(s) Educated  Patient    Methods  Explanation;Demonstration;Handout;Verbal cues    Comprehension  Verbalized understanding;Returned demonstration       PT Short Term Goals - 01/03/19 1340      PT SHORT TERM GOAL #1   Title  ind with initial HEP    Time  4    Period  Weeks    Status  New    Target Date  01/31/19      PT SHORT TERM GOAL #2   Title  pt will report 20% reduction in pain    Time  4    Period  Weeks    Status  New    Target Date  01/31/19        PT Long Term Goals - 01/03/19 1340      PT LONG TERM GOAL #1   Title  Pt will report 50% more ease with getting to sleep at night    Time  8    Period  Weeks    Status  New    Target Date  02/28/19      PT LONG TERM GOAL #2   Title  Pt will report 60% less pain at night after typical daily activities    Time  8    Period  Weeks    Status  New    Target Date   02/28/19      PT LONG TERM GOAL #3   Title  FOTO </= 36% limited    Time  8    Period  Weeks    Status  New    Target Date  02/28/19      PT LONG TERM GOAL #4   Title  ind with advanced HEP    Time  8    Period  Weeks    Status  New    Target Date  02/28/19      PT LONG TERM GOAL #5   Title  Pt will have at least 50 deg cervical extension in order to be able to use her eye drops without difficutly    Time  8    Period  Weeks    Status  New    Target Date  02/28/19             Plan - 01/03/19 1019    Clinical Impression Statement  Pt presents to skilled PT due to neck pain.  Pt demonstrates decreased cervical ROM and hypomobility of C2-5 and T1-3.  She has tight and TTP muscle around the cervical region.  Pt has posture abnormalities as mentioned.  She will benefit from skilled PTto address impairments so she can return to maximum functional activities such as yard work and renovations to her home.    Personal Factors and Comorbidities  Comorbidity 1    Comorbidities  chronic pain    Examination-Activity Limitations  Carry    Examination-Participation Restrictions  Driving    Stability/Clinical Decision Making  Stable/Uncomplicated    Clinical Decision Making  Low    Rehab Potential  Excellent    PT Frequency  2x / week    PT Duration  8 weeks    PT Treatment/Interventions  ADLs/Self Care Home Management;Cryotherapy;Electrical Stimulation;Moist Heat;Traction;Therapeutic activities;Therapeutic exercise;Manual techniques;Patient/family education;Neuromuscular re-education;Dry needling;Taping    PT Next Visit Plan  try manual cervical traction, STM and myofascial release, head nods, thoracic flexion stretch    PT Home Exercise Plan  Access Code: [email or web address]    Consulted and Agree with Plan of Care  Patient       Patient will benefit from skilled therapeutic intervention in order to improve the following deficits and impairments:  Pain, Postural dysfunction, Impaired  flexibility, Decreased coordination, Decreased range of motion, Increased muscle spasms  Visit Diagnosis: 1. Cervicalgia   2. Other muscle spasm        Problem List Patient Active Problem List   Diagnosis Date Noted  . Multinodular goiter 02/19/2015  . IUD (intrauterine device) in place 01/29/2014  . Fibroid, uterine 11/24/2013  . Menorrhagia, premenopausal 11/24/2013  . Personal history of colonic polyps 04/23/2011  . Diverticulitis of colon with perforation 04/20/2011  . Sjoegren syndrome 04/17/2011  . Asthma 04/17/2011    Jule Ser, PT 01/03/2019, 1:53 PM  [facility] [address] [place], [place], [address] Phone: [phone]   Fax:  [phone]  Name: Annette Gentry MRN: [id number] Date of Birth: Dec 17, 1955

## 2019-01-10 ENCOUNTER — Ambulatory Visit: Payer: BC Managed Care – PPO | Admitting: Physical Therapy

## 2019-01-10 ENCOUNTER — Encounter: Payer: Self-pay | Admitting: Physical Therapy

## 2019-01-10 ENCOUNTER — Other Ambulatory Visit: Payer: Self-pay

## 2019-01-10 DIAGNOSIS — M542 Cervicalgia: Secondary | ICD-10-CM | POA: Diagnosis not present

## 2019-01-10 DIAGNOSIS — M62838 Other muscle spasm: Secondary | ICD-10-CM

## 2019-01-10 NOTE — Therapy (Signed)
[facility] [address], [place], [place], [address] Phone: [phone]   Fax:  [phone]  Physical Therapy Treatment  Patient Details  Name: Annette Gentry MRN: [id number] Date of Birth: 19-May-1956 Referring Provider (PT): Lujean Amel, MD   Encounter Date: 01/10/2019  PT End of Session - 01/10/19 0932    Visit Number  2    Date for PT Re-Evaluation  02/28/19    Authorization Type  BCBS    PT Start Time  [phone]    PT Stop Time  1011    PT Time Calculation (min)  40 min    Activity Tolerance  Patient tolerated treatment well    Behavior During Therapy  [facility] for tasks assessed/performed       Past Medical History:  Diagnosis Date  . Arthritis   . Asthma   . Blood transfusion    AS INFANT  . 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 Diverticulitis of colon with perforation 04/20/2011  . Heart murmur   . Medical history non-contributory   . Multinodular goiter   . Osteopenia   . Palpitations    CARDIAC STUDIES DONE 2009 - WAS TOLD THEY WERE NORMAL  . Personal history of colonic polyps 04/23/2011  . Rash, skin    AROUND STOMA  . Sjogren's syndrome [facility])     Past Surgical History:  Procedure Laterality Date  . COLON RESECTION  04/22/2011   Procedure: COLON RESECTION;  Surgeon: Adin Hector, MD;  Location: [facility];  Service: General;  Laterality: N/A;  colon resection, drainage of abscess  . COLON SURGERY  04/2011,07/2011   colostomy take down  . COLOSTOMY  04/22/2011   Procedure: COLOSTOMY;  Surgeon: Adin Hector, MD;  [facility]: [facility];  Service: General;  Laterality: N/A;  . DILATATION & CURETTAGE/HYSTEROSCOPY WITH TRUECLEAR N/A 12/19/2013   Procedure: DILATATION & CURETTAGE/HYSTEROSCOPY WITH TRUCLEAR;  Surgeon: Azalia Bilis, MD;  Location: [facility] [facility];  Service: Gynecology;  Laterality: N/A;  . FOREIGN REMOVED  AGE 42   PEANUT REMOVED FROM LUNG  . HERNIA REPAIR    . KNEE MASS EXCISION  1970'S   OSTEOCHONDROMA REMOVED  . myoma on right thigh  2002 (summer)  . PROCTOSCOPY  08/14/2011   Procedure: PROCTOSCOPY;  Surgeon: Adin Hector, MD;  [facility]: [facility];  Service: General;  Laterality: N/A;  rigid    There were no vitals filed for this visit.  Subjective Assessment - 01/10/19 0933    Subjective  Pt states she is about the same.  Has been doing the stretches but not pushing it    Currently in Pain?  Yes    Pain Score  4     Pain Location  Neck    Pain Orientation  Right    Pain Descriptors / Indicators  Aching                       [facility] Adult PT Treatment/Exercise - 01/10/19 0001      Self-Care   Self-Care  Posture    Posture  reviewed sitting posture at desk and given handout      Exercises   Exercises  Lumbar      Lumbar Exercises: Seated   Other Seated Lumbar Exercises  thoracic flexion with rotation    Other Seated Lumbar Exercises  thoracic extension with ball behind back      Manual Therapy   Manual Therapy  Soft tissue mobilization;Manual  Traction    Soft tissue mobilization  suboccipitals, cervical parapsinals, upper trap Rt, scalenes Rt    Manual Traction  cervical       Trigger Point Dry Needling - 01/10/19 0001    Consent Given?  Yes    Education Handout Provided  --   verbally reviewed - give at next session   Muscles Treated Head and Neck  Suboccipitals;Oblique capitus;Cervical multifidi    Oblique Capitus Response  Twitch response elicited;Palpable increased muscle length   Rt side only   Cervical multifidi Response  Twitch reponse elicited;Palpable increased muscle length           PT Education - 01/10/19 1014    Education Details  Access Code: [email or web address] and posture    Person(s) Educated  Patient    Methods  Explanation;Demonstration;Verbal cues;Handout    Comprehension  Verbalized understanding;Returned demonstration       PT Short Term Goals - 01/03/19 1340      PT SHORT TERM GOAL #1   Title  ind with initial  HEP    Time  4    Period  Weeks    Status  New    Target Date  01/31/19      PT SHORT TERM GOAL #2   Title  pt will report 20% reduction in pain    Time  4    Period  Weeks    Status  New    Target Date  01/31/19        PT Long Term Goals - 01/03/19 1340      PT LONG TERM GOAL #1   Title  Pt will report 50% more ease with getting to sleep at night    Time  8    Period  Weeks    Status  New    Target Date  02/28/19      PT LONG TERM GOAL #2   Title  Pt will report 60% less pain at night after typical daily activities    Time  8    Period  Weeks    Status  New    Target Date  02/28/19      PT LONG TERM GOAL #3   Title  FOTO </= 36% limited    Time  8    Period  Weeks    Status  New    Target Date  02/28/19      PT LONG TERM GOAL #4   Title  ind with advanced HEP    Time  8    Period  Weeks    Status  New    Target Date  02/28/19      PT LONG TERM GOAL #5   Title  Pt will have at least 50 deg cervical extension in order to be able to use her eye drops without difficutly    Time  8    Period  Weeks    Status  New    Target Date  02/28/19            Plan - 01/10/19 1015    Clinical Impression Statement  Pt is doing well with initial HEP.  She responded well to manual treatment and felt improved ROM after today's treatment.  Pt was educated on posture and understands how she can apply it to her desk at home for less cervical tension. Pt will continuet o benefit from skilled PT to work on cervical ROM and posture.    PT Treatment/Interventions  ADLs/Self Care Home Management;Cryotherapy;Electrical Stimulation;Moist Heat;Traction;Therapeutic activities;Therapeutic exercise;Manual techniques;Patient/family education;Neuromuscular re-education;Dry needling;Taping    PT Next Visit Plan  f/u on response to DN and posture, posture strengthening head nods anterior cervical strength    PT Home Exercise Plan  Access Code: [email or web address]    Consulted and Agree with Plan of  Care  Patient       Patient will benefit from skilled therapeutic intervention in order to improve the following deficits and impairments:  Pain, Postural dysfunction, Impaired flexibility, Decreased coordination, Decreased range of motion, Increased muscle spasms  Visit Diagnosis: 1. Cervicalgia   2. Other muscle spasm        Problem List Patient Active Problem List   Diagnosis Date Noted  . Multinodular goiter 02/19/2015  . IUD (intrauterine device) in place 01/29/2014  . Fibroid, uterine 11/24/2013  . Menorrhagia, premenopausal 11/24/2013  . Personal history of colonic polyps 04/23/2011  . Diverticulitis of colon with perforation 04/20/2011  . Sjoegren syndrome 04/17/2011  . Asthma 04/17/2011    Jule Ser, PT 01/10/2019, 10:23 AM  [facility] [address] [place], [place], [address] Phone: [phone]   Fax:  [phone]  Name: Annette Gentry MRN: [id number] Date of Birth: 1956-04-04

## 2019-01-10 NOTE — Patient Instructions (Signed)
Access Code: [email or web address]  URL: [email or web address]  Date: 01/10/2019  Prepared by: Jari Favre   Exercises  Seated Cervical Rotation AROM - 10 reps - 1 sets - 1x daily - 7x weekly  Seated Cervical Sidebending AROM - 10 reps - 1 sets - 1x daily - 7x weekly  Seated Cervical Flexion AROM - 10 reps - 1 sets - 1x daily - 7x weekly  Seated Cervical Extension AROM - 10 reps - 1 sets - 1x daily - 7x weekly  Seated Thoracic Lumbar Extension - 10 reps - 1 sets - 5 sec hold - 1x daily - 7x weekly  Seated Thoracic Flexion and Rotation with Arms Crossed - 10 reps - 1 sets - 5 sec hold - 1x daily - 7x weekly

## 2019-01-17 ENCOUNTER — Other Ambulatory Visit: Payer: Self-pay

## 2019-01-17 ENCOUNTER — Encounter: Payer: Self-pay | Admitting: Physical Therapy

## 2019-01-17 ENCOUNTER — Ambulatory Visit: Payer: BC Managed Care – PPO | Admitting: Physical Therapy

## 2019-01-17 DIAGNOSIS — M542 Cervicalgia: Secondary | ICD-10-CM

## 2019-01-17 DIAGNOSIS — M62838 Other muscle spasm: Secondary | ICD-10-CM

## 2019-01-17 NOTE — Therapy (Signed)
[facility] [address] [place], [place], [address] Phone: [phone]   Fax:  [phone]  Physical Therapy Treatment  Patient Details  Name: Annette Gentry MRN: [id number] Date of Birth: 1956/05/27 Referring Provider (PT): Lujean Amel, MD   Encounter Date: 01/17/2019  PT End of Session - 01/17/19 0927    Visit Number  3    Date for PT Re-Evaluation  02/28/19    PT Start Time  0930    PT Stop Time  1012    PT Time Calculation (min)  42 min    Activity Tolerance  Patient tolerated treatment well    Behavior During Therapy  [facility] for tasks assessed/performed       Past Medical History:  Diagnosis Date  . Arthritis   . Asthma   . Blood transfusion    AS INFANT  . 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 Diverticulitis of colon with perforation 04/20/2011  . Heart murmur   . Medical history non-contributory   . Multinodular goiter   . Osteopenia   . Palpitations    CARDIAC STUDIES DONE 2009 - WAS TOLD THEY WERE NORMAL  . Personal history of colonic polyps 04/23/2011  . Rash, skin    AROUND STOMA  . Sjogren's syndrome [facility])     Past Surgical History:  Procedure Laterality Date  . COLON RESECTION  04/22/2011   Procedure: COLON RESECTION;  Surgeon: Adin Hector, MD;  [facility];  Service: General;  Laterality: N/A;  colon resection, drainage of abscess  . COLON SURGERY  04/2011,07/2011   colostomy take down  . COLOSTOMY  04/22/2011   Procedure: COLOSTOMY;  Surgeon: Adin Hector, MD;  [facility];  Service: General;  Laterality: N/A;  . DILATATION & CURETTAGE/HYSTEROSCOPY WITH TRUECLEAR N/A 12/19/2013   Procedure: DILATATION & CURETTAGE/HYSTEROSCOPY WITH TRUCLEAR;  Surgeon: Azalia Bilis, MD;  Location: [facility];  Service: Gynecology;  Laterality: N/A;  . FOREIGN REMOVED  AGE 26   PEANUT REMOVED FROM LUNG  . HERNIA REPAIR    . KNEE MASS EXCISION  1970'S   OSTEOCHONDROMA REMOVED  . myoma on  right thigh  2002 (summer)  . PROCTOSCOPY  08/14/2011   Procedure: PROCTOSCOPY;  Surgeon: Adin Hector, MD;  [facility];  Service: General;  Laterality: N/A;  rigid    There were no vitals filed for this visit.  Subjective Assessment - 01/17/19 0934    Subjective  Pt states she feels good after the treatment.  There is a little stiffness when she wakes.  Then loosens up.  Then as the day progresses feels like there is no comfortable position until lying down    Limitations  Sitting    How long can you sit comfortably?  really gets noticeable late afternoon    Currently in Pain?  No/denies                       [facility] Adult PT Treatment/Exercise - 01/17/19 0001      Exercises   Exercises  Neck      Neck Exercises: Seated   Other Seated Exercise  upper trap stretch with cervical flexion - 2x 30sec bilat      Lumbar Exercises: Seated   Other Seated Lumbar Exercises  thoracic flexion with rotation      Manual Therapy   Soft tissue mobilization  suboccipitals, cervical parapsinals, upper trap Rt, thoracic parapsinals Lt>Rt  Trigger Point Dry Needling - 01/17/19 0001    Consent Given?  Yes    Muscles Treated Head and Neck  Upper trapezius    Other Dry Needling  throracic multifidi T1-6     Upper Trapezius Response  Twitch reponse elicited;Palpable increased muscle length   attachement site   Cervical multifidi Response  Twitch reponse elicited;Palpable increased muscle length             PT Short Term Goals - 01/17/19 1100      PT SHORT TERM GOAL #1   Title  ind with initial HEP    Status  Achieved      PT SHORT TERM GOAL #2   Title  pt will report 20% reduction in pain    Baseline  still pains at about late afternoon    Status  On-going        PT Long Term Goals - 01/03/19 1340      PT LONG TERM GOAL #1   Title  Pt will report 50% more ease with getting to sleep at night    Time  8    Period  Weeks    Status  New    Target  Date  02/28/19      PT LONG TERM GOAL #2   Title  Pt will report 60% less pain at night after typical daily activities    Time  8    Period  Weeks    Status  New    Target Date  02/28/19      PT LONG TERM GOAL #3   Title  FOTO </= 36% limited    Time  8    Period  Weeks    Status  New    Target Date  02/28/19      PT LONG TERM GOAL #4   Title  ind with advanced HEP    Time  8    Period  Weeks    Status  New    Target Date  02/28/19      PT LONG TERM GOAL #5   Title  Pt will have at least 50 deg cervical extension in order to be able to use her eye drops without difficutly    Time  8    Period  Weeks    Status  New    Target Date  02/28/19            Plan - 01/17/19 1015    Clinical Impression Statement  Pt did well with treatment today.  She reports she had some improvement with techniques last time just not lasting yet.  Pt had improved soft tissue length.  She was able to get a better stretch with review the stretches and adjustments made with cues given to increase flexion.  Pt will benefit from skilled PT to continue to improve postural strength.    PT Treatment/Interventions  ADLs/Self Care Home Management;Cryotherapy;Electrical Stimulation;Moist Heat;Traction;Therapeutic activities;Therapeutic exercise;Manual techniques;Patient/family education;Neuromuscular re-education;Dry needling;Taping    PT Next Visit Plan  f/u on response to DN #2, posture strengthening head nods anterior cervical strength    PT Home Exercise Plan  Access Code: [email or web address]    Consulted and Agree with Plan of Care  Patient       Patient will benefit from skilled therapeutic intervention in order to improve the following deficits and impairments:  Pain, Postural dysfunction, Impaired flexibility, Decreased coordination, Decreased range of motion, Increased muscle spasms  Visit Diagnosis: 1. Cervicalgia  2. Other muscle spasm        Problem List Patient Active Problem List   Diagnosis  Date Noted  . Multinodular goiter 02/19/2015  . IUD (intrauterine device) in place 01/29/2014  . Fibroid, uterine 11/24/2013  . Menorrhagia, premenopausal 11/24/2013  . Personal history of colonic polyps 04/23/2011  . Diverticulitis of colon with perforation 04/20/2011  . Sjoegren syndrome 04/17/2011  . Asthma 04/17/2011    Jule Ser, PT 01/17/2019, 11:06 AM  [facility] [address], [place], [place], [address] Phone: [phone]   Fax:  [phone]  Name: JACQUALINE WEICHEL MRN: [id number] Date of Birth: March 21, 1956

## 2019-01-24 ENCOUNTER — Other Ambulatory Visit: Payer: Self-pay

## 2019-01-24 ENCOUNTER — Ambulatory Visit: Payer: BC Managed Care – PPO | Admitting: Physical Therapy

## 2019-01-24 ENCOUNTER — Encounter: Payer: Self-pay | Admitting: Physical Therapy

## 2019-01-24 DIAGNOSIS — M62838 Other muscle spasm: Secondary | ICD-10-CM

## 2019-01-24 DIAGNOSIS — M542 Cervicalgia: Secondary | ICD-10-CM

## 2019-01-24 NOTE — Therapy (Signed)
[facility] [address], [place], [place], [address] Phone: [phone]   Fax:  [phone]  Physical Therapy Treatment  Patient Details  Name: Annette Gentry MRN: [id number] Date of Birth: 1956-02-20 Referring Provider (PT): Lujean Amel, MD   Encounter Date: 01/24/2019  PT End of Session - 01/24/19 0926    Visit Number  4    Date for PT Re-Evaluation  02/28/19    Authorization Type  BCBS    PT Start Time  0924    PT Stop Time  1008    PT Time Calculation (min)  44 min    Activity Tolerance  Patient tolerated treatment well    Behavior During Therapy  [facility] for tasks assessed/performed       Past Medical History:  Diagnosis Date  . Arthritis   . Asthma   . Blood transfusion    AS INFANT  . 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 Diverticulitis of colon with perforation 04/20/2011  . Heart murmur   . Medical history non-contributory   . Multinodular goiter   . Osteopenia   . Palpitations    CARDIAC STUDIES DONE 2009 - WAS TOLD THEY WERE NORMAL  . Personal history of colonic polyps 04/23/2011  . Rash, skin    AROUND STOMA  . Sjogren's syndrome [facility])     Past Surgical History:  Procedure Laterality Date  . COLON RESECTION  04/22/2011   Procedure: COLON RESECTION;  Surgeon: Adin Hector, MD;  [facility];  Service: General;  Laterality: N/A;  colon resection, drainage of abscess  . COLON SURGERY  04/2011,07/2011   colostomy take down  . COLOSTOMY  04/22/2011   Procedure: COLOSTOMY;  Surgeon: Adin Hector, MD;  [facility];  Service: General;  Laterality: N/A;  . DILATATION & CURETTAGE/HYSTEROSCOPY WITH TRUECLEAR N/A 12/19/2013   Procedure: DILATATION & CURETTAGE/HYSTEROSCOPY WITH TRUCLEAR;  Surgeon: Azalia Bilis, MD;  Location: [facility];  Service: Gynecology;  Laterality: N/A;  . FOREIGN REMOVED  AGE 47   PEANUT REMOVED FROM LUNG  . HERNIA REPAIR    . KNEE MASS EXCISION  1970'S   OSTEOCHONDROMA REMOVED  . myoma on right thigh  2002 (summer)  . PROCTOSCOPY  08/14/2011   Procedure: PROCTOSCOPY;  Surgeon: Adin Hector, MD;  [facility];  Service: General;  Laterality: N/A;  rigid    There were no vitals filed for this visit.  Subjective Assessment - 01/24/19 0936    Subjective  I have had some good days.  I think it is getting a little better.  Some days my neck feels sore again though    Limitations  Sitting    How long can you sit comfortably?  really gets noticeable late afternoon    Currently in Pain?  No/denies                       [facility] Adult PT Treatment/Exercise - 01/24/19 0001      Neck Exercises: Prone   Other Prone Exercise  on red pball - W and T - 15x each    Other Prone Exercise  flexion thoracic and cervical stretch- on red pball2 x 1 min between exercises      Lumbar Exercises: Standing   Shoulder Extension  Strengthening;Power Tower;Both;20 reps;Limitations    Shoulder Extension Limitations  20#    Other Standing Lumbar Exercises  upper trap and SCM stretch on the Rt side only -  2 x 30sec      Manual Therapy   Manual Therapy  Myofascial release    Manual therapy comments  supine    Myofascial Release  suboccipitals, cervical parapsinals, upper trap Rt, thoracic parapsinals Rt side mostly               PT Short Term Goals - 01/24/19 [id number]      PT SHORT TERM GOAL #2   Title  pt will report 20% reduction in pain    Baseline  I think it is better, there are days where the pain is less but sometimes it comes back    Status  On-going        PT Long Term Goals - 01/03/19 1340      PT LONG TERM GOAL #1   Title  Pt will report 50% more ease with getting to sleep at night    Time  8    Period  Weeks    Status  New    Target Date  02/28/19      PT LONG TERM GOAL #2   Title  Pt will report 60% less pain at night after typical daily activities    Time  8    Period  Weeks    Status  New    Target Date   02/28/19      PT LONG TERM GOAL #3   Title  FOTO </= 36% limited    Time  8    Period  Weeks    Status  New    Target Date  02/28/19      PT LONG TERM GOAL #4   Title  ind with advanced HEP    Time  8    Period  Weeks    Status  New    Target Date  02/28/19      PT LONG TERM GOAL #5   Title  Pt will have at least 50 deg cervical extension in order to be able to use her eye drops without difficutly    Time  8    Period  Weeks    Status  New    Target Date  02/28/19            Plan - 01/24/19 1015    Clinical Impression Statement  Pt did well with addition of exercises.  She felt good doing the SCM stretch on the Rt side but increased pain on Left so she was given stretch added to HEP for Rt side only.  Pt responded well to myofascial techniques to loosen SCM and scalenes on the Rt side.  She reports feeling improved ROm after treatment today.    PT Treatment/Interventions  ADLs/Self Care Home Management;Cryotherapy;Electrical Stimulation;Moist Heat;Traction;Therapeutic activities;Therapeutic exercise;Manual techniques;Patient/family education;Neuromuscular re-education;Dry needling;Taping    PT Next Visit Plan  f/u on response to myofascial release to scalenes and SCM, continue posture strengthening, head nods anterior cervical strength    PT Home Exercise Plan  Access Code: [email or web address]    Consulted and Agree with Plan of Care  Patient       Patient will benefit from skilled therapeutic intervention in order to improve the following deficits and impairments:  Pain, Postural dysfunction, Impaired flexibility, Decreased coordination, Decreased range of motion, Increased muscle spasms  Visit Diagnosis: Cervicalgia  Other muscle spasm     Problem List Patient Active Problem List   Diagnosis Date Noted  . Multinodular goiter 02/19/2015  . IUD (intrauterine device) in place 01/29/2014  .  Fibroid, uterine 11/24/2013  . Menorrhagia, premenopausal 11/24/2013  . Personal  history of colonic polyps 04/23/2011  . Diverticulitis of colon with perforation 04/20/2011  . Sjoegren syndrome 04/17/2011  . Asthma 04/17/2011    Jule Ser, PT 01/24/2019, 10:22 AM  [facility] [address] [place], [place], [address] Phone: [phone]   Fax:  [phone]  Name: Annette Gentry MRN: [id number] Date of Birth: 1955-06-06

## 2019-01-31 ENCOUNTER — Ambulatory Visit: Payer: BC Managed Care – PPO | Attending: Family Medicine | Admitting: Physical Therapy

## 2019-01-31 ENCOUNTER — Encounter: Payer: Self-pay | Admitting: Physical Therapy

## 2019-01-31 ENCOUNTER — Other Ambulatory Visit: Payer: Self-pay

## 2019-01-31 DIAGNOSIS — M62838 Other muscle spasm: Secondary | ICD-10-CM

## 2019-01-31 DIAGNOSIS — M542 Cervicalgia: Secondary | ICD-10-CM

## 2019-01-31 NOTE — Patient Instructions (Signed)
Access Code: [email or web address]  URL: [email or web address]  Date: 01/31/2019  Prepared by: Jari Favre   Exercises  Seated Cervical Rotation AROM - 10 reps - 1 sets - 1x daily - 7x weekly  Seated Cervical Sidebending AROM - 10 reps - 1 sets - 1x daily - 7x weekly  Seated Cervical Flexion AROM - 10 reps - 1 sets - 1x daily - 7x weekly  Seated Cervical Extension AROM - 10 reps - 1 sets - 1x daily - 7x weekly  Seated Thoracic Lumbar Extension - 10 reps - 1 sets - 5 sec hold - 1x daily - 7x weekly  Seated Thoracic Flexion and Rotation with Arms Crossed - 10 reps - 1 sets - 5 sec hold - 1x daily - 7x weekly  Sternocleidomastoid Stretch - 3 reps - 1 sets - 30 sec hold - 1x daily - 7x weekly  Supine Deep Neck Flexor Training - 10 reps - 2 sets - 3 sec hold - 1x daily - 7x weekly  Patient Education  Office Posture

## 2019-01-31 NOTE — Therapy (Signed)
[facility] [address], [place], [place], [address] Phone: [phone]   Fax:  [phone]  Physical Therapy Treatment  Patient Details  Name: Annette Gentry MRN: [id number] Date of Birth: 01-02-1956 Referring Provider (PT): Lujean Amel, MD   Encounter Date: 01/31/2019  PT End of Session - 01/31/19 0934    Visit Number  5    Date for PT Re-Evaluation  02/28/19    Authorization Type  BCBS    PT Start Time  0931    PT Stop Time  1010    PT Time Calculation (min)  39 min    Activity Tolerance  Patient tolerated treatment well    Behavior During Therapy  [facility] for tasks assessed/performed       Past Medical History:  Diagnosis Date  . Arthritis   . Asthma   . Blood transfusion    AS INFANT  . 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 Diverticulitis of colon with perforation 04/20/2011  . Heart murmur   . Medical history non-contributory   . Multinodular goiter   . Osteopenia   . Palpitations    CARDIAC STUDIES DONE 2009 - WAS TOLD THEY WERE NORMAL  . Personal history of colonic polyps 04/23/2011  . Rash, skin    AROUND STOMA  . Sjogren's syndrome [facility])     Past Surgical History:  Procedure Laterality Date  . COLON RESECTION  04/22/2011   Procedure: COLON RESECTION;  Surgeon: Adin Hector, MD;  Location: [facility];  Service: General;  Laterality: N/A;  colon resection, drainage of abscess  . COLON SURGERY  04/2011,07/2011   colostomy take down  . COLOSTOMY  04/22/2011   Procedure: COLOSTOMY;  Surgeon: Adin Hector, MD;  Location: [facility];  Service: General;  Laterality: N/A;  . DILATATION & CURETTAGE/HYSTEROSCOPY WITH TRUECLEAR N/A 12/19/2013   Procedure: DILATATION & CURETTAGE/HYSTEROSCOPY WITH TRUCLEAR;  Surgeon: Azalia Bilis, MD;  Location: [place] [facility];  Service: Gynecology;  Laterality: N/A;  . FOREIGN REMOVED  AGE 66   PEANUT REMOVED FROM LUNG  . HERNIA REPAIR    . KNEE MASS EXCISION  1970'S    OSTEOCHONDROMA REMOVED  . myoma on right thigh  2002 (summer)  . PROCTOSCOPY  08/14/2011   Procedure: PROCTOSCOPY;  Surgeon: Adin Hector, MD;  [facility];  Service: General;  Laterality: N/A;  rigid    There were no vitals filed for this visit.  Subjective Assessment - 01/31/19 0935    Subjective  I think the neck is doing okay.  My activities during the day are straining on my neck such as to see the computer screen. I am having some achiness this morning and it is harder to find a position.    Currently in Pain?  Yes    Pain Score  3     Pain Location  Neck    Pain Orientation  Right    Pain Descriptors / Indicators  Aching    Pain Type  Chronic pain                       [facility] Adult PT Treatment/Exercise - 01/31/19 0001      Neck Exercises: Standing   Other Standing Exercises  diagonals and W's against the wall - foam roll behind back - yellow band - 15 x each      Neck Exercises: Supine   Capital Flexion  20 reps;3 secs  Capital Flexion Limitations  10 reps with a slight lift - cues not to activate SMCs      Manual Therapy   Myofascial Release  suboccipitals, cervical parapsinals, upper trap Rt, thoracic parapsinals Rt side mostly      supine horizontal abduction - yellow band  -20x Standing shoulder ext - green band - 20x       PT Education - 01/31/19 1001    Education Details  Access Code: [email or web address]    Person(s) Educated  Patient    Methods  Explanation;Demonstration;Handout;Verbal cues    Comprehension  Verbalized understanding;Returned demonstration       PT Short Term Goals - 01/24/19 0928      PT SHORT TERM GOAL #2   Title  pt will report 20% reduction in pain    Baseline  I think it is better, there are days where the pain is less but sometimes it comes back    Status  On-going        PT Long Term Goals - 01/03/19 1340      PT LONG TERM GOAL #1   Title  Pt will report 50% more ease with getting to sleep at night     Time  8    Period  Weeks    Status  New    Target Date  02/28/19      PT LONG TERM GOAL #2   Title  Pt will report 60% less pain at night after typical daily activities    Time  8    Period  Weeks    Status  New    Target Date  02/28/19      PT LONG TERM GOAL #3   Title  FOTO </= 36% limited    Time  8    Period  Weeks    Status  New    Target Date  02/28/19      PT LONG TERM GOAL #4   Title  ind with advanced HEP    Time  8    Period  Weeks    Status  New    Target Date  02/28/19      PT LONG TERM GOAL #5   Title  Pt will have at least 50 deg cervical extension in order to be able to use her eye drops without difficutly    Time  8    Period  Weeks    Status  New    Target Date  02/28/19            Plan - 01/31/19 1018    Clinical Impression Statement  Pt did not have as much muscle tension palpated in cervical paraspinals and subocciptals today.  She did have fascial tension that responded well to fascial release.  She began to work on deep neck flexor exercises. She did well with cues and was able to perform correctly .  She will benefit from skilled PT to porgress posture and neck strength for improved ability to perform job releated tasks without pain.    PT Treatment/Interventions  ADLs/Self Care Home Management;Cryotherapy;Electrical Stimulation;Moist Heat;Traction;Therapeutic activities;Therapeutic exercise;Manual techniques;Patient/family education;Neuromuscular re-education;Dry needling;Taping    PT Next Visit Plan  myofascial to cervical and scalenes, progress posture strengthening    PT Home Exercise Plan  Access Code: [email or web address]    Consulted and Agree with Plan of Care  Patient       Patient will benefit from skilled therapeutic intervention in order to improve the following deficits and  impairments:  Pain, Postural dysfunction, Impaired flexibility, Decreased coordination, Decreased range of motion, Increased muscle spasms  Visit  Diagnosis: Cervicalgia  Other muscle spasm     Problem List Patient Active Problem List   Diagnosis Date Noted  . Multinodular goiter 02/19/2015  . IUD (intrauterine device) in place 01/29/2014  . Fibroid, uterine 11/24/2013  . Menorrhagia, premenopausal 11/24/2013  . Personal history of colonic polyps 04/23/2011  . Diverticulitis of colon with perforation 04/20/2011  . Sjoegren syndrome 04/17/2011  . Asthma 04/17/2011    Jule Ser, PT 01/31/2019, 10:23 AM  [facility] [address], [place], [place], [address] Phone: [phone]   Fax:  [phone]  Name: HERTHA CAMPOSANO MRN: [id number] Date of Birth: [birth year]

## 2019-02-07 ENCOUNTER — Ambulatory Visit: Payer: BC Managed Care – PPO | Admitting: Physical Therapy

## 2019-02-07 ENCOUNTER — Other Ambulatory Visit: Payer: Self-pay

## 2019-02-07 DIAGNOSIS — M542 Cervicalgia: Secondary | ICD-10-CM

## 2019-02-07 NOTE — Therapy (Signed)
[facility] [address], [place], [place], [address] Phone: [phone]   Fax:  [phone]  Physical Therapy Treatment  Patient Details  Name: Annette Gentry MRN: [id number] Date of Birth: [birth year] Referring Provider (PT): Lujean Amel, MD   Encounter Date: 02/07/2019  PT End of Session - 02/07/19 0924    Visit Number  6    Date for PT Re-Evaluation  02/28/19    Authorization Type  BCBS    PT Start Time  0930    PT Stop Time  1016    PT Time Calculation (min)  46 min    Activity Tolerance  Patient tolerated treatment well    Behavior During Therapy  [facility] for tasks assessed/performed       Past Medical History:  Diagnosis Date  . Arthritis   . Asthma   . Blood transfusion    AS INFANT  . 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 Diverticulitis of colon with perforation 04/20/2011  . Heart murmur   . Medical history non-contributory   . Multinodular goiter   . Osteopenia   . Palpitations    CARDIAC STUDIES DONE 2009 - WAS TOLD THEY WERE NORMAL  . Personal history of colonic polyps 04/23/2011  . Rash, skin    AROUND STOMA  . Sjogren's syndrome [facility])     Past Surgical History:  Procedure Laterality Date  . COLON RESECTION  04/22/2011   Procedure: COLON RESECTION;  Surgeon: Adin Hector, MD;  [facility];  Service: General;  Laterality: N/A;  colon resection, drainage of abscess  . COLON SURGERY  04/2011,07/2011   colostomy take down  . COLOSTOMY  04/22/2011   Procedure: COLOSTOMY;  Surgeon: Adin Hector, MD;  [facility];  Service: General;  Laterality: N/A;  . DILATATION & CURETTAGE/HYSTEROSCOPY WITH TRUECLEAR N/A 12/19/2013   Procedure: DILATATION & CURETTAGE/HYSTEROSCOPY WITH TRUCLEAR;  Surgeon: Azalia Bilis, MD;  Location: [place] [facility];  Service: Gynecology;  Laterality: N/A;  . FOREIGN REMOVED  AGE 63   PEANUT REMOVED FROM LUNG  . HERNIA REPAIR    . KNEE MASS EXCISION  1970'S    OSTEOCHONDROMA REMOVED  . myoma on right thigh  2002 (summer)  . PROCTOSCOPY  08/14/2011   Procedure: PROCTOSCOPY;  Surgeon: Adin Hector, MD;  [facility];  Service: General;  Laterality: N/A;  rigid    There were no vitals filed for this visit.  Subjective Assessment - 02/07/19 0930    Subjective  It's okay.    Currently in Pain?  Yes    Pain Score  2     Pain Location  Neck    Pain Orientation  Right    Pain Descriptors / Indicators  Aching    Pain Type  Chronic pain                       [facility] Adult PT Treatment/Exercise - 02/07/19 0001      Neck Exercises: Theraband   Horizontal ABduction  Green;20 reps      Neck Exercises: Standing   Other Standing Exercises  diagonals with green x 10 each; W's yellow x 15 - against the wall - foam roll behind back       Neck Exercises: Supine   Capital Flexion  20 reps;3 secs    Capital Flexion Limitations  10 reps with a slight lift - cues not to activate SMCs  Neck Exercises: Prone   Plank  POE neck diagonals x 10 each way      Lumbar Exercises: Standing   Row  20 reps   20#   Shoulder Extension  Strengthening;Power Tower;Both;20 reps;Limitations    Shoulder Extension Limitations  20      Neck Exercises: Stretches   Levator Stretch  Right;2 reps;30 seconds    Other Neck Stretches  SCM stretch 2x30 sec             PT Education - 02/07/19 1022    Education Details  HEP    Person(s) Educated  Patient    Methods  Explanation;Demonstration;Handout    Comprehension  Verbalized understanding;Returned demonstration       PT Short Term Goals - 01/24/19 0928      PT SHORT TERM GOAL #2   Title  pt will report 20% reduction in pain    Baseline  I think it is better, there are days where the pain is less but sometimes it comes back    Status  On-going        PT Long Term Goals - 01/03/19 1340      PT LONG TERM GOAL #1   Title  Pt will report 50% more ease with getting to sleep at night     Time  8    Period  Weeks    Status  New    Target Date  02/28/19      PT LONG TERM GOAL #2   Title  Pt will report 60% less pain at night after typical daily activities    Time  8    Period  Weeks    Status  New    Target Date  02/28/19      PT LONG TERM GOAL #3   Title  FOTO </= 36% limited    Time  8    Period  Weeks    Status  New    Target Date  02/28/19      PT LONG TERM GOAL #4   Title  ind with advanced HEP    Time  8    Period  Weeks    Status  New    Target Date  02/28/19      PT LONG TERM GOAL #5   Title  Pt will have at least 50 deg cervical extension in order to be able to use her eye drops without difficutly    Time  8    Period  Weeks    Status  New    Target Date  02/28/19            Plan - 02/07/19 1019    Clinical Impression Statement  Patient did well today with TE. She wanted to focus on exercise rather than STW. She had a good response to POE with neck diagonals. Cervical motion was assessed before and after and she had decreased pain and mild improvement in ROM afterwards. Exercise was issued for HEP.    PT Treatment/Interventions  ADLs/Self Care Home Management;Cryotherapy;Electrical Stimulation;Moist Heat;Traction;Therapeutic activities;Therapeutic exercise;Manual techniques;Patient/family education;Neuromuscular re-education;Dry needling;Taping    PT Next Visit Plan  Assess POE and continue to progress postural strengthening.    PT Home Exercise Plan  Access Code: [email or web address]    Consulted and Agree with Plan of Care  Patient       Patient will benefit from skilled therapeutic intervention in order to improve the following deficits and impairments:  Pain, Postural dysfunction, Impaired flexibility,  Decreased coordination, Decreased range of motion, Increased muscle spasms  Visit Diagnosis: Cervicalgia     Problem List Patient Active Problem List   Diagnosis Date Noted  . Multinodular goiter 02/19/2015  . IUD (intrauterine device) in  place 01/29/2014  . Fibroid, uterine 11/24/2013  . Menorrhagia, premenopausal 11/24/2013  . Personal history of colonic polyps 04/23/2011  . Diverticulitis of colon with perforation 04/20/2011  . Sjoegren syndrome 04/17/2011  . Asthma 04/17/2011   Madelyn Flavors PT 02/07/2019, 10:27 AM  [facility] [address] [place], [place], [address] Phone: [phone]   Fax:  [phone]  Name: ILLYRIA FILIPE MRN: [id number] Date of Birth: 06/27/1955

## 2019-02-14 ENCOUNTER — Encounter: Payer: Self-pay | Admitting: Physical Therapy

## 2019-02-14 ENCOUNTER — Ambulatory Visit: Payer: BC Managed Care – PPO | Admitting: Physical Therapy

## 2019-02-14 ENCOUNTER — Other Ambulatory Visit: Payer: Self-pay

## 2019-02-14 DIAGNOSIS — M542 Cervicalgia: Secondary | ICD-10-CM | POA: Diagnosis not present

## 2019-02-14 DIAGNOSIS — M62838 Other muscle spasm: Secondary | ICD-10-CM

## 2019-02-14 NOTE — Therapy (Signed)
[facility] [address], [place], [place], [address] Phone: [phone]   Fax:  [phone]  Physical Therapy Treatment  Patient Details  Name: Annette Gentry MRN: [id number] Date of Birth: 1955-07-05 Referring Provider (PT): Lujean Amel, MD   Encounter Date: 02/14/2019  PT End of Session - 02/14/19 0936    Visit Number  7    Date for PT Re-Evaluation  02/28/19    Authorization Type  BCBS    PT Start Time  0931    PT Stop Time  1012    PT Time Calculation (min)  41 min    Activity Tolerance  Patient tolerated treatment well    Behavior During Therapy  [facility] for tasks assessed/performed       Past Medical History:  Diagnosis Date  . Arthritis   . Asthma   . Blood transfusion    AS INFANT  . 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 Diverticulitis of colon with perforation 04/20/2011  . Heart murmur   . Medical history non-contributory   . Multinodular goiter   . Osteopenia   . Palpitations    CARDIAC STUDIES DONE 2009 - WAS TOLD THEY WERE NORMAL  . Personal history of colonic polyps 04/23/2011  . Rash, skin    AROUND STOMA  . Sjogren's syndrome [facility])     Past Surgical History:  Procedure Laterality Date  . COLON RESECTION  04/22/2011   Procedure: COLON RESECTION;  Surgeon: Adin Hector, MD;  [facility];  Service: General;  Laterality: N/A;  colon resection, drainage of abscess  . COLON SURGERY  04/2011,07/2011   colostomy take down  . COLOSTOMY  04/22/2011   Procedure: COLOSTOMY;  Surgeon: Adin Hector, MD;  [facility];  Service: General;  Laterality: N/A;  . DILATATION & CURETTAGE/HYSTEROSCOPY WITH TRUECLEAR N/A 12/19/2013   Procedure: DILATATION & CURETTAGE/HYSTEROSCOPY WITH TRUCLEAR;  Surgeon: Azalia Bilis, MD;  Location: [place] [facility];  Service: Gynecology;  Laterality: N/A;  . FOREIGN REMOVED  AGE 67   PEANUT REMOVED FROM LUNG  . HERNIA REPAIR    . KNEE MASS EXCISION  1970'S   OSTEOCHONDROMA REMOVED  . myoma on right thigh  2002 (summer)  . PROCTOSCOPY  08/14/2011   Procedure: PROCTOSCOPY;  Surgeon: Adin Hector, MD;  [facility];  Service: General;  Laterality: N/A;  rigid    There were no vitals filed for this visit.  Subjective Assessment - 02/14/19 0943    Subjective  It feels better but I am moving it less.  Not hurting currently    Currently in Pain?  No/denies                       [facility] Adult PT Treatment/Exercise - 02/14/19 0001      Neck Exercises: Theraband   Horizontal ABduction  Green;20 reps   supine   Other Theraband Exercises  supine soft foam roll behind neck - rotation and nodding to comfortable ROM      Neck Exercises: Prone   Plank  POE neck diagonals x 10 each way      Lumbar Exercises: Standing   Row  20 reps   25#   Shoulder Extension  Strengthening;Power Tower;Both;20 reps;Limitations    Shoulder Extension Limitations  25             PT Education - 02/14/19 1029    Education Details  Access Code: [email or web address]  Person(s) Educated  Patient    Methods  Explanation;Demonstration;Tactile cues;Verbal cues;Handout    Comprehension  Verbalized understanding;Returned demonstration       PT Short Term Goals - 02/14/19 0945      PT SHORT TERM GOAL #2   Title  pt will report 20% reduction in pain    Baseline  30% imporved    Status  Achieved        PT Long Term Goals - 02/14/19 0945      PT LONG TERM GOAL #1   Title  Pt will report 50% more ease with getting to sleep at night    Baseline  50%more ease    Status  Achieved      PT LONG TERM GOAL #2   Title  Pt will report 60% less pain at night after typical daily activities    Baseline  30% improved    Status  Partially Met      PT LONG TERM GOAL #3   Title  FOTO </= 36% limited    Status  On-going      PT LONG TERM GOAL #4   Title  ind with advanced HEP    Status  On-going            Plan - 02/14/19 1030    Clinical  Impression Statement  Pt did well with band exercises and were added to HEP.  Pt had no pain during treatment today and was able to progress resistance.  Cervical motion was assess before and after and she had no increased pain.  She will benefit from skilled PT to porgress strength and finalize HEP.    PT Treatment/Interventions  ADLs/Self Care Home Management;Cryotherapy;Electrical Stimulation;Moist Heat;Traction;Therapeutic activities;Therapeutic exercise;Manual techniques;Patient/family education;Neuromuscular re-education;Dry needling;Taping    PT Next Visit Plan  re-eval    PT Home Exercise Plan  Access Code: [email or web address]    Consulted and Agree with Plan of Care  Patient       Patient will benefit from skilled therapeutic intervention in order to improve the following deficits and impairments:  Pain, Postural dysfunction, Impaired flexibility, Decreased coordination, Decreased range of motion, Increased muscle spasms  Visit Diagnosis: Cervicalgia  Other muscle spasm     Problem List Patient Active Problem List   Diagnosis Date Noted  . Multinodular goiter 02/19/2015  . IUD (intrauterine device) in place 01/29/2014  . Fibroid, uterine 11/24/2013  . Menorrhagia, premenopausal 11/24/2013  . Personal history of colonic polyps 04/23/2011  . Diverticulitis of colon with perforation 04/20/2011  . Sjoegren syndrome 04/17/2011  . Asthma 04/17/2011    Jule Ser, PT 02/14/2019, 10:36 AM  [facility] [address], [place], [place], [address] Phone: [phone]   Fax:  [phone]  Name: Annette Gentry MRN: [id number] Date of Birth: November 05, 1955

## 2019-02-14 NOTE — Patient Instructions (Signed)
Access Code: [email or web address]  URL: [email or web address]  Date: 02/14/2019  Prepared by: Jari Favre   Exercises  Seated Cervical Rotation AROM - 10 reps - 1 sets - 1x daily - 7x weekly  Seated Cervical Sidebending AROM - 10 reps - 1 sets - 1x daily - 7x weekly  Seated Cervical Flexion AROM - 10 reps - 1 sets - 1x daily - 7x weekly  Seated Cervical Extension AROM - 10 reps - 1 sets - 1x daily - 7x weekly  Seated Thoracic Lumbar Extension - 10 reps - 1 sets - 5 sec hold - 1x daily - 7x weekly  Seated Thoracic Flexion and Rotation with Arms Crossed - 10 reps - 1 sets - 5 sec hold - 1x daily - 7x weekly  Sternocleidomastoid Stretch - 3 reps - 1 sets - 30 sec hold - 1x daily - 7x weekly  Supine Deep Neck Flexor Training - 10 reps - 2 sets - 3 sec hold - 1x daily - 7x weekly  Cervical Rotation Prone on Elbows - 10 reps - 1 sets - 2-3x daily - 7x weekly  Supine Shoulder Horizontal Abduction with Resistance - 10 reps - 3 sets - 1x daily - 7x weekly  Seated Shoulder W External Rotation on Swiss Ball - 10 reps - 3 sets - 1x daily - 7x weekly  Standing Shoulder Diagonal Horizontal Abduction 60/120 Degrees with Resistance - 10 reps - 3 sets - 1x daily - 7x weekly  Patient Education  Office Posture

## 2019-02-21 ENCOUNTER — Ambulatory Visit: Payer: BC Managed Care – PPO | Admitting: Physical Therapy

## 2019-02-21 ENCOUNTER — Encounter: Payer: Self-pay | Admitting: Physical Therapy

## 2019-02-21 ENCOUNTER — Other Ambulatory Visit: Payer: Self-pay

## 2019-02-21 DIAGNOSIS — M542 Cervicalgia: Secondary | ICD-10-CM

## 2019-02-21 DIAGNOSIS — M62838 Other muscle spasm: Secondary | ICD-10-CM

## 2019-02-21 NOTE — Therapy (Signed)
[facility] [address] [place], [place], [address] Phone: [phone]   Fax:  [phone]  Physical Therapy Treatment  Patient Details  Name: Annette Gentry MRN: [id number] Date of Birth: [birth year] Referring Provider (PT): Lujean Amel, MD   Encounter Date: 02/21/2019  PT End of Session - 02/21/19 0933    Visit Number  8    Date for PT Re-Evaluation  02/28/19    Authorization Type  BCBS    PT Start Time  0930    PT Stop Time  1015    PT Time Calculation (min)  45 min    Activity Tolerance  Patient tolerated treatment well    Behavior During Therapy  [facility] for tasks assessed/performed       Past Medical History:  Diagnosis Date  . Arthritis   . Asthma   . Blood transfusion    AS INFANT  . 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 Diverticulitis of colon with perforation 04/20/2011  . Heart murmur   . Medical history non-contributory   . Multinodular goiter   . Osteopenia   . Palpitations    CARDIAC STUDIES DONE 2009 - WAS TOLD THEY WERE NORMAL  . Personal history of colonic polyps 04/23/2011  . Rash, skin    AROUND STOMA  . Sjogren's syndrome [facility])     Past Surgical History:  Procedure Laterality Date  . COLON RESECTION  04/22/2011   Procedure: COLON RESECTION;  Surgeon: Adin Hector, MD;  Location: [facility];  Service: General;  Laterality: N/A;  colon resection, drainage of abscess  . COLON SURGERY  04/2011,07/2011   colostomy take down  . COLOSTOMY  04/22/2011   Procedure: COLOSTOMY;  Surgeon: Adin Hector, MD;  Location: [facility];  Service: General;  Laterality: N/A;  . DILATATION & CURETTAGE/HYSTEROSCOPY WITH TRUECLEAR N/A 12/19/2013   Procedure: DILATATION & CURETTAGE/HYSTEROSCOPY WITH TRUCLEAR;  Surgeon: Azalia Bilis, MD;  Location: [facility];  Service: Gynecology;  Laterality: N/A;  . FOREIGN REMOVED  AGE 28   PEANUT REMOVED FROM LUNG  . HERNIA REPAIR    . KNEE MASS EXCISION  1970'S   OSTEOCHONDROMA REMOVED  . myoma on right thigh  2002 (summer)  . PROCTOSCOPY  08/14/2011   Procedure: PROCTOSCOPY;  Surgeon: Adin Hector, MD;  Location: [facility];  Service: General;  Laterality: N/A;  rigid    There were no vitals filed for this visit.  Subjective Assessment - 02/21/19 0935    Subjective  It feels okay.  It is still stiff and not moving as much.    Currently in Pain?  No/denies                       [facility] Adult PT Treatment/Exercise - 02/21/19 0001      Neck Exercises: Prone   W Back  20 reps    W Back Limitations  on green ball    Upper Extremity Flexion with Stabilization  20 reps    UE Flexion with Stabilization Limitations  on green ball    Other Prone Exercise  T on green ball - 20x      Lumbar Exercises: Standing   Row  20 reps   25#   Shoulder Extension  Strengthening;Power Tower;Both;20 reps;Limitations    Shoulder Extension Limitations  25      Modalities   Modalities  Traction      Traction   Type of Traction  Cervical  Min (lbs)  5    Max (lbs)  13    Hold Time  60    Rest Time  10    Time  15 min               PT Short Term Goals - 02/14/19 0945      PT SHORT TERM GOAL #2   Title  pt will report 20% reduction in pain    Baseline  30% imporved    Status  Achieved        PT Long Term Goals - 02/14/19 0945      PT LONG TERM GOAL #1   Title  Pt will report 50% more ease with getting to sleep at night    Baseline  50%more ease    Status  Achieved      PT LONG TERM GOAL #2   Title  Pt will report 60% less pain at night after typical daily activities    Baseline  30% improved    Status  Partially Met      PT LONG TERM GOAL #3   Title  FOTO </= 36% limited    Status  On-going      PT LONG TERM GOAL #4   Title  ind with advanced HEP    Status  On-going            Plan - 02/21/19 0956    Clinical Impression Statement  Pt is maintaining current functional level with stretches and exercises at  home.  Pt continues to have some twinges with certain positions.  Pt may benefit from cervical traction to further improve ROM with decreased pain.  Pt tolerated traction well.  She will benefit from continues strengthening and modalities for improved pain and ROM.    PT Treatment/Interventions  ADLs/Self Care Home Management;Cryotherapy;Electrical Stimulation;Moist Heat;Traction;Therapeutic activities;Therapeutic exercise;Manual techniques;Patient/family education;Neuromuscular re-education;Dry needling;Taping    PT Next Visit Plan  re-eval, FOTO, f/u on response to traction    PT Home Exercise Plan  Access Code: [email or web address]    Consulted and Agree with Plan of Care  Patient       Patient will benefit from skilled therapeutic intervention in order to improve the following deficits and impairments:  Pain, Postural dysfunction, Impaired flexibility, Decreased coordination, Decreased range of motion, Increased muscle spasms  Visit Diagnosis: Cervicalgia  Other muscle spasm     Problem List Patient Active Problem List   Diagnosis Date Noted  . Multinodular goiter 02/19/2015  . IUD (intrauterine device) in place 01/29/2014  . Fibroid, uterine 11/24/2013  . Menorrhagia, premenopausal 11/24/2013  . Personal history of colonic polyps 04/23/2011  . Diverticulitis of colon with perforation 04/20/2011  . Sjoegren syndrome 04/17/2011  . Asthma 04/17/2011    Jule Ser, PT 02/21/2019, 10:00 AM  [facility] [address], [place], [place], [address] Phone: [phone]   Fax:  [phone]  Name: Annette Gentry MRN: [id number] Date of Birth: 1955-09-09

## 2019-02-28 ENCOUNTER — Encounter: Payer: Self-pay | Admitting: Physical Therapy

## 2019-02-28 ENCOUNTER — Other Ambulatory Visit: Payer: Self-pay

## 2019-02-28 ENCOUNTER — Ambulatory Visit: Payer: BC Managed Care – PPO | Admitting: Physical Therapy

## 2019-02-28 DIAGNOSIS — M542 Cervicalgia: Secondary | ICD-10-CM

## 2019-02-28 DIAGNOSIS — M62838 Other muscle spasm: Secondary | ICD-10-CM

## 2019-02-28 NOTE — Therapy (Signed)
[facility] [address], [place], [place], [address] Phone: [phone]   Fax:  [phone]  Physical Therapy Treatment  Patient Details  Name: Annette Gentry MRN: [id number] Date of Birth: [birth year] Referring Provider (PT): Lujean Amel, MD   Encounter Date: 02/28/2019  PT End of Session - 02/28/19 0928    Visit Number  9    Date for PT Re-Evaluation  02/28/19    Authorization Type  BCBS    PT Start Time  0928    PT Stop Time  1025    PT Time Calculation (min)  57 min    Activity Tolerance  Patient tolerated treatment well    Behavior During Therapy  [facility] for tasks assessed/performed       Past Medical History:  Diagnosis Date  . Arthritis   . Asthma   . Blood transfusion    AS INFANT  . 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 Diverticulitis of colon with perforation 04/20/2011  . Heart murmur   . Medical history non-contributory   . Multinodular goiter   . Osteopenia   . Palpitations    CARDIAC STUDIES DONE 2009 - WAS TOLD THEY WERE NORMAL  . Personal history of colonic polyps 04/23/2011  . Rash, skin    AROUND STOMA  . Sjogren's syndrome [facility])     Past Surgical History:  Procedure Laterality Date  . COLON RESECTION  04/22/2011   Procedure: COLON RESECTION;  Surgeon: Adin Hector, MD;  Location: [facility];  Service: General;  Laterality: N/A;  colon resection, drainage of abscess  . COLON SURGERY  04/2011,07/2011   colostomy take down  . COLOSTOMY  04/22/2011   Procedure: COLOSTOMY;  Surgeon: Adin Hector, MD;  [facility];  Service: General;  Laterality: N/A;  . DILATATION & CURETTAGE/HYSTEROSCOPY WITH TRUECLEAR N/A 12/19/2013   Procedure: DILATATION & CURETTAGE/HYSTEROSCOPY WITH TRUCLEAR;  Surgeon: Azalia Bilis, MD;  Location: [facility];  Service: Gynecology;  Laterality: N/A;  . FOREIGN REMOVED  AGE 22   PEANUT REMOVED FROM LUNG  . HERNIA REPAIR    . KNEE MASS EXCISION  1970'S   OSTEOCHONDROMA REMOVED  . myoma on right thigh  2002 (summer)  . PROCTOSCOPY  08/14/2011   Procedure: PROCTOSCOPY;  Surgeon: Adin Hector, MD;  [facility];  Service: General;  Laterality: N/A;  rigid    There were no vitals filed for this visit.  Subjective Assessment - 02/28/19 0937    Subjective  It felt okay after the traction but not a lot of difference.  Still very noticeable late afternoon and evening. No pain in the morning    How long can you sit comfortably?  really gets noticeable late afternoon    Currently in Pain?  No/denies         [facility] PT Assessment - 02/28/19 0001      Observation/Other Assessments   Focus on Therapeutic Outcomes (FOTO)   40% limited improved from 46%                   [facility] Adult PT Treatment/Exercise - 02/28/19 0001      Neck Exercises: Supine   Cervical Isometrics  Flexion;5 secs;10 reps      Neck Exercises: Prone   W Back  20 reps    W Back Limitations  on green ball    Upper Extremity Flexion with Stabilization  20 reps    UE Flexion with Stabilization Limitations  on green ball    Other Prone Exercise  T on green ball - 20x      Lumbar Exercises: Aerobic   UBE (Upper Arm Bike)  L1 3x3 fwd/back - PT present for status update      Traction   Type of Traction  Cervical    Min (lbs)  5    Max (lbs)  14    Hold Time  60    Rest Time  10    Time  15 min      Manual Therapy   Manual Therapy  Manual Traction;Joint mobilization    Joint Mobilization  grade I-II C3/4 lateral glide to the left    Myofascial Release  suboccipitals, cervical parapsinals, upper trap Rt, thoracic parapsinals Rt side mostly               PT Short Term Goals - 02/14/19 0945      PT SHORT TERM GOAL #2   Title  pt will report 20% reduction in pain    Baseline  30% imporved    Status  Achieved        PT Long Term Goals - 02/28/19 0940      PT LONG TERM GOAL #1   Title  Pt will report 50% more ease with getting to sleep  at night    Status  Achieved      PT LONG TERM GOAL #2   Title  Pt will report 60% less pain at night after typical daily activities    Baseline  30% improved    Status  Partially Met      PT LONG TERM GOAL #3   Title  FOTO </= 36% limited    Baseline  40%    Status  Partially Met      PT LONG TERM GOAL #4   Title  ind with advanced HEP    Status  Achieved      PT LONG TERM GOAL #5   Title  Pt will have at least 50 deg cervical extension in order to be able to use her eye drops without difficutly    Status  Not Met            Plan - 02/28/19 1020    Clinical Impression Statement  Pt has made a moderate amount of progress overall.  She has met several goals.  She is 40% limited based on her FOTO score not quite reaching her goal of 36% limited.  Pt will be discharged from skilled PT at this time since we attempted strengthening, dry needling, soft tissue mobs, and traction as part of her treatments and she reports she has reached a plateau at this time.  She will most likely return to the MD to re-assess. She was recommended that she may benefit from chiropractic therapy if her MD is in agreeance.    PT Treatment/Interventions  ADLs/Self Care Home Management;Cryotherapy;Electrical Stimulation;Moist Heat;Traction;Therapeutic activities;Therapeutic exercise;Manual techniques;Patient/family education;Neuromuscular re-education;Dry needling;Taping    PT Home Exercise Plan  Access Code: [email or web address]    Consulted and Agree with Plan of Care  Patient       Patient will benefit from skilled therapeutic intervention in order to improve the following deficits and impairments:  Pain, Postural dysfunction, Impaired flexibility, Decreased coordination, Decreased range of motion, Increased muscle spasms  Visit Diagnosis: Cervicalgia  Other muscle spasm     Problem List Patient Active Problem List   Diagnosis Date Noted  . Multinodular goiter 02/19/2015  .   IUD (intrauterine device) in  place 01/29/2014  . Fibroid, uterine 11/24/2013  . Menorrhagia, premenopausal 11/24/2013  . Personal history of colonic polyps 04/23/2011  . Diverticulitis of colon with perforation 04/20/2011  . Sjoegren syndrome 04/17/2011  . Asthma 04/17/2011    Jule Ser, PT 02/28/2019, 10:33 AM  [facility] [address] [place], [place], [address] Phone: [phone]   Fax:  [phone]  Name: Annette Gentry MRN: [id number] Date of Birth: 05-Feb-1956  PHYSICAL THERAPY DISCHARGE SUMMARY  Visits from Start of Care: 9  Current functional level related to goals / functional outcomes: See above goals   Remaining deficits: See above   Education / Equipment: HEP  Plan: Patient agrees to discharge.  Patient goals were partially met. Patient is being discharged due to lack of progress.  ?????    Pt has made progress in PT, but was slow and has hit a plateau. Pt may return to MD to get further assessment.  PT suggested possibly chiropractor, seems like C3-4 is shifted to the Rt and may need some kind of adjustment, but recommended following up with MD first.  Gustavus Bryant, PT 02/28/19 10:41 AM

## 2019-03-03 ENCOUNTER — Other Ambulatory Visit: Payer: Self-pay | Admitting: Obstetrics and Gynecology

## 2019-03-03 DIAGNOSIS — Z1231 Encounter for screening mammogram for malignant neoplasm of breast: Secondary | ICD-10-CM

## 2019-03-21 NOTE — Progress Notes (Signed)
63 y.o. G57P1001 Married White or Caucasian Not Hispanic or Latino female here for annual exam.  No vaginal bleeding. No dyspareunia.   She has noticed her cervix is close to the opening of her vagina in the shower. She occasionally feels a bulge when she is walking. No urinary c/o, feels she is emptying her bladder okay, no leakage, no increased frequency  Followed by Dr Loanne Drilling for thyroid nodules.     Patient's last menstrual period was 12/29/2013.          Sexually active: Yes.    The current method of family planning is post menopausal status.    Exercising: Yes.    walking Smoker:  no  Health Maintenance: Pap:03/10/2018 WNL NEG HPV, 01-24-15 WNL NEG HR HPV History of abnormal Pap:Yes in her teens MMG: 01/25/2018 Birads 1 negative Colonoscopy:09-24-11 repeat in 10 yrs [id number] per patient, with her primary TDaP:Unsure, patient will check with PCP Gardasil:N/A   reports that she quit smoking about 31 years ago. She has never used smokeless tobacco. She reports current alcohol use of about 14.0 standard drinks of alcohol per week. She reports that she does not use drugs.  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 Husband is in the Physics department. She has a 18year old daughter. 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 Arthritis   . Asthma   . Blood transfusion    AS INFANT  . 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 Diverticulitis of colon with perforation 04/20/2011  . Heart murmur   . Medical history non-contributory   . Multinodular goiter   . Osteopenia   . Palpitations    CARDIAC STUDIES DONE 2009 - WAS TOLD THEY WERE NORMAL  . Personal history of colonic polyps 04/23/2011  . Rash, skin    AROUND STOMA  . Sjogren's syndrome [facility])     Past Surgical History:  Procedure Laterality Date  . COLON RESECTION  04/22/2011   Procedure:  COLON RESECTION;  Surgeon: Adin Hector, MD;  Location: [facility];  Service: General;  Laterality: N/A;  colon resection, drainage of abscess  . COLON SURGERY  04/2011,07/2011   colostomy take down  . COLOSTOMY  04/22/2011   Procedure: COLOSTOMY;  Surgeon: Adin Hector, MD;  [facility]: [facility];  Service: General;  Laterality: N/A;  . DILATATION & CURETTAGE/HYSTEROSCOPY WITH TRUECLEAR N/A 12/19/2013   Procedure: DILATATION & CURETTAGE/HYSTEROSCOPY WITH TRUCLEAR;  Surgeon: Azalia Bilis, MD;  Location: [place];  Service: Gynecology;  Laterality: N/A;  . FOREIGN REMOVED  AGE 28   PEANUT REMOVED FROM LUNG  . HERNIA REPAIR    . KNEE MASS EXCISION  1970'S   OSTEOCHONDROMA REMOVED  . myoma on right thigh  2002 (summer)  . PROCTOSCOPY  08/14/2011   Procedure: PROCTOSCOPY;  Surgeon: Adin Hector, MD;  [facility];  Service: General;  Laterality: N/A;  rigid    Current Outpatient Medications  Medication Sig Dispense Refill  . Cholecalciferol (VITAMIN D) 2000 UNITS tablet Take 1,000 Units by mouth daily.     . fluticasone (FLONASE) 50 MCG/ACT nasal spray Place 2 sprays into both nostrils daily.    . methylcellulose (ARTIFICIAL TEARS) 1 % ophthalmic solution Place 1 drop into both eyes daily.    . Multiple Vitamins-Minerals (MULTIVITAMINS THER. W/MINERALS) TABS Take 1 tablet by mouth daily.      [name]  psyllium (METAMUCIL) 58.6 % packet Take 1 packet by mouth daily.     No current facility-administered medications for this visit.     Family History  Problem Relation Age of Onset  . Hyperlipidemia Mother   . Hyperlipidemia Father   . Parkinson's disease Father   . Hyperlipidemia Sister   . Hyperlipidemia Brother   . Thyroid disease Neg Hx   . Breast cancer Neg Hx     Review of Systems  Constitutional: Negative.   HENT: Negative.   Eyes: Negative.   Respiratory: Negative.   Cardiovascular: Negative.   Gastrointestinal: Negative.   Endocrine: Negative.   Genitourinary: Negative.    Musculoskeletal: Negative.   Skin: Negative.   Allergic/Immunologic: Negative.   Neurological: Negative.   Hematological: Negative.   Psychiatric/Behavioral: Negative.     Exam:   BP 106/60 (BP Location: Right Arm, Patient Position: Sitting, Cuff Size: Normal)   Pulse 80   Temp (!) 97.1 F (36.2 C) (Skin)   Ht 5\' 7"  (1.702 m)   Wt 142 lb (64.4 kg)   LMP 12/29/2013   BMI 22.24 kg/m   Weight change: @WEIGHTCHANGE @ Height:   Height: 5\' 7"  (170.2 cm)  Ht Readings from Last 3 Encounters:  03/23/19 5\' 7"  (1.702 m)  03/10/18 5\' 7"  (1.702 m)  02/11/18 5\' 7"  (1.702 m)    General appearance: alert, cooperative and appears stated age Head: Normocephalic, without obvious abnormality, atraumatic Neck: no adenopathy, supple, symmetrical, trachea midline and thyroid normal to inspection and palpation Lungs: clear to auscultation bilaterally Cardiovascular: regular rate and rhythm Breasts: normal appearance, no masses or tenderness Abdomen: soft, non-tender; non distended,  no masses,  no organomegaly Extremities: extremities normal, atraumatic, no cyanosis or edema Skin: Skin color, texture, turgor normal. No rashes or lesions Lymph nodes: Cervical, supraclavicular, and axillary nodes normal. No abnormal inguinal nodes palpated Neurologic: Grossly normal   Pelvic: External genitalia:  no lesions              Urethra:  normal appearing urethra with no masses, tenderness or lesions              Bartholins and Skenes: normal                 Vagina: normal appearing vagina with normal color and discharge, no lesions              Cervix: no lesions   With standing and valsalva small grade 2 rectocele, no other significant prolapse noted.                Bimanual Exam:  Uterus:  retroverted, mobile, 6-8 week sized (stable), not tender              Adnexa: no mass, fullness, tenderness               Rectovaginal: Confirms               Anus:  normal sphincter tone, no lesions  Chaperone  was present for exam.  A:  Well Woman with normal exam  Small grade 2 rectocele, suspect prolapse is worse when she is active  P:   No pap this year  Labs with primary  Discussed prolapse, information given, discussed returning at the end of a busy day for repeat exam and possible pessary fitting.   Mammogram scheduled  Colonoscopy UTD  She will check with her primary on her TDAP and DEXA  Desires covid antibody test

## 2019-03-23 ENCOUNTER — Encounter: Payer: Self-pay | Admitting: Obstetrics and Gynecology

## 2019-03-23 ENCOUNTER — Other Ambulatory Visit: Payer: Self-pay

## 2019-03-23 ENCOUNTER — Ambulatory Visit: Payer: BC Managed Care – PPO | Admitting: Obstetrics and Gynecology

## 2019-03-23 VITALS — BP 106/60 | HR 80 | Temp 97.1°F | Ht 67.0 in | Wt 142.0 lb

## 2019-03-23 DIAGNOSIS — N816 Rectocele: Secondary | ICD-10-CM

## 2019-03-23 DIAGNOSIS — D259 Leiomyoma of uterus, unspecified: Secondary | ICD-10-CM | POA: Diagnosis not present

## 2019-03-23 DIAGNOSIS — Z01419 Encounter for gynecological examination (general) (routine) without abnormal findings: Secondary | ICD-10-CM | POA: Diagnosis not present

## 2019-03-23 DIAGNOSIS — Z20828 Contact with and (suspected) exposure to other viral communicable diseases: Secondary | ICD-10-CM | POA: Diagnosis not present

## 2019-03-23 NOTE — Patient Instructions (Addendum)
Check when you are do for your bone density  Check on your TDAP  EXERCISE AND DIET:  We recommended that you start or continue a regular exercise program for good health. Regular exercise means any activity that makes your heart beat faster and makes you sweat.  We recommend exercising at least 30 minutes per day at least 3 days a week, preferably 4 or 5.  We also recommend a diet low in fat and sugar.  Inactivity, poor dietary choices and obesity can cause diabetes, heart attack, stroke, and kidney damage, among others.    ALCOHOL AND SMOKING:  Women should limit their alcohol intake to no more than 7 drinks/beers/glasses of wine (combined, not each!) per week. Moderation of alcohol intake to this level decreases your risk of breast cancer and liver damage. And of course, no recreational drugs are part of a healthy lifestyle.  And absolutely no smoking or even second hand smoke. Most people know smoking can cause heart and lung diseases, but did you know it also contributes to weakening of your bones? Aging of your skin?  Yellowing of your teeth and nails?  CALCIUM AND VITAMIN D:  Adequate intake of calcium and Vitamin D are recommended.  The recommendations for exact amounts of these supplements seem to change often, but generally speaking 1,200 mg of calcium (between diet and supplement) and 800 units of Vitamin D per day seems prudent. Certain women may benefit from higher intake of Vitamin D.  If you are among these women, your doctor will have told you during your visit.    PAP SMEARS:  Pap smears, to check for cervical cancer or precancers,  have traditionally been done yearly, although recent scientific advances have shown that most women can have pap smears less often.  However, every woman still should have a physical exam from her gynecologist every year. It will include a breast check, inspection of the vulva and vagina to check for abnormal growths or skin changes, a visual exam of the cervix,  and then an exam to evaluate the size and shape of the uterus and ovaries.  And after 62 years of age, a rectal exam is indicated to check for rectal cancers. We will also provide age appropriate advice regarding health maintenance, like when you should have certain vaccines, screening for sexually transmitted diseases, bone density testing, colonoscopy, mammograms, etc.   MAMMOGRAMS:  All women over 61 years old should have a yearly mammogram. Many facilities now offer a "3D" mammogram, which may cost around $50 extra out of pocket. If possible,  we recommend you accept the option to have the 3D mammogram performed.  It both reduces the number of women who will be called back for extra views which then turn out to be normal, and it is better than the routine mammogram at detecting truly abnormal areas.    COLON CANCER SCREENING: Now recommend starting at age 54. At this time colonoscopy is not covered for routine screening until 50. There are take home tests that can be done between 45-49.   COLONOSCOPY:  Colonoscopy to screen for colon cancer is recommended for all women at age 70.  We know, you hate the idea of the prep.  We agree, BUT, having colon cancer and not knowing it is worse!!  Colon cancer so often starts as a polyp that can be seen and removed at colonscopy, which can quite literally save your life!  And if your first colonoscopy is normal and you have  no family history of colon cancer, most women don't have to have it again for 10 years.  Once every ten years, you can do something that may end up saving your life, right?  We will be happy to help you get it scheduled when you are ready.  Be sure to check your insurance coverage so you understand how much it will cost.  It may be covered as a preventative service at no cost, but you should check your particular policy.      Breast Self-Awareness Breast self-awareness means being familiar with how your breasts look and feel. It involves  checking your breasts regularly and reporting any changes to your health care provider. Practicing breast self-awareness is important. A change in your breasts can be a sign of a serious medical problem. Being familiar with how your breasts look and feel allows you to find any problems early, when treatment is more likely to be successful. All women should practice breast self-awareness, including women who have had breast implants. How to do a breast self-exam One way to learn what is normal for your breasts and whether your breasts are changing is to do a breast self-exam. To do a breast self-exam: Look for Changes  1. Remove all the clothing above your waist. 2. Stand in front of a mirror in a room with good lighting. 3. Put your hands on your hips. 4. Push your hands firmly downward. 5. Compare your breasts in the mirror. Look for differences between them (asymmetry), such as: ? Differences in shape. ? Differences in size. ? Puckers, dips, and bumps in one breast and not the other. 6. Look at each breast for changes in your skin, such as: ? Redness. ? Scaly areas. 7. Look for changes in your nipples, such as: ? Discharge. ? Bleeding. ? Dimpling. ? Redness. ? A change in position. Feel for Changes Carefully feel your breasts for lumps and changes. It is best to do this while lying on your back on the floor and again while sitting or standing in the shower or tub with soapy water on your skin. Feel each breast in the following way:  Place the arm on the side of the breast you are examining above your head.  Feel your breast with the other hand.  Start in the nipple area and make  inch (2 cm) overlapping circles to feel your breast. Use the pads of your three middle fingers to do this. Apply light pressure, then medium pressure, then firm pressure. The light pressure will allow you to feel the tissue closest to the skin. The medium pressure will allow you to feel the tissue that is a  little deeper. The firm pressure will allow you to feel the tissue close to the ribs.  Continue the overlapping circles, moving downward over the breast until you feel your ribs below your breast.  Move one finger-width toward the center of the body. Continue to use the  inch (2 cm) overlapping circles to feel your breast as you move slowly up toward your collarbone.  Continue the up and down exam using all three pressures until you reach your armpit.  Write Down What You Find  Write down what is normal for each breast and any changes that you find. Keep a written record with breast changes or normal findings for each breast. By writing this information down, you do not need to depend only on memory for size, tenderness, or location. Write down where you are in  your menstrual cycle, if you are still menstruating. If you are having trouble noticing differences in your breasts, do not get discouraged. With time you will become more familiar with the variations in your breasts and more comfortable with the exam. How often should I examine my breasts? Examine your breasts every month. If you are breastfeeding, the best time to examine your breasts is after a feeding or after using a breast pump. If you menstruate, the best time to examine your breasts is 5-7 days after your period is over. During your period, your breasts are lumpier, and it may be more difficult to notice changes. When should I see my health care provider? See your health care provider if you notice:  A change in shape or size of your breasts or nipples.  A change in the skin of your breast or nipples, such as a reddened or scaly area.  Unusual discharge from your nipples.  A lump or thick area that was not there before.  Pain in your breasts.  Anything that concerns you.  About Rectocele  Overview  A rectocele is a type of hernia which causes different degrees of bulging of the rectal tissues into the vaginal wall.   You may even notice that it presses against the vaginal wall so much that some vaginal tissues droop outside of the opening of your vagina.  Causes of Rectocele  The most common cause is childbirth.  The muscles and ligaments in the pelvis that hold up and support the female organs and vagina become stretched and weakened during labor and delivery.  The more babies you have, the more the support tissues are stretched and weakened.  Not everyone who has a baby will develop a rectocele.  Some women have stronger supporting tissue in the pelvis and may not have as much of a problem as others.  Women who have a Cesarean section usually do not get rectocele's unless they pushed a long time prior to the cesarean delivery.  Other conditions that can cause a rectocele include chronic constipation, a chronic cough, a lot of heavy lifting, and obesity.  Older women may have this problem because the loss of female hormones causes the vaginal tissue to become weaker.  Symptoms  There may not be any symptoms.  If you do have symptoms, they may include:  Pelvic pressure in the rectal area  Protrusion of the lower part of the vagina through the opening of the vagina  Constipation and trapping of the stool, making it difficult to have a bowel movement.  In severe cases, you may have to press on the lower part of your vagina to help push the stool out of you rectum.  This is called splinting to empty.  Diagnosing Rectocele  Your health care provider will ask about your symptoms and perform a pelvic exam.  S/he will ask you to bear down, pushing like you are having a bowel movement so as to see how far the lower part of the vagina protrudes into the vagina and possible outside of the vagina.  Your provider will also ask you to contract the muscles of your pelvis (like you are stopping the stream in the middle of urinating) to determine the strength of your pelvic muscles.  Your provider may also do a rectal  exam.  Treatment Options  If you do not have any symptoms, no treatment may be necessary.  Other treatment options include:  Pelvic floor exercises: Contracting the muscles in your genital  area may help strengthen your muscles and support the organs.  Be sure to get proper exercise instruction from you physical therapist.  A pessary (removealbe pelvic support device) sometimes helps rectocele symptoms.  Surgery: Surgical repair may be necessary. In some cases the uterus may need to be taken out ( a hysterectomy) as well.  There are many types of surgery for pelvic support problems.  Look for physicians who specialize in repair procedures.  You can take care of yourself by:  Treating and preventing constipation  Avoiding heavy lifting, and lifting correctly (with your legs, not with you waist or back)  Treating a chronic cough or bronchitis  Not smoking  avoiding too much weight gain  Doing pelvic floor exercises   2007, [employer] Doc.33

## 2019-03-25 LAB — SAR COV2 SEROLOGY (COVID19)AB(IGG),IA

## 2019-03-25 LAB — EUROIMMUN SARS-COV-2 AB, IGG: Euroimmun SARS-CoV-2 Ab, IgG: NEGATIVE

## 2019-03-31 ENCOUNTER — Ambulatory Visit
Admission: RE | Admit: 2019-03-31 | Discharge: 2019-03-31 | Disposition: A | Discharge status: Home / Self Care | Payer: BC Managed Care – PPO | Source: Ambulatory Visit | Attending: Obstetrics and Gynecology | Admitting: Obstetrics and Gynecology

## 2019-03-31 ENCOUNTER — Other Ambulatory Visit: Payer: Self-pay

## 2019-03-31 DIAGNOSIS — Z1231 Encounter for screening mammogram for malignant neoplasm of breast: Secondary | ICD-10-CM

## 2019-04-19 ENCOUNTER — Ambulatory Visit: Payer: BC Managed Care – PPO | Admitting: Sports Medicine

## 2019-04-26 ENCOUNTER — Other Ambulatory Visit: Payer: Self-pay

## 2019-04-26 ENCOUNTER — Encounter: Payer: Self-pay | Admitting: Sports Medicine

## 2019-04-26 ENCOUNTER — Ambulatory Visit: Payer: BC Managed Care – PPO | Admitting: Sports Medicine

## 2019-04-26 VITALS — BP 102/56 | Ht 67.0 in | Wt 140.0 lb

## 2019-04-26 DIAGNOSIS — M542 Cervicalgia: Secondary | ICD-10-CM | POA: Diagnosis not present

## 2019-04-26 MED ORDER — METHOCARBAMOL 500 MG PO TABS: 500.0000 mg | ORAL_TABLET | Freq: Two times a day (BID) | ORAL | 0 refills | Status: DC

## 2019-04-26 NOTE — Progress Notes (Addendum)
[facility] [address] 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  She states that has been present for multiple years, but since August has gradually worsened.  She saw her PCP in August and had XR and was sent to 8 weeks of PT that addressed her mobility, but not her pain.  Aggravating factors include turning her head to the right, flexing her head forward and bending her neck to the right and left.  It is alleviated with rest and anti-inflammatory.  She denies any radiating symptoms down her arm.  She is not taking medication regularly.  Past Medical History:  Diagnosis Date  . Arthritis   . Asthma   . Blood transfusion    AS INFANT  . 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 Diverticulitis of colon with perforation 04/20/2011  . Heart murmur   . Medical history non-contributory   . Multinodular goiter   . Osteopenia   . Palpitations    CARDIAC STUDIES DONE 2009 - WAS TOLD THEY WERE NORMAL  . Personal history of colonic polyps 04/23/2011  . Rash, skin    AROUND STOMA  . Sjogren's syndrome ([place])     Current Outpatient Medications on File Prior to Visit  Medication Sig Dispense Refill  . Cholecalciferol (VITAMIN D) 2000 UNITS tablet Take 1,000 Units by mouth daily.     . fluticasone (FLONASE) 50 MCG/ACT nasal spray Place 2 sprays into both nostrils daily.    . methylcellulose (ARTIFICIAL TEARS) 1 % ophthalmic solution Place 1 drop into both eyes daily.    . Multiple Vitamins-Minerals (MULTIVITAMINS THER. W/MINERALS) TABS Take 1 tablet by mouth daily.      . psyllium (METAMUCIL) 58.6 % packet Take 1 packet by mouth daily.     No current facility-administered  medications on file prior to visit.     Past Surgical History:  Procedure Laterality Date  . COLON RESECTION  04/22/2011   Procedure: COLON RESECTION;  Surgeon: Adin Hector, MD;  Location: [facility];  Service: General;  Laterality: N/A;  colon resection, drainage of abscess  . COLON SURGERY  04/2011,07/2011   colostomy take down  . COLOSTOMY  04/22/2011   Procedure: COLOSTOMY;  Surgeon: Adin Hector, MD;  Location: [facility];  Service: General;  Laterality: N/A;  . DILATATION & CURETTAGE/HYSTEROSCOPY WITH TRUECLEAR N/A 12/19/2013   Procedure: DILATATION & CURETTAGE/HYSTEROSCOPY WITH TRUCLEAR;  Surgeon: Azalia Bilis, MD;  Location: [facility];  Service: Gynecology;  Laterality: N/A;  . FOREIGN REMOVED  AGE 64   PEANUT REMOVED FROM LUNG  . HERNIA REPAIR    . KNEE MASS EXCISION  1970'S   OSTEOCHONDROMA REMOVED  . myoma on right thigh  2002 (summer)  . PROCTOSCOPY  08/14/2011   Procedure: PROCTOSCOPY;  Surgeon: Adin Hector, MD;  Location: [facility];  Service: General;  Laterality: N/A;  rigid    Allergies  Allergen Reactions  . Clindamycin Hives  . Codeine Nausea Only  . Polyethyl Glycol-Propyl Glycol Other (See Comments)    Itching and redness     Social History   Socioeconomic History  . Marital status: Married    Spouse name: Not on file  . Number of children: Not on file  .  Years of education: Not on file  . Highest education level: Not on file  Occupational History  . Not on file  Social Needs  . Financial resource strain: Not on file  . Food insecurity    Worry: Not on file    Inability: Not on file  . Transportation needs    Medical: Not on file    Non-medical: Not on file  Tobacco Use  . Smoking status: Former Smoker    Quit date: 08/04/1987    Years since quitting: 31.7  . Smokeless tobacco: Never Used  Substance and Sexual Activity  . Alcohol use: Yes    Alcohol/week: 14.0 standard drinks    Types: 14 Glasses of wine per week  . Drug use: No  . Sexual  activity: Yes    Partners: Male    Birth control/protection: Post-menopausal  Lifestyle  . Physical activity    Days per week: Not on file    Minutes per session: Not on file  . Stress: Not on file  Relationships  . Social Herbalist on phone: Not on file    Gets together: Not on file    Attends religious service: Not on file    Active member of club or organization: Not on file    Attends meetings of clubs or organizations: Not on file    Relationship status: Not on file  . Intimate partner violence    Fear of current or ex partner: Not on file    Emotionally abused: Not on file    Physically abused: Not on file    Forced sexual activity: Not on file  Other Topics Concern  . Not on file  Social History Narrative  . Not on file    Family History  Problem Relation Age of Onset  . Hyperlipidemia Mother   . Hyperlipidemia Father   . Parkinson's disease Father   . Hyperlipidemia Sister   . Hyperlipidemia Brother   . Thyroid disease Neg Hx   . Breast cancer Neg Hx     BP (!) 102/56   Ht 5\' 7"  (1.702 m)   Wt 140 lb (63.5 kg)   LMP 12/29/2013   BMI 21.93 kg/m   ROS:  See HPI CONST: no F/C, no malaise, no fatigue MSK: See above NEURO: no numbness/tingling, no weakness SKIN: no rash, no lesions HEME: no bleeding, no bruising, no erythema  Objective: GEN: Alert and oriented, NAD Pulm: Breathing unlabored PSY: normal mood, congruent affect  Neck  No gross deformity, swelling, bruising. No midline/bony TTP. Decreased ROM and lateral rotation and side bending. Spasm to right paraspinal muscles BUE strength 5/5.   Sensation intact to light touch.   2+ equal reflexes in triceps, biceps, brachioradialis tendons. Negative spurlings. NV intact distal BUEs.  I reviewed the XR and patient has quite severe cervical lordosis with mild osteophytic changes in lower cervical spine  Cervical indirect muscle energy technique was performed today that offered  relief   Assessment and Plan:  1.  Neck pain  Unfortunately, I believe most of this discomfort is coming from the lack of curvature in her C-spine.  OMT was helpful today and something we can continue regularly.  I recommended massage therapy and/or acupuncture.  We will trial a low-dose muscle relaxer to use as needed.  She will follow-up with me in 4-6 weeks at which time we can reassess and consider facet joint injections.  Lanier Clam, DO, ATC Sports Medicine Fellow  Addendum:  Patient seen in the office by fellow.  His history, exam, plan of care were precepted with me.  Karlton Lemon MD Kirt Boys

## 2019-04-26 NOTE — Patient Instructions (Signed)
We called in a muscle relaxer to your pharmacy that you can take up to twice a day as needed Recommend a trial of massage and/or acupuncture I believe most of your discomfort is caused by the lack of curvature to your C-Spine and will take time to retrain your body Continue PT home exercises Heating pads may be helpful Follow up with me in 4-6 weeks and we can reassess at that time

## 2019-06-07 ENCOUNTER — Other Ambulatory Visit: Payer: Self-pay

## 2019-06-07 ENCOUNTER — Ambulatory Visit: Payer: BC Managed Care – PPO | Admitting: Sports Medicine

## 2019-06-07 ENCOUNTER — Encounter: Payer: Self-pay | Admitting: Sports Medicine

## 2019-06-07 VITALS — BP 102/64 | Ht 67.0 in | Wt 140.0 lb

## 2019-06-07 DIAGNOSIS — M542 Cervicalgia: Secondary | ICD-10-CM | POA: Diagnosis not present

## 2019-06-07 NOTE — Progress Notes (Addendum)
   [facility] [address] [place], [place] [address] Phone: [phone] Fax: [phone]   Patient Name: Annette Gentry Date of Birth: 1956/03/24 Medical Record Number: [id number] Gender: female Date of Encounter: 06/07/2019  SUBJECTIVE:      Chief Complaint:  Neck pain follow-up   HPI:  Xylie is following up for chronic right-sided neck pain and spasm.  She has noticed some improvement since her last visit, but working in her yard this weekend aggravated the symptoms.  That led to further discussion of her recognizing when she is exercising or overuses her neck the symptoms are worse.  She denies any radiating symptoms into her fingertips.  She has some neck exercises at home, but admits to not performing the strengthening exercises as often as she should be.     ROS:     See HPI.   PERTINENT  PMH / PSH / FH / SH:  Past Medical, Surgical, Social, and Family History Reviewed & Updated in the EMR   OBJECTIVE:  BP 102/64   Ht 5\' 7"  (1.702 m)   Wt 140 lb (63.5 kg)   LMP 12/29/2013   BMI 21.93 kg/m  Physical Exam:  Vital signs are reviewed.   GEN: Alert and oriented, NAD Pulm: Breathing unlabored PSY: normal mood, congruent affect  MSK: No gross deformity, swelling, bruising. No midline/bony TTP. Decreased ROM in lateral rotation and right side bending BUE strength 5/5.   Negative spurlings. NV intact distal BUEs.   ASSESSMENT & PLAN:   1. Cervical lordosis  I explained to patient that the paraspinal tightness and spasm is most likely a direct cause from the lordosis of her cervical spine.  We provided her with both neck and shoulder exercises today.  It is imperative to stress scapular retraction while performing these exercises.  Recommended massage therapy as community feels safer.  I will leave it open-ended to her for what she would like to do regarding follow-up.  I do not think advanced imaging will be of value at this  time.   Lanier Clam, DO, ATC Sports Medicine Fellow  Addendum:  Patient seen in the office by fellow.  His history, exam, plan of care were precepted with me.  Karlton Lemon MD Kirt Boys

## 2020-01-11 ENCOUNTER — Other Ambulatory Visit: Payer: Self-pay | Admitting: Family Medicine

## 2020-01-11 DIAGNOSIS — M8589 Other specified disorders of bone density and structure, multiple sites: Secondary | ICD-10-CM

## 2020-02-13 ENCOUNTER — Other Ambulatory Visit: Payer: Self-pay | Admitting: Family Medicine

## 2020-02-13 DIAGNOSIS — Z0000 Encounter for general adult medical examination without abnormal findings: Secondary | ICD-10-CM

## 2020-03-25 NOTE — Progress Notes (Signed)
64 y.o. G67P1001 Married White or Caucasian Not Hispanic or Latino female here for annual exam.  Patient states that she feels that her prolapse is worse than it was last year. She notices a bulge when she is walking at the end of the day. Sitting isn't uncomfortable. She feels the bulge is her cervix not her rectocele.  She notices the prolapse every day.  She had a couple of week incident of fecal leakage. She changed her diet, which has helped. She is having a BM daily, soft.  She hasn't had any leakage of stool for a couple of months. She doesn't need to reduce her prolapse to defecate.  No voiding c/o.  No vaginal bleeding. No dyspareunia.     Patient's last menstrual period was 12/29/2013.          Sexually active: Yes.    The current method of family planning is post menopausal status.    Exercising: Yes.    walking  Smoker:  no  Health Maintenance: Pap:  03/10/2018 WNL NEG HPV, 01-24-15 WNL NEG HR HPV History of abnormal Pap:  Yes yes in her teens MMG: 04/04/19 Density C Bi-rads 1 neg  BMD:  03/26/20 osteopenia, T score -2.1. FRAX 8.4/0.9% Colonoscopy: 09-24-11 repeat in 10 yrs TDaP:  PCP follows per patient  Gardasil: NA   reports that she quit smoking about 32 years ago. She has never used smokeless tobacco. She reports current alcohol use of about 14.0 standard drinks of alcohol per week. She reports that she does not use drugs. 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 Husband is in the Physics department. She has a 8year old daughter[place]  Past Medical History:  Diagnosis Date  . Arthritis   . Asthma   . Blood transfusion    AS INFANT  . 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 Diverticulitis of colon with perforation 04/20/2011  . Heart murmur   . Medical history non-contributory   . Multinodular goiter   . Osteopenia   . Palpitations    CARDIAC STUDIES DONE  2009 - WAS TOLD THEY WERE NORMAL  . Personal history of colonic polyps 04/23/2011  . Rash, skin    AROUND STOMA  . Sjogren's syndrome [facility])     Past Surgical History:  Procedure Laterality Date  . COLON RESECTION  04/22/2011   Procedure: COLON RESECTION;  Surgeon: Adin Hector, MD;  Location: [facility];  Service: General;  Laterality: N/A;  colon resection, drainage of abscess  . COLON SURGERY  04/2011,07/2011   colostomy take down  . COLOSTOMY  04/22/2011   Procedure: COLOSTOMY;  Surgeon: Adin Hector, MD;  Location: [facility];  Service: General;  Laterality: N/A;  . DILATATION & CURETTAGE/HYSTEROSCOPY WITH TRUECLEAR N/A 12/19/2013   Procedure: DILATATION & CURETTAGE/HYSTEROSCOPY WITH TRUCLEAR;  Surgeon: Azalia Bilis, MD;  Location: [facility];  Service: Gynecology;  Laterality: N/A;  . FOREIGN REMOVED  AGE 80   PEANUT REMOVED FROM LUNG  . HERNIA REPAIR    . KNEE MASS EXCISION  1970'S   OSTEOCHONDROMA REMOVED  . myoma on right thigh  2002 (summer)  . PROCTOSCOPY  08/14/2011   Procedure: PROCTOSCOPY;  Surgeon: Adin Hector, MD;  Location: [facility];  Service: General;  Laterality: N/A;  rigid    Current Outpatient Medications  Medication Sig Dispense Refill  . Cholecalciferol (VITAMIN D) 2000 UNITS tablet Take 1,000 Units  by mouth daily.     . fluticasone (FLONASE) 50 MCG/ACT nasal spray Place 2 sprays into both nostrils daily.    . methylcellulose (ARTIFICIAL TEARS) 1 % ophthalmic solution Place 1 drop into both eyes daily.    . Multiple Vitamins-Minerals (MULTIVITAMINS THER. W/MINERALS) TABS Take 1 tablet by mouth daily.      . psyllium (METAMUCIL) 58.6 % packet Take 1 packet by mouth daily.     No current facility-administered medications for this visit.    Family History  Problem Relation Age of Onset  . Hyperlipidemia Mother   . Hyperlipidemia Father   . Parkinson's disease Father   . Hyperlipidemia Sister   . Hyperlipidemia Brother   . Thyroid disease Neg Hx   .  Breast cancer Neg Hx     Review of Systems  Allergic/Immunologic: Positive for environmental allergies.  All other systems reviewed and are negative.   Exam:   BP 110/64   Pulse 73   Ht 5\' 7"  (1.702 m)   Wt 141 lb (64 kg)   LMP 12/29/2013   SpO2 99%   BMI 22.08 kg/m   Weight change: @WEIGHTCHANGE @ Height:   Height: 5\' 7"  (170.2 cm)  Ht Readings from Last 3 Encounters:  03/28/20 5\' 7"  (1.702 m)  06/07/19 5\' 7"  (1.702 m)  04/26/19 5\' 7"  (1.702 m)    General appearance: alert, cooperative and appears stated age Head: Normocephalic, without obvious abnormality, atraumatic Neck: no adenopathy, supple, symmetrical, trachea midline and thyroid normal to inspection and palpation Lungs: clear to auscultation bilaterally Cardiovascular: regular rate and rhythm Breasts: normal appearance, no masses or tenderness Abdomen: soft, non-tender; non distended,  no masses,  no organomegaly Extremities: extremities normal, atraumatic, no cyanosis or edema Skin: Skin color, texture, turgor normal. No rashes or lesions Lymph nodes: Cervical, supraclavicular, and axillary nodes normal. No abnormal inguinal nodes palpated Neurologic: Grossly normal   Pelvic: External genitalia:  no lesions              Urethra:  normal appearing urethra with no masses, tenderness or lesions              Bartholins and Skenes: normal                 Vagina: normal appearing vagina with normal color and discharge, no lesions. Small grade 2 rectocele, grade 1 uterine prolapse. The patient was examined supine and standing, with and without valsalva.               Cervix: no lesions               Bimanual Exam:  Uterus:  normal size, contour, position, consistency, mobility, non-tender and retroverted              Adnexa: no mass, fullness, tenderness               Rectovaginal: Confirms               Anus:  normal sphincter tone, no lesions  Lamont Snowball chaperoned for the exam.  A:  Well Woman with normal  exam  Genital prolapse, small grade 2 rectocele. She feels her uterus is prolapsing, not significant on exam today. We discussed how prolapse can worsen depending on activity.   Osteopenia  P:   No pap this year  Mammogram scheduled  Discussed calcium and vit D  Discussed weight bearing exercise  Discussed breast self awareness  Labs with primary  Return for pessary  fitting. Will come at the end of the day, be active during the day. I would like to see her when her prolapse is at it's worst.

## 2020-03-26 ENCOUNTER — Other Ambulatory Visit: Payer: Self-pay

## 2020-03-26 ENCOUNTER — Ambulatory Visit
Admission: RE | Admit: 2020-03-26 | Discharge: 2020-03-26 | Disposition: A | Discharge status: Home / Self Care | Payer: BC Managed Care – PPO | Source: Ambulatory Visit | Attending: Family Medicine | Admitting: Family Medicine

## 2020-03-26 DIAGNOSIS — M8589 Other specified disorders of bone density and structure, multiple sites: Secondary | ICD-10-CM

## 2020-03-28 ENCOUNTER — Ambulatory Visit: Payer: BC Managed Care – PPO | Admitting: Obstetrics and Gynecology

## 2020-03-28 ENCOUNTER — Other Ambulatory Visit: Payer: Self-pay

## 2020-03-28 ENCOUNTER — Encounter: Payer: Self-pay | Admitting: Obstetrics and Gynecology

## 2020-03-28 VITALS — BP 110/64 | HR 73 | Ht 67.0 in | Wt 141.0 lb

## 2020-03-28 DIAGNOSIS — Z8739 Personal history of other diseases of the musculoskeletal system and connective tissue: Secondary | ICD-10-CM

## 2020-03-28 DIAGNOSIS — N816 Rectocele: Secondary | ICD-10-CM | POA: Diagnosis not present

## 2020-03-28 DIAGNOSIS — Z01419 Encounter for gynecological examination (general) (routine) without abnormal findings: Secondary | ICD-10-CM | POA: Diagnosis not present

## 2020-03-28 NOTE — Patient Instructions (Addendum)
Counselor: Rogene Houston [phone]  Kegel Exercises  Kegel exercises can help strengthen your pelvic floor muscles. The pelvic floor is a group of muscles that support your rectum, small intestine, and bladder. In females, pelvic floor muscles also help support the womb (uterus). These muscles help you control the flow of urine and stool. Kegel exercises are painless and simple, and they do not require any equipment. Your provider may suggest Kegel exercises to:  Improve bladder and bowel control.  Improve sexual response.  Improve weak pelvic floor muscles after surgery to remove the uterus (hysterectomy) or pregnancy (females).  Improve weak pelvic floor muscles after prostate gland removal or surgery (males). Kegel exercises involve squeezing your pelvic floor muscles, which are the same muscles you squeeze when you try to stop the flow of urine or keep from passing gas. The exercises can be done while sitting, standing, or lying down, but it is best to vary your position. Exercises How to do Kegel exercises: 1. Squeeze your pelvic floor muscles tight. You should feel a tight lift in your rectal area. If you are a female, you should also feel a tightness in your vaginal area. Keep your stomach, buttocks, and legs relaxed. 2. Hold the muscles tight for up to 10 seconds. 3. Breathe normally. 4. Relax your muscles. 5. Repeat as told by your health care provider. Repeat this exercise daily as told by your health care provider. Continue to do this exercise for at least 4-6 weeks, or for as long as told by your health care provider. You may be referred to a physical therapist who can help you learn more about how to do Kegel exercises. Depending on your condition, your health care provider may recommend:  Varying how long you squeeze your muscles.  Doing several sets of exercises every day.  Doing exercises for several weeks.  Making Kegel exercises a part of your regular exercise  routine. This information is not intended to replace advice given to you by your health care provider. Make sure you discuss any questions you have with your health care provider. Document Revised: 01/05/2018 Document Reviewed: 01/05/2018 Elsevier Patient Education  [place].  About Rectocele  Overview  A rectocele is a type of hernia which causes different degrees of bulging of the rectal tissues into the vaginal wall.  You may even notice that it presses against the vaginal wall so much that some vaginal tissues droop outside of the opening of your vagina.  Causes of Rectocele  The most common cause is childbirth.  The muscles and ligaments in the pelvis that hold up and support the female organs and vagina become stretched and weakened during labor and delivery.  The more babies you have, the more the support tissues are stretched and weakened.  Not everyone who has a baby will develop a rectocele.  Some women have stronger supporting tissue in the pelvis and may not have as much of a problem as others.  Women who have a Cesarean section usually do not get rectocele's unless they pushed a long time prior to the cesarean delivery.  Other conditions that can cause a rectocele include chronic constipation, a chronic cough, a lot of heavy lifting, and obesity.  Older women may have this problem because the loss of female hormones causes the vaginal tissue to become weaker.  Symptoms  There may not be any symptoms.  If you do have symptoms, they may include:  Pelvic pressure in the rectal area  Protrusion of  the lower part of the vagina through the opening of the vagina  Constipation and trapping of the stool, making it difficult to have a bowel movement.  In severe cases, you may have to press on the lower part of your vagina to help push the stool out of you rectum.  This is called splinting to empty.  Diagnosing Rectocele  Your health care provider will ask about your symptoms  and perform a pelvic exam.  S/he will ask you to bear down, pushing like you are having a bowel movement so as to see how far the lower part of the vagina protrudes into the vagina and possible outside of the vagina.  Your provider will also ask you to contract the muscles of your pelvis (like you are stopping the stream in the middle of urinating) to determine the strength of your pelvic muscles.  Your provider may also do a rectal exam.  Treatment Options  If you do not have any symptoms, no treatment may be necessary.  Other treatment options include:  Pelvic floor exercises: Contracting the muscles in your genital area may help strengthen your muscles and support the organs.  Be sure to get proper exercise instruction from you physical therapist.  A pessary (removealbe pelvic support device) sometimes helps rectocele symptoms.  Surgery: Surgical repair may be necessary. In some cases the uterus may need to be taken out ( a hysterectomy) as well.  There are many types of surgery for pelvic support problems.  Look for physicians who specialize in repair procedures.  You can take care of yourself by:  Treating and preventing constipation  Avoiding heavy lifting, and lifting correctly (with your legs, not with you waist or back)  Treating a chronic cough or bronchitis  Not smoking  avoiding too much weight gain  Doing pelvic floor exercises   2007, [employer] Doc.[email or web address]   Osteopenia  Osteopenia is a loss of thickness (density) inside of the bones. Another name for osteopenia is low bone mass. Mild osteopenia is a normal part of aging. It is not a disease, and it does not cause symptoms. However, if you have osteopenia and continue to lose bone mass, you could develop a condition that causes the bones to become thin and break more easily (osteoporosis). You may also lose some height, have back pain, and have a stooped posture. Although osteopenia is not a disease, making  changes to your lifestyle and diet can help to prevent osteopenia from developing into osteoporosis. What are the causes? Osteopenia is caused by loss of calcium in the bones.  Bones are constantly changing. Old bone cells are continually being replaced with new bone cells. This process builds new bone. The mineral calcium is needed to build new bone and maintain bone density. Bone density is usually highest around age 2. After that, most people's bodies cannot replace all the bone they have lost with new bone. What increases the risk? You are more likely to develop this condition if:  You are older than age 14.  You are a woman who went through menopause early.  You have a long illness that keeps you in bed.  You do not get enough exercise.  You lack certain nutrients (malnutrition).  You have an overactive thyroid gland (hyperthyroidism).  You smoke.  You drink a lot of alcohol.  You are taking medicines that weaken the bones, such as steroids. What are the signs or symptoms? This condition does not cause any symptoms. You may have  a slightly higher risk for bone breaks (fractures), so getting fractures more easily than normal may be an indication of osteopenia. How is this diagnosed? Your health care provider can diagnose this condition with a special type of X-ray exam that measures bone density (dual-energy X-ray absorptiometry, DEXA). This test can measure bone density in your hips, spine, and wrists. Osteopenia has no symptoms, so this condition is usually diagnosed after a routine bone density screening test is done for osteoporosis. This routine screening is usually done for:  Women who are age 10 or older.  Men who are age 37 or older. If you have risk factors for osteopenia, you may have the screening test at an earlier age. How is this treated? Making dietary and lifestyle changes can lower your risk for osteoporosis. If you have severe osteopenia that is close to  becoming osteoporosis, your health care provider may prescribe medicines and dietary supplements such as calcium and vitamin D. These supplements help to rebuild bone density. Follow these instructions at home:   Take over-the-counter and prescription medicines only as told by your health care provider. These include vitamins and supplements.  Eat a diet that is high in calcium and vitamin D. ? Calcium is found in dairy products, beans, salmon, and leafy green vegetables like spinach and broccoli. ? Look for foods that have vitamin D and calcium added to them (fortified foods), such as orange juice, cereal, and bread.  Do 30 or more minutes of a weight-bearing exercise every day, such as walking, jogging, or playing a sport. These types of exercises strengthen the bones.  Take precautions at home to lower your risk of falling, such as: ? Keeping rooms well-lit and free of clutter, such as cords. ? Installing safety rails on stairs. ? Using rubber mats in the bathroom or other areas that are often wet or slippery.  Do not use any products that contain nicotine or tobacco, such as cigarettes and e-cigarettes. If you need help quitting, ask your health care provider.  Avoid alcohol or limit alcohol intake to no more than 1 drink a day for nonpregnant women and 2 drinks a day for men. One drink equals 12 oz of beer, 5 oz of wine, or 1 oz of hard liquor.  Keep all follow-up visits as told by your health care provider. This is important. Contact a health care provider if:  You have not had a bone density screening for osteoporosis and you are: ? A woman, age 48 or older. ? A man, age 107 or older.  You are a postmenopausal woman who has not had a bone density screening for osteoporosis.  You are older than age 68 and you want to know if you should have bone density screening for osteoporosis. Summary  Osteopenia is a loss of thickness (density) inside of the bones. Another name for  osteopenia is low bone mass.  Osteopenia is not a disease, but it may increase your risk for a condition that causes the bones to become thin and break more easily (osteoporosis).  You may be at risk for osteopenia if you are older than age 82 or if you are a woman who went through early menopause.  Osteopenia does not cause any symptoms, but it can be diagnosed with a bone density screening test.  Dietary and lifestyle changes are the first treatment for osteopenia. These may lower your risk for osteoporosis. This information is not intended to replace advice given to you by your health care  provider. Make sure you discuss any questions you have with your health care provider. Document Revised: 04/30/2017 Document Reviewed: 02/24/2017 Elsevier Patient Education  2020 [employer].

## 2020-04-02 ENCOUNTER — Ambulatory Visit: Payer: BC Managed Care – PPO

## 2020-05-13 ENCOUNTER — Ambulatory Visit
Admission: RE | Admit: 2020-05-13 | Discharge: 2020-05-13 | Disposition: A | Discharge status: Home / Self Care | Payer: BC Managed Care – PPO | Source: Ambulatory Visit | Attending: Family Medicine | Admitting: Family Medicine

## 2020-05-13 ENCOUNTER — Other Ambulatory Visit: Payer: Self-pay

## 2020-05-13 DIAGNOSIS — Z0000 Encounter for general adult medical examination without abnormal findings: Secondary | ICD-10-CM

## 2020-07-11 ENCOUNTER — Other Ambulatory Visit: Payer: Self-pay

## 2020-07-11 ENCOUNTER — Ambulatory Visit: Payer: BC Managed Care – PPO | Admitting: Obstetrics and Gynecology

## 2020-07-11 ENCOUNTER — Encounter: Payer: Self-pay | Admitting: Obstetrics and Gynecology

## 2020-07-11 VITALS — BP 110/62 | HR 77 | Ht 67.0 in | Wt 145.0 lb

## 2020-07-11 DIAGNOSIS — Z4689 Encounter for fitting and adjustment of other specified devices: Secondary | ICD-10-CM | POA: Diagnosis not present

## 2020-07-11 DIAGNOSIS — N816 Rectocele: Secondary | ICD-10-CM | POA: Diagnosis not present

## 2020-07-11 DIAGNOSIS — N814 Uterovaginal prolapse, unspecified: Secondary | ICD-10-CM

## 2020-07-11 NOTE — Progress Notes (Signed)
GYNECOLOGY  VISIT   HPI: 65 y.o.   Married White or Caucasian Not Hispanic or Latino  female   G1P1001 with Patient's last menstrual period was 12/29/2013.   here for a pessary fitting. When she was seen in 10/21 she was c/o worsening symptomatic prolapse. She was noted to have a small grade 2 rectocele and a grade 1 uterine prolapse. She felt that her cervix had been prolapsing to her introitus. She was instructed to come back at the end of a busy day for a repeat exam and pessary fitting.      GYNECOLOGIC HISTORY: Patient's last menstrual period was 12/29/2013. Contraception:PMP Menopausal hormone therapy: none        OB History    Gravida  1   Para  1   Term  1   Preterm  0   AB  0   Living  1     SAB      IAB      Ectopic      Multiple      Live Births  1              Patient Active Problem List   Diagnosis Date Noted  . Snoring 06/30/2016  . Multinodular goiter 02/19/2015  . IUD (intrauterine device) in place 01/29/2014  . Fibroid, uterine 11/24/2013  . Menorrhagia, premenopausal 11/24/2013  . Personal history of colonic polyps 04/23/2011  . Diverticulitis of colon with perforation 04/20/2011  . Sjoegren syndrome 04/17/2011  . Asthma 04/17/2011    Past Medical History:  Diagnosis Date  . Arthritis   . Asthma   . Blood transfusion    AS INFANT  . 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 Diverticulitis of colon with perforation 04/20/2011  . Heart murmur   . Medical history non-contributory   . Multinodular goiter   . Osteopenia   . Palpitations    CARDIAC STUDIES DONE 2009 - WAS TOLD THEY WERE NORMAL  . Personal history of colonic polyps 04/23/2011  . Rash, skin    AROUND STOMA  . Sjogren's syndrome [facility])     Past Surgical History:  Procedure Laterality Date  . COLON RESECTION  04/22/2011   Procedure: COLON RESECTION;  Surgeon: Adin Hector, MD;  Location: [facility];  Service: General;  Laterality: N/A;  colon resection, drainage of  abscess  . COLON SURGERY  04/2011,07/2011   colostomy take down  . COLOSTOMY  04/22/2011   Procedure: COLOSTOMY;  Surgeon: Adin Hector, MD;  Location[facility];  Service: General;  Laterality: N/A;  . DILATATION & CURETTAGE/HYSTEROSCOPY WITH TRUECLEAR N/A 12/19/2013   Procedure: DILATATION & CURETTAGE/HYSTEROSCOPY WITH TRUCLEAR;  Surgeon: Azalia Bilis, MD;  Location: [facility];  Service: Gynecology;  Laterality: N/A;  . FOREIGN REMOVED  AGE 3   PEANUT REMOVED FROM LUNG  . HERNIA REPAIR    . KNEE MASS EXCISION  1970'S   OSTEOCHONDROMA REMOVED  . myoma on right thigh  2002 (summer)  . PROCTOSCOPY  08/14/2011   Procedure: PROCTOSCOPY;  Surgeon: Adin Hector, MD;  Location[facility];  Service: General;  Laterality: N/A;  rigid    Current Outpatient Medications  Medication Sig Dispense Refill  . Cholecalciferol (VITAMIN D) 2000 UNITS tablet Take 1,000 Units by mouth daily.     . fluticasone (FLONASE) 50 MCG/ACT nasal spray Place 2 sprays into both nostrils daily.    . methylcellulose (ARTIFICIAL TEARS) 1 % ophthalmic solution Place 1 drop into both eyes daily.    [name]  Multiple Vitamins-Minerals (MULTIVITAMINS THER. W/MINERALS) TABS Take 1 tablet by mouth daily.      . psyllium (METAMUCIL) 58.6 % packet Take 1 packet by mouth daily.     No current facility-administered medications for this visit.     ALLERGIES: Clindamycin and Codeine  Family History  Problem Relation Age of Onset  . Hyperlipidemia Mother   . Hyperlipidemia Father   . Parkinson's disease Father   . Hyperlipidemia Sister   . Hyperlipidemia Brother   . Thyroid disease Neg Hx   . Breast cancer Neg Hx     Social History   Socioeconomic History  . Marital status: Married    Spouse name: Not on file  . Number of children: Not on file  . Years of education: Not on file  . Highest education level: Not on file  Occupational History  . Not on file  Tobacco Use  . Smoking status: Former Smoker    Quit date:  08/04/1987    Years since quitting: 32.9  . Smokeless tobacco: Never Used  Vaping Use  . Vaping Use: Never used  Substance and Sexual Activity  . Alcohol use: Yes    Alcohol/week: 14.0 standard drinks    Types: 14 Glasses of wine per week  . Drug use: No  . Sexual activity: Yes    Partners: Male    Birth control/protection: Post-menopausal  Other Topics Concern  . Not on file  Social History Narrative  . Not on file   Social Determinants of Health   Financial Resource Strain: Not on file  Food Insecurity: Not on file  Transportation Needs: Not on file  Physical Activity: Not on file  Stress: Not on file  Social Connections: Not on file  Intimate Partner Violence: Not on file    Review of Systems  All other systems reviewed and are negative.   PHYSICAL EXAMINATION:    LMP 12/29/2013     General appearance: alert, cooperative and appears stated age   Pelvic: External genitalia:  no lesions              Urethra:  normal appearing urethra with no masses, tenderness or lesions              Bartholins and Skenes: normal                 Vagina: small grade 2 rectocele, grade 2 uterine prolapse. The cervix comes 1 cm outside of the introitus with valsalva with standing.               Cervix: no lesions              Chaperone was present for exam.   1. Uterine prolapse Fitted with #4 ring pessary with support, comfortable in the office  2. Rectocele   3. Encounter for fitting and adjustment of pessary

## 2020-07-12 ENCOUNTER — Ambulatory Visit: Payer: BC Managed Care – PPO | Admitting: Obstetrics and Gynecology

## 2020-07-18 ENCOUNTER — Other Ambulatory Visit: Payer: Self-pay

## 2020-07-18 ENCOUNTER — Ambulatory Visit: Payer: BC Managed Care – PPO | Admitting: Obstetrics and Gynecology

## 2020-07-18 ENCOUNTER — Encounter: Payer: Self-pay | Admitting: Obstetrics and Gynecology

## 2020-07-18 VITALS — BP 120/64 | HR 68 | Ht 67.0 in | Wt 145.0 lb

## 2020-07-18 DIAGNOSIS — J4520 Mild intermittent asthma, uncomplicated: Secondary | ICD-10-CM | POA: Insufficient documentation

## 2020-07-18 DIAGNOSIS — R159 Full incontinence of feces: Secondary | ICD-10-CM | POA: Insufficient documentation

## 2020-07-18 DIAGNOSIS — E041 Nontoxic single thyroid nodule: Secondary | ICD-10-CM | POA: Insufficient documentation

## 2020-07-18 DIAGNOSIS — N814 Uterovaginal prolapse, unspecified: Secondary | ICD-10-CM

## 2020-07-18 DIAGNOSIS — H35039 Hypertensive retinopathy, unspecified eye: Secondary | ICD-10-CM | POA: Insufficient documentation

## 2020-07-18 DIAGNOSIS — H9319 Tinnitus, unspecified ear: Secondary | ICD-10-CM | POA: Insufficient documentation

## 2020-07-18 DIAGNOSIS — J309 Allergic rhinitis, unspecified: Secondary | ICD-10-CM | POA: Insufficient documentation

## 2020-07-18 DIAGNOSIS — N816 Rectocele: Secondary | ICD-10-CM | POA: Insufficient documentation

## 2020-07-18 DIAGNOSIS — J320 Chronic maxillary sinusitis: Secondary | ICD-10-CM | POA: Insufficient documentation

## 2020-07-18 DIAGNOSIS — E7800 Pure hypercholesterolemia, unspecified: Secondary | ICD-10-CM | POA: Insufficient documentation

## 2020-07-18 DIAGNOSIS — Z4689 Encounter for fitting and adjustment of other specified devices: Secondary | ICD-10-CM

## 2020-07-18 NOTE — Progress Notes (Signed)
GYNECOLOGY  VISIT   HPI: 65 y.o.   Married White or Caucasian Not Hispanic or Latino  female   G1P1001 with Patient's last menstrual period was 12/29/2013.   here for pessary follow up. She states that she feels like it is work well. She has taken it out and put it back in at home.   The pessary was placed last week. Feels comfortable, doesn't feel it is slipping. No bowel or bladder changes. No urinary incontinence.    GYNECOLOGIC HISTORY: Patient's last menstrual period was 12/29/2013. Contraception:PMP  Menopausal hormone therapy:  None         OB History    Gravida  1   Para  1   Term  1   Preterm  0   AB  0   Living  1     SAB      IAB      Ectopic      Multiple      Live Births  1              Patient Active Problem List   Diagnosis Date Noted  . Snoring 06/30/2016  . Multinodular goiter 02/19/2015  . IUD (intrauterine device) in place 01/29/2014  . Fibroid, uterine 11/24/2013  . Menorrhagia, premenopausal 11/24/2013  . Personal history of colonic polyps 04/23/2011  . Diverticulitis of colon with perforation 04/20/2011  . Sjoegren syndrome 04/17/2011  . Asthma 04/17/2011    Past Medical History:  Diagnosis Date  . Arthritis   . Asthma   . Blood transfusion    AS INFANT  . 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 Diverticulitis of colon with perforation 04/20/2011  . Heart murmur   . Medical history non-contributory   . Multinodular goiter   . Osteopenia   . Palpitations    CARDIAC STUDIES DONE 2009 - WAS TOLD THEY WERE NORMAL  . Personal history of colonic polyps 04/23/2011  . Rash, skin    AROUND STOMA  . Sjogren's syndrome [facility])     Past Surgical History:  Procedure Laterality Date  . COLON RESECTION  04/22/2011   Procedure: COLON RESECTION;  Surgeon: Adin Hector, MD;  [facility]: [facility];  Service: General;  Laterality: N/A;  colon resection, drainage of abscess  . COLON SURGERY  04/2011,07/2011   colostomy take down  . COLOSTOMY   04/22/2011   Procedure: COLOSTOMY;  Surgeon: Adin Hector, MD;  [facility];  Service: General;  Laterality: N/A;  . DILATATION & CURETTAGE/HYSTEROSCOPY WITH TRUECLEAR N/A 12/19/2013   Procedure: DILATATION & CURETTAGE/HYSTEROSCOPY WITH TRUCLEAR;  Surgeon: Azalia Bilis, MD;  Location: [facility];  Service: Gynecology;  Laterality: N/A;  . FOREIGN REMOVED  AGE 59   PEANUT REMOVED FROM LUNG  . HERNIA REPAIR    . KNEE MASS EXCISION  1970'S   OSTEOCHONDROMA REMOVED  . myoma on right thigh  2002 (summer)  . PROCTOSCOPY  08/14/2011   Procedure: PROCTOSCOPY;  Surgeon: Adin Hector, MD;  [facility];  Service: General;  Laterality: N/A;  rigid    Current Outpatient Medications  Medication Sig Dispense Refill  . albuterol (VENTOLIN HFA) 108 (90 Base) MCG/ACT inhaler 2 puffs    . Cholecalciferol (VITAMIN D) 2000 UNITS tablet Take 1,000 Units by mouth daily.    . fluticasone (FLONASE) 50 MCG/ACT nasal spray Place 2 sprays into both nostrils daily.    . methylcellulose (ARTIFICIAL TEARS) 1 % ophthalmic solution Place 1 drop into both eyes daily.    [name]  Multiple Vitamins-Minerals (MULTIVITAMINS THER. W/MINERALS) TABS Take 1 tablet by mouth daily.    . psyllium (METAMUCIL) 58.6 % packet Take 1 packet by mouth daily.     No current facility-administered medications for this visit.     ALLERGIES: Clindamycin and Codeine  Family History  Problem Relation Age of Onset  . Hyperlipidemia Mother   . Hyperlipidemia Father   . Parkinson's disease Father   . Hyperlipidemia Sister   . Hyperlipidemia Brother   . Thyroid disease Neg Hx   . Breast cancer Neg Hx     Social History   Socioeconomic History  . Marital status: Married    Spouse name: Not on file  . Number of children: Not on file  . Years of education: Not on file  . Highest education level: Not on file  Occupational History  . Not on file  Tobacco Use  . Smoking status: Former Smoker    Quit date: 08/04/1987    Years  since quitting: 32.9  . Smokeless tobacco: Never Used  Vaping Use  . Vaping Use: Never used  Substance and Sexual Activity  . Alcohol use: Yes    Alcohol/week: 14.0 standard drinks    Types: 14 Glasses of wine per week  . Drug use: No  . Sexual activity: Yes    Partners: Male    Birth control/protection: Post-menopausal  Other Topics Concern  . Not on file  Social History Narrative  . Not on file   Social Determinants of Health   Financial Resource Strain: Not on file  Food Insecurity: Not on file  Transportation Needs: Not on file  Physical Activity: Not on file  Stress: Not on file  Social Connections: Not on file  Intimate Partner Violence: Not on file    Review of Systems  All other systems reviewed and are negative.   PHYSICAL EXAMINATION:    LMP 12/29/2013     General appearance: alert, cooperative and appears stated age  Pelvic: External genitalia:  no lesions              Urethra:  normal appearing urethra with no masses, tenderness or lesions              Bartholins and Skenes: normal                 Vagina: the pessary was removed and cleaned. No vaginal irritation. The pessary was replaced.               Cervix: no lesions               1. Pessary maintenance Doing great with the #4 ring with support. She will take out overnight 1-2 days a week F/U in one month

## 2020-08-15 ENCOUNTER — Other Ambulatory Visit: Payer: Self-pay

## 2020-08-15 ENCOUNTER — Encounter: Payer: Self-pay | Admitting: Obstetrics and Gynecology

## 2020-08-15 ENCOUNTER — Ambulatory Visit: Payer: BC Managed Care – PPO | Admitting: Obstetrics and Gynecology

## 2020-08-15 VITALS — BP 122/66 | HR 66 | Resp 14 | Ht 67.0 in | Wt 143.6 lb

## 2020-08-15 DIAGNOSIS — Z4689 Encounter for fitting and adjustment of other specified devices: Secondary | ICD-10-CM

## 2020-08-15 DIAGNOSIS — N814 Uterovaginal prolapse, unspecified: Secondary | ICD-10-CM | POA: Diagnosis not present

## 2020-08-15 NOTE — Progress Notes (Signed)
GYNECOLOGY  VISIT   HPI: 65 y.o.   Married White or Caucasian Not Hispanic or Latino  female   G1P1001 with Patient's last menstrual period was 12/29/2013.   here for pessary check. Patient states she is doing well with her pessary. She says that she doesn't notice that its there.  She has a #4 ring pessary for a grade 2 uterine prolapse and a small grade 2 rectocele. She is taking the pessary out overnight one night a week. No bleeding, no abnormal d/c. Normal bowel and bladder function.   GYNECOLOGIC HISTORY: Patient's last menstrual period was 12/29/2013. Contraception:PMP Menopausal hormone therapy: none         OB History    Gravida  1   Para  1   Term  1   Preterm  0   AB  0   Living  1     SAB      IAB      Ectopic      Multiple      Live Births  1              Patient Active Problem List   Diagnosis Date Noted  . Allergic rhinitis 07/18/2020  . Chronic maxillary sinusitis 07/18/2020  . Hypertensive retinopathy 07/18/2020  . Incontinence of feces 07/18/2020  . Mild intermittent asthma 07/18/2020  . Pure hypercholesterolemia 07/18/2020  . Rectocele 07/18/2020  . Thyroid nodule 07/18/2020  . Tinnitus 07/18/2020  . Uterine prolapse 07/18/2020  . Snoring 06/30/2016  . Multinodular goiter 02/19/2015  . IUD (intrauterine device) in place 01/29/2014  . Fibroid, uterine 11/24/2013  . Menorrhagia, premenopausal 11/24/2013  . Personal history of colonic polyps 04/23/2011  . Diverticulitis of colon with perforation 04/20/2011  . Sjoegren syndrome 04/17/2011  . Asthma 04/17/2011    Past Medical History:  Diagnosis Date  . Arthritis   . Asthma   . Blood transfusion    AS INFANT  . 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 Diverticulitis of colon with perforation 04/20/2011  . Heart murmur   . Medical history non-contributory   . Multinodular goiter   . Osteopenia   . Palpitations    CARDIAC STUDIES DONE 2009 - WAS TOLD THEY WERE NORMAL  . Personal  history of colonic polyps 04/23/2011  . Rash, skin    AROUND STOMA  . Sjogren's syndrome [facility])     Past Surgical History:  Procedure Laterality Date  . COLON RESECTION  04/22/2011   Procedure: COLON RESECTION;  Surgeon: Adin Hector, MD;  [facility];  Service: General;  Laterality: N/A;  colon resection, drainage of abscess  . COLON SURGERY  04/2011,07/2011   colostomy take down  . COLOSTOMY  04/22/2011   Procedure: COLOSTOMY;  Surgeon: Adin Hector, MD;  [facility];  Service: General;  Laterality: N/A;  . DILATATION & CURETTAGE/HYSTEROSCOPY WITH TRUECLEAR N/A 12/19/2013   Procedure: DILATATION & CURETTAGE/HYSTEROSCOPY WITH TRUCLEAR;  Surgeon: Azalia Bilis, MD;  Location: [facility];  Service: Gynecology;  Laterality: N/A;  . FOREIGN REMOVED  AGE 48   PEANUT REMOVED FROM LUNG  . HERNIA REPAIR    . KNEE MASS EXCISION  1970'S   OSTEOCHONDROMA REMOVED  . myoma on right thigh  2002 (summer)  . PROCTOSCOPY  08/14/2011   Procedure: PROCTOSCOPY;  Surgeon: Adin Hector, MD;  [facility];  Service: General;  Laterality: N/A;  rigid    Current Outpatient Medications  Medication Sig Dispense Refill  . albuterol (  VENTOLIN HFA) 108 (90 Base) MCG/ACT inhaler 2 puffs    . Cholecalciferol (VITAMIN D) 2000 UNITS tablet Take 1,000 Units by mouth daily.    . fluticasone (FLONASE) 50 MCG/ACT nasal spray Place 2 sprays into both nostrils daily.    . methylcellulose (ARTIFICIAL TEARS) 1 % ophthalmic solution Place 1 drop into both eyes daily.    . Multiple Vitamins-Minerals (MULTIVITAMINS THER. W/MINERALS) TABS Take 1 tablet by mouth daily.    . psyllium (METAMUCIL) 58.6 % packet Take 1 packet by mouth daily.     No current facility-administered medications for this visit.     ALLERGIES: Clindamycin and Codeine  Family History  Problem Relation Age of Onset  . Hyperlipidemia Mother   . Hyperlipidemia Father   . Parkinson's disease Father   . Hyperlipidemia Sister   .  Hyperlipidemia Brother   . Thyroid disease Neg Hx   . Breast cancer Neg Hx     Social History   Socioeconomic History  . Marital status: Married    Spouse name: Not on file  . Number of children: Not on file  . Years of education: Not on file  . Highest education level: Not on file  Occupational History  . Not on file  Tobacco Use  . Smoking status: Former Smoker    Quit date: 08/04/1987    Years since quitting: 33.0  . Smokeless tobacco: Never Used  Vaping Use  . Vaping Use: Never used  Substance and Sexual Activity  . Alcohol use: Yes    Alcohol/week: 14.0 standard drinks    Types: 14 Glasses of wine per week  . Drug use: No  . Sexual activity: Yes    Partners: Male    Birth control/protection: Post-menopausal  Other Topics Concern  . Not on file  Social History Narrative  . Not on file   Social Determinants of Health   Financial Resource Strain: Not on file  Food Insecurity: Not on file  Transportation Needs: Not on file  Physical Activity: Not on file  Stress: Not on file  Social Connections: Not on file  Intimate Partner Violence: Not on file    Review of Systems  All other systems reviewed and are negative.   PHYSICAL EXAMINATION:    LMP 12/29/2013     General appearance: alert, cooperative and appears stated age  Pelvic: External genitalia:  no lesions              Urethra:  normal appearing urethra with no masses, tenderness or lesions              Bartholins and Skenes: normal                 Vagina: the pessary was removed and cleaned. No vaginal irritation, pessary replaced.               Cervix: no lesions               Chaperone was present for exam.  1. Pessary maintenance Doing well, f/u in 3 months  2. Uterine prolapse Well controlled with the pessary

## 2020-11-20 ENCOUNTER — Ambulatory Visit: Payer: BC Managed Care – PPO | Admitting: Obstetrics and Gynecology

## 2020-11-20 ENCOUNTER — Other Ambulatory Visit: Payer: Self-pay

## 2020-11-20 ENCOUNTER — Encounter: Payer: Self-pay | Admitting: Obstetrics and Gynecology

## 2020-11-20 VITALS — BP 104/60

## 2020-11-20 DIAGNOSIS — N814 Uterovaginal prolapse, unspecified: Secondary | ICD-10-CM | POA: Diagnosis not present

## 2020-11-20 DIAGNOSIS — Z4689 Encounter for fitting and adjustment of other specified devices: Secondary | ICD-10-CM

## 2020-11-20 NOTE — Progress Notes (Signed)
GYNECOLOGY  VISIT   HPI: 65 y.o.   Married White or Caucasian Not Hispanic or Latino  female   G1P1001 with Patient's last menstrual period was 12/29/2013.   here for  pessary check  She has a #4 ring pessary for a grade 2 uterine prolapse and a small grade 2 rectocele. She is taking the pessary out overnight one night a week. No bleeding, no abnormal d/c. Normal bowel and bladder function. No significant incontinence, not worse with the pessary. No vaginal bleeding, no discomfort.   GYNECOLOGIC HISTORY: Patient's last menstrual period was 12/29/2013. Contraception:post menopausal Menopausal hormone therapy: none        OB History     Gravida  1   Para  1   Term  1   Preterm  0   AB  0   Living  1      SAB      IAB      Ectopic      Multiple      Live Births  1              Patient Active Problem List   Diagnosis Date Noted   Allergic rhinitis 07/18/2020   Chronic maxillary sinusitis 07/18/2020   Hypertensive retinopathy 07/18/2020   Incontinence of feces 07/18/2020   Mild intermittent asthma 07/18/2020   Pure hypercholesterolemia 07/18/2020   Rectocele 07/18/2020   Thyroid nodule 07/18/2020   Tinnitus 07/18/2020   Uterine prolapse 07/18/2020   Snoring 06/30/2016   Multinodular goiter 02/19/2015   IUD (intrauterine device) in place 01/29/2014   Fibroid, uterine 11/24/2013   Menorrhagia, premenopausal 11/24/2013   Personal history of colonic polyps 04/23/2011   Diverticulitis of colon with perforation 04/20/2011   Sjoegren syndrome 04/17/2011   Asthma 04/17/2011    Past Medical History:  Diagnosis Date   Arthritis    Asthma    Blood transfusion    AS INFANT   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   Diverticulitis of colon with perforation 04/20/2011   Heart murmur    Medical history non-contributory    Multinodular goiter    Osteopenia    Palpitations    CARDIAC STUDIES DONE 2009 - WAS TOLD THEY WERE NORMAL   Personal history of colonic  polyps 04/23/2011   Rash, skin    AROUND STOMA   Sjogren's syndrome ([place])     Past Surgical History:  Procedure Laterality Date   COLON RESECTION  04/22/2011   Procedure: COLON RESECTION;  Surgeon: Adin Hector, MD;  [facility];  Service: General;  Laterality: N/A;  colon resection, drainage of abscess   COLON SURGERY  04/2011,07/2011   colostomy take down   COLOSTOMY  04/22/2011   Procedure: COLOSTOMY;  Surgeon: Adin Hector, MD;  [facility];  Service: General;  Laterality: N/A;   DILATATION & CURETTAGE/HYSTEROSCOPY WITH TRUECLEAR N/A 12/19/2013   Procedure: DILATATION & CURETTAGE/HYSTEROSCOPY WITH TRUCLEAR;  Surgeon: Azalia Bilis, MD;  Location: [place] [facility];  Service: Gynecology;  Laterality: N/A;   FOREIGN REMOVED  AGE 27   PEANUT REMOVED FROM LUNG   HERNIA REPAIR     KNEE MASS EXCISION  1970'S   OSTEOCHONDROMA REMOVED   myoma on right thigh  2002 (summer)   PROCTOSCOPY  08/14/2011   Procedure: PROCTOSCOPY;  Surgeon: Adin Hector, MD;  [facility];  Service: General;  Laterality: N/A;  rigid    Current Outpatient Medications  Medication Sig Dispense Refill   albuterol (VENTOLIN HFA)  108 (90 Base) MCG/ACT inhaler 2 puffs     Cholecalciferol (VITAMIN D) 2000 UNITS tablet Take 1,000 Units by mouth daily.     FLOVENT HFA 110 MCG/ACT inhaler Inhale 2 puffs into the lungs 2 (two) times daily.     fluticasone (FLONASE) 50 MCG/ACT nasal spray Place 2 sprays into both nostrils daily.     methylcellulose (ARTIFICIAL TEARS) 1 % ophthalmic solution Place 1 drop into both eyes daily.     Multiple Vitamins-Minerals (MULTIVITAMINS THER. W/MINERALS) TABS Take 1 tablet by mouth daily.     psyllium (METAMUCIL) 58.6 % packet Take 1 packet by mouth daily.     No current facility-administered medications for this visit.     ALLERGIES: Clindamycin and Codeine  Family History  Problem Relation Age of Onset   Hyperlipidemia Mother    Hyperlipidemia Father    Parkinson's  disease Father    Hyperlipidemia Sister    Hyperlipidemia Brother    Thyroid disease Neg Hx    Breast cancer Neg Hx     Social History   Socioeconomic History   Marital status: Married    Spouse name: Not on file   Number of children: Not on file   Years of education: Not on file   Highest education level: Not on file  Occupational History   Not on file  Tobacco Use   Smoking status: Former    Pack years: 0.00    Types: Cigarettes    Quit date: 08/04/1987    Years since quitting: 33.3   Smokeless tobacco: Never  Vaping Use   Vaping Use: Never used  Substance and Sexual Activity   Alcohol use: Yes    Alcohol/week: 14.0 standard drinks    Types: 14 Glasses of wine per week   Drug use: No   Sexual activity: Yes    Partners: Male    Birth control/protection: Post-menopausal  Other Topics Concern   Not on file  Social History Narrative   Not on file   Social Determinants of Health   Financial Resource Strain: Not on file  Food Insecurity: Not on file  Transportation Needs: Not on file  Physical Activity: Not on file  Stress: Not on file  Social Connections: Not on file  Intimate Partner Violence: Not on file    ROS  PHYSICAL EXAMINATION:    BP 104/60 (BP Location: Right Arm, Patient Position: Sitting, Cuff Size: Normal)   LMP 12/29/2013     General appearance: alert, cooperative and appears stated age  Pelvic: External genitalia:  no lesions              Urethra:  normal appearing urethra with no masses, tenderness or lesions              Bartholins and Skenes: normal                 Vagina: the pessary was removed and cleaned, no vaginal irritation, the pessary was replaced.               Cervix: no lesions  Chaperone was present for exam.  1. Pessary maintenance Doing well, f/u in 3 months

## 2021-02-20 ENCOUNTER — Ambulatory Visit: Payer: BC Managed Care – PPO | Admitting: Obstetrics and Gynecology

## 2021-02-27 ENCOUNTER — Ambulatory Visit: Payer: BC Managed Care – PPO | Admitting: Obstetrics and Gynecology

## 2021-02-27 ENCOUNTER — Other Ambulatory Visit: Payer: Self-pay

## 2021-02-27 ENCOUNTER — Encounter: Payer: Self-pay | Admitting: Obstetrics and Gynecology

## 2021-02-27 VITALS — BP 112/62 | HR 71 | Ht 67.0 in | Wt 143.0 lb

## 2021-02-27 DIAGNOSIS — N814 Uterovaginal prolapse, unspecified: Secondary | ICD-10-CM

## 2021-02-27 DIAGNOSIS — Z4689 Encounter for fitting and adjustment of other specified devices: Secondary | ICD-10-CM

## 2021-02-27 NOTE — Progress Notes (Signed)
GYNECOLOGY  VISIT   HPI: 65 y.o.   Married White or Caucasian Not Hispanic or Latino  female   G1P1001 with Patient's last menstrual period was 12/29/2013.   here for pessary maintenance. She has a #4 ring pessary for a grade 2 uterine prolapse and a small grade 2 rectocele. No problems. Takes the pessary out one time a week overnight.  Sexually active, takes the pessary out. No pain. No vaginal bleeding. No bowel or bladder changes.    GYNECOLOGIC HISTORY: Patient's last menstrual period was 12/29/2013. Contraception:PMP Menopausal hormone therapy: none         OB History     Gravida  1   Para  1   Term  1   Preterm  0   AB  0   Living  1      SAB      IAB      Ectopic      Multiple      Live Births  1              Patient Active Problem List   Diagnosis Date Noted   Allergic rhinitis 07/18/2020   Chronic maxillary sinusitis 07/18/2020   Hypertensive retinopathy 07/18/2020   Incontinence of feces 07/18/2020   Mild intermittent asthma 07/18/2020   Pure hypercholesterolemia 07/18/2020   Rectocele 07/18/2020   Thyroid nodule 07/18/2020   Tinnitus 07/18/2020   Uterine prolapse 07/18/2020   Snoring 06/30/2016   Multinodular goiter 02/19/2015   IUD (intrauterine device) in place 01/29/2014   Fibroid, uterine 11/24/2013   Menorrhagia, premenopausal 11/24/2013   Personal history of colonic polyps 04/23/2011   Diverticulitis of colon with perforation 04/20/2011   Sjoegren syndrome 04/17/2011   Asthma 04/17/2011    Past Medical History:  Diagnosis Date   Arthritis    Asthma    Blood transfusion    AS INFANT   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   Diverticulitis of colon with perforation 04/20/2011   Heart murmur    Medical history non-contributory    Multinodular goiter    Osteopenia    Palpitations    CARDIAC STUDIES DONE 2009 - WAS TOLD THEY WERE NORMAL   Personal history of colonic polyps 04/23/2011   Rash, skin    AROUND STOMA   Sjogren's  syndrome ([place])     Past Surgical History:  Procedure Laterality Date   COLON RESECTION  04/22/2011   Procedure: COLON RESECTION;  Surgeon: Adin Hector, MD;  [facility];  Service: General;  Laterality: N/A;  colon resection, drainage of abscess   COLON SURGERY  04/2011,07/2011   colostomy take down   COLOSTOMY  04/22/2011   Procedure: COLOSTOMY;  Surgeon: Adin Hector, MD;  [facility];  Service: General;  Laterality: N/A;   DILATATION & CURETTAGE/HYSTEROSCOPY WITH TRUECLEAR N/A 12/19/2013   Procedure: DILATATION & CURETTAGE/HYSTEROSCOPY WITH TRUCLEAR;  Surgeon: Azalia Bilis, MD;  Location: [place] [facility];  Service: Gynecology;  Laterality: N/A;   FOREIGN REMOVED  AGE 59   PEANUT REMOVED FROM LUNG   HERNIA REPAIR     KNEE MASS EXCISION  1970'S   OSTEOCHONDROMA REMOVED   myoma on right thigh  2002 (summer)   PROCTOSCOPY  08/14/2011   Procedure: PROCTOSCOPY;  Surgeon: Adin Hector, MD;  [facility];  Service: General;  Laterality: N/A;  rigid    Current Outpatient Medications  Medication Sig Dispense Refill   albuterol (VENTOLIN HFA) 108 (90 Base) MCG/ACT inhaler 2 puffs  Cholecalciferol (VITAMIN D) 2000 UNITS tablet Take 1,000 Units by mouth daily.     FLOVENT HFA 110 MCG/ACT inhaler Inhale 2 puffs into the lungs 2 (two) times daily.     fluticasone (FLONASE) 50 MCG/ACT nasal spray Place 2 sprays into both nostrils daily.     methylcellulose (ARTIFICIAL TEARS) 1 % ophthalmic solution Place 1 drop into both eyes daily.     Multiple Vitamins-Minerals (MULTIVITAMINS THER. W/MINERALS) TABS Take 1 tablet by mouth daily.     psyllium (METAMUCIL) 58.6 % packet Take 1 packet by mouth daily.     No current facility-administered medications for this visit.     ALLERGIES: Clindamycin and Codeine  Family History  Problem Relation Age of Onset   Hyperlipidemia Mother    Hyperlipidemia Father    Parkinson's disease Father    Hyperlipidemia Sister    Hyperlipidemia  Brother    Thyroid disease Neg Hx    Breast cancer Neg Hx     Social History   Socioeconomic History   Marital status: Married    Spouse name: Not on file   Number of children: Not on file   Years of education: Not on file   Highest education level: Not on file  Occupational History   Not on file  Tobacco Use   Smoking status: Former    Types: Cigarettes    Quit date: 08/04/1987    Years since quitting: 33.5   Smokeless tobacco: Never  Vaping Use   Vaping Use: Never used  Substance and Sexual Activity   Alcohol use: Yes    Alcohol/week: 14.0 standard drinks    Types: 14 Glasses of wine per week   Drug use: No   Sexual activity: Yes    Partners: Male    Birth control/protection: Post-menopausal  Other Topics Concern   Not on file  Social History Narrative   Not on file   Social Determinants of Health   Financial Resource Strain: Not on file  Food Insecurity: Not on file  Transportation Needs: Not on file  Physical Activity: Not on file  Stress: Not on file  Social Connections: Not on file  Intimate Partner Violence: Not on file    Review of Systems  All other systems reviewed and are negative.  PHYSICAL EXAMINATION:    BP 112/62   Pulse 71   Ht 5\' 7"  (1.702 m)   Wt 143 lb (64.9 kg)   LMP 12/29/2013   SpO2 98%   BMI 22.40 kg/m     General appearance: alert, cooperative and appears stated age  Pelvic: External genitalia:  no lesions              Urethra:  normal appearing urethra with no masses, tenderness or lesions              Bartholins and Skenes: normal                 Vagina: the pessary was removed and cleaned. Mild vaginal atrophy, no vaginal irritation. There was slight irritation at the introitus from removing the pessary. The pessary was replaced.               Cervix: no lesions               Chaperone was present for exam.  1. Pessary maintenance Doing well  F/u in 12/22 for an annual exam.

## 2021-04-10 ENCOUNTER — Other Ambulatory Visit: Payer: Self-pay | Admitting: Family Medicine

## 2021-04-10 ENCOUNTER — Ambulatory Visit: Payer: BC Managed Care – PPO | Admitting: Obstetrics and Gynecology

## 2021-04-10 DIAGNOSIS — Z1231 Encounter for screening mammogram for malignant neoplasm of breast: Secondary | ICD-10-CM

## 2021-05-06 NOTE — Progress Notes (Signed)
65 y.o. G67P1001 Married White or Caucasian Not Hispanic or Latino female here for annual exam.  She reports occasional episodes of spotting, doesn't think she is having irritation from the pessary. It has happened ~3 x in the last 3 months.    She has a #4 ring pessary for a grade 2 uterine prolapse and small grade 2 rectocele. She takes the pessary out overnight 1 time a week.   Sexually active, takes the pessary out, no problems.   No bowel changes, intermittent issues with loose stools.   No urinary c/o.   Patient's last menstrual period was 12/29/2013.          Sexually active: Yes.    The current method of family planning is post menopausal status.    Exercising: Yes.     Stretching and walking  Smoker:  no  Health Maintenance: Pap:  03/10/2018 WNL NEG HPV; 01-24-15 WNL NEG HR HPV  History of abnormal Pap:  yes in her teens  MMG:  05/13/20 density C Bi-rads 1 neg, scheduled for next week.  BMD:  03/26/20 osteopenia, T score -2.1. FRAX 8.4/0.9% Colonoscopy: 09-24-11 repeat in 10 yrs  TDaP:  PCP follows per patient  Gardasil: NA   reports that she quit smoking about 33 years ago. Her smoking use included cigarettes. She has never used smokeless tobacco. She reports current alcohol use of about 14.0 standard drinks per week. She reports that she does not use drugs. 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 Husband is in the Physics department. 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   Diverticulitis of colon with perforation 04/20/2011   Heart murmur    Medical history non-contributory    Multinodular goiter    Osteopenia    Palpitations    CARDIAC STUDIES DONE 2009 - WAS TOLD THEY WERE NORMAL   Personal history of colonic polyps 04/23/2011    Rash, skin    AROUND STOMA   Sjogren's syndrome ([place])     Past Surgical History:  Procedure Laterality Date   COLON RESECTION  04/22/2011   Procedure: COLON RESECTION;  Surgeon: Adin Hector, MD;  Location: [facility];  Service: General;  Laterality: N/A;  colon resection, drainage of abscess   COLON SURGERY  04/2011,07/2011   colostomy take down   COLOSTOMY  04/22/2011   Procedure: COLOSTOMY;  Surgeon: Adin Hector, MD;  Location: [facility];  Service: General;  Laterality: N/A;   DILATATION & CURETTAGE/HYSTEROSCOPY WITH TRUECLEAR N/A 12/19/2013   Procedure: DILATATION & CURETTAGE/HYSTEROSCOPY WITH TRUCLEAR;  Surgeon: Azalia Bilis, MD;  Location: [place] [facility];  Service: Gynecology;  Laterality: N/A;   FOREIGN REMOVED  AGE 73   PEANUT REMOVED FROM LUNG   HERNIA REPAIR     KNEE MASS EXCISION  1970'S   OSTEOCHONDROMA REMOVED   myoma on right thigh  2002 (summer)   PROCTOSCOPY  08/14/2011   Procedure: PROCTOSCOPY;  Surgeon: Adin Hector, MD;  [facility]: [facility];  Service: General;  Laterality: N/A;  rigid    Current Outpatient Medications  Medication Sig Dispense Refill   albuterol (VENTOLIN HFA) 108 (90 Base) MCG/ACT inhaler 2 puffs     calcium carbonate (OSCAL) 1500 (600 Ca)  MG TABS tablet 1 tablet with meals     Cholecalciferol (VITAMIN D) 2000 UNITS tablet Take 1,000 Units by mouth daily.     FLOVENT HFA 110 MCG/ACT inhaler Inhale 2 puffs into the lungs 2 (two) times daily.     fluticasone (FLONASE) 50 MCG/ACT nasal spray Place 2 sprays into both nostrils daily.     methylcellulose (ARTIFICIAL TEARS) 1 % ophthalmic solution Place 1 drop into both eyes daily.     Multiple Vitamins-Minerals (MULTIVITAMINS THER. W/MINERALS) TABS Take 1 tablet by mouth daily.     psyllium (METAMUCIL) 58.6 % packet Take 1 packet by mouth daily.     No current facility-administered medications for this visit.    Family History  Problem Relation Age of Onset   Hyperlipidemia Mother    Hyperlipidemia  Father    Parkinson's disease Father    Hyperlipidemia Sister    Hyperlipidemia Brother    Thyroid disease Neg Hx    Breast cancer Neg Hx     Review of Systems  All other systems reviewed and are negative.  Exam:   BP 104/62   Pulse 67   Ht 5\' 7"  (1.702 m)   Wt 144 lb (65.3 kg)   LMP 12/29/2013   SpO2 99%   BMI 22.55 kg/m   Weight change: @WEIGHTCHANGE @ Height:   Height: 5\' 7"  (170.2 cm)  Ht Readings from Last 3 Encounters:  05/13/21 5\' 7"  (1.702 m)  02/27/21 5\' 7"  (1.702 m)  08/15/20 5\' 7"  (1.702 m)    General appearance: alert, cooperative and appears stated age Head: Normocephalic, without obvious abnormality, atraumatic Neck: no adenopathy, supple, symmetrical, trachea midline and thyroid normal to inspection and palpation Lungs: clear to auscultation bilaterally Cardiovascular: regular rate and rhythm Breasts: normal appearance, no masses or tenderness Abdomen: soft, non-tender; non distended,  no masses,  no organomegaly Extremities: extremities normal, atraumatic, no cyanosis or edema Skin: Skin color, texture, turgor normal. No rashes or lesions Lymph nodes: Cervical, supraclavicular, and axillary nodes normal. No abnormal inguinal nodes palpated Neurologic: Grossly normal   Pelvic: External genitalia:  she has erythema and atrophy at the hymenal opening              Urethra:  normal appearing urethra with no masses, tenderness or lesions, no caruncle              Bartholins and Skenes: normal                 Vagina: the pessary was removed and cleaned, mild atrophy, no irritation.              Cervix: no lesions               Bimanual Exam:  Uterus:  normal size, contour, position, consistency, mobility, non-tender and anteverted              Adnexa: no mass, fullness, tenderness               Rectovaginal: Confirms               Anus:  normal sphincter tone, no lesions  Gae Dry chaperoned for the exam.  1. Well woman exam Mammogram next  week Discussed breast self awareness Labs with primary  2. Pessary maintenance Doing well  3. Postmenopausal bleeding - US PELVIS TRANSVAGINAL NON-OB (TV ONLY); Future - Cytology - PAP  4. History of osteopenia DEXA due next fall with primary Discussed calcium and vit d  5.  Colon cancer screening She will set up her colonoscopy in 4/23  6. Vaginal irritation Just at the opening of her vagina - estradiol (ESTRACE) 0.1 MG/GM vaginal cream; Apply a pea sized amount just inside the vaginally opening qd for one week then 3 days a week.  Dispense: 42.5 g; Refill: 1 -F/U in one month  7. Vaginal atrophy Only irritated at the hymen - estradiol (ESTRACE) 0.1 MG/GM vaginal cream; Apply a pea sized amount just inside the vaginally opening qd for one week then 3 days a week.  Dispense: 42.5 g; Refill: 1  8. Screening for cervical cancer - Cytology - PAP

## 2021-05-13 ENCOUNTER — Encounter: Payer: Self-pay | Admitting: Obstetrics and Gynecology

## 2021-05-13 ENCOUNTER — Other Ambulatory Visit (HOSPITAL_COMMUNITY)
Admission: RE | Admit: 2021-05-13 | Discharge: 2021-05-13 | Disposition: A | Discharge status: Home / Self Care | Payer: BC Managed Care – PPO | Source: Ambulatory Visit | Attending: Obstetrics and Gynecology | Admitting: Obstetrics and Gynecology

## 2021-05-13 ENCOUNTER — Ambulatory Visit (INDEPENDENT_AMBULATORY_CARE_PROVIDER_SITE_OTHER): Payer: BC Managed Care – PPO | Admitting: Obstetrics and Gynecology

## 2021-05-13 ENCOUNTER — Other Ambulatory Visit: Payer: Self-pay

## 2021-05-13 VITALS — BP 104/62 | HR 67 | Ht 67.0 in | Wt 144.0 lb

## 2021-05-13 DIAGNOSIS — Z8739 Personal history of other diseases of the musculoskeletal system and connective tissue: Secondary | ICD-10-CM | POA: Diagnosis not present

## 2021-05-13 DIAGNOSIS — N950 Postmenopausal bleeding: Secondary | ICD-10-CM | POA: Insufficient documentation

## 2021-05-13 DIAGNOSIS — Z124 Encounter for screening for malignant neoplasm of cervix: Secondary | ICD-10-CM | POA: Diagnosis present

## 2021-05-13 DIAGNOSIS — N898 Other specified noninflammatory disorders of vagina: Secondary | ICD-10-CM

## 2021-05-13 DIAGNOSIS — Z4689 Encounter for fitting and adjustment of other specified devices: Secondary | ICD-10-CM

## 2021-05-13 DIAGNOSIS — N952 Postmenopausal atrophic vaginitis: Secondary | ICD-10-CM

## 2021-05-13 DIAGNOSIS — Z01419 Encounter for gynecological examination (general) (routine) without abnormal findings: Secondary | ICD-10-CM | POA: Diagnosis not present

## 2021-05-13 DIAGNOSIS — Z1211 Encounter for screening for malignant neoplasm of colon: Secondary | ICD-10-CM

## 2021-05-13 MED ORDER — ESTRADIOL 0.1 MG/GM VA CREA: TOPICAL_CREAM | VAGINAL | 1 refills | Status: DC

## 2021-05-13 NOTE — Patient Instructions (Signed)

## 2021-05-14 LAB — CYTOLOGY - PAP
Comment: NEGATIVE
Diagnosis: NEGATIVE
High risk HPV: NEGATIVE

## 2021-05-19 ENCOUNTER — Ambulatory Visit
Admission: RE | Admit: 2021-05-19 | Discharge: 2021-05-19 | Disposition: A | Discharge status: Home / Self Care | Payer: BC Managed Care – PPO | Source: Ambulatory Visit | Attending: Family Medicine | Admitting: Family Medicine

## 2021-05-19 DIAGNOSIS — Z1231 Encounter for screening mammogram for malignant neoplasm of breast: Secondary | ICD-10-CM

## 2021-05-29 ENCOUNTER — Other Ambulatory Visit: Payer: Self-pay

## 2021-05-29 ENCOUNTER — Telehealth: Payer: Self-pay | Admitting: Obstetrics and Gynecology

## 2021-05-29 ENCOUNTER — Ambulatory Visit: Payer: BC Managed Care – PPO

## 2021-05-29 DIAGNOSIS — N950 Postmenopausal bleeding: Secondary | ICD-10-CM

## 2021-05-29 NOTE — Telephone Encounter (Signed)
Spoke with patient and informed her. °

## 2021-05-29 NOTE — Telephone Encounter (Signed)
Please let the patient know that her ultrasound from earlier today looks good. Her endometrium is very thin (as it should be), her fibroids are slightly smaller and her ovaries are normal. She should call if she has continued vaginal bleeding.

## 2021-06-10 ENCOUNTER — Other Ambulatory Visit: Payer: Self-pay

## 2021-06-10 ENCOUNTER — Encounter: Payer: Self-pay | Admitting: Obstetrics and Gynecology

## 2021-06-10 ENCOUNTER — Ambulatory Visit: Payer: BC Managed Care – PPO | Admitting: Obstetrics and Gynecology

## 2021-06-10 VITALS — BP 120/74

## 2021-06-10 DIAGNOSIS — N898 Other specified noninflammatory disorders of vagina: Secondary | ICD-10-CM | POA: Diagnosis not present

## 2021-06-10 DIAGNOSIS — Z4689 Encounter for fitting and adjustment of other specified devices: Secondary | ICD-10-CM | POA: Diagnosis not present

## 2021-06-10 NOTE — Progress Notes (Signed)
GYNECOLOGY  VISIT   HPI: 66 y.o.   Married White or Caucasian Not Hispanic or Latino  female   G1P1001 with Patient's last menstrual period was 12/29/2013.   here for 4 week  follow up for vaginal irritation from her pessary. She has been using a #4 ring pessary for a grade 2 uterine prolapse and a small grade 2 rectocele. At the time of her visit last month she reported taking the pessary out over night 1 x a week. She also reported some spotting. She was noted to have irritation at the opening of her vagina. She was started on estrace cream, just externally and was set up for an ultrasound.   U/S on 05/29/21 showed a thin, symmetrical endometrial stripe of 2.05 mm.  Since using the cream she hasn't noticed any spotting. No issues with the pessary. She also feels more comfortable since using the estrogen cream.    GYNECOLOGIC HISTORY: Patient's last menstrual period was 12/29/2013. Contraception:none Menopausal hormone therapy: estrogen vaginal cream        OB History     Gravida  1   Para  1   Term  1   Preterm  0   AB  0   Living  1      SAB      IAB      Ectopic      Multiple      Live Births  1              Patient Active Problem List   Diagnosis Date Noted   Allergic rhinitis 07/18/2020   Chronic maxillary sinusitis 07/18/2020   Hypertensive retinopathy 07/18/2020   Incontinence of feces 07/18/2020   Mild intermittent asthma 07/18/2020   Pure hypercholesterolemia 07/18/2020   Rectocele 07/18/2020   Thyroid nodule 07/18/2020   Tinnitus 07/18/2020   Uterine prolapse 07/18/2020   Snoring 06/30/2016   Multinodular goiter 02/19/2015   Fibroid, uterine 11/24/2013   Menorrhagia, premenopausal 11/24/2013   Personal history of colonic polyps 04/23/2011   Diverticulitis of colon with perforation 04/20/2011   Sjoegren syndrome 04/17/2011   Asthma 04/17/2011    Past Medical History:  Diagnosis Date   Arthritis    Asthma    Blood transfusion    AS  INFANT   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   Diverticulitis of colon with perforation 04/20/2011   Heart murmur    Medical history non-contributory    Multinodular goiter    Osteopenia    Palpitations    CARDIAC STUDIES DONE 2009 - WAS TOLD THEY WERE NORMAL   Personal history of colonic polyps 04/23/2011   Rash, skin    AROUND STOMA   Sjogren's syndrome ([place])     Past Surgical History:  Procedure Laterality Date   COLON RESECTION  04/22/2011   Procedure: COLON RESECTION;  Surgeon: Adin Hector, MD;  [facility];  Service: General;  Laterality: N/A;  colon resection, drainage of abscess   COLON SURGERY  04/2011,07/2011   colostomy take down   COLOSTOMY  04/22/2011   Procedure: COLOSTOMY;  Surgeon: Adin Hector, MD;  [facility];  Service: General;  Laterality: N/A;   DILATATION & CURETTAGE/HYSTEROSCOPY WITH TRUECLEAR N/A 12/19/2013   Procedure: DILATATION & CURETTAGE/HYSTEROSCOPY WITH TRUCLEAR;  Surgeon: Azalia Bilis, MD;  Location: [facility];  Service: Gynecology;  Laterality: N/A;   FOREIGN REMOVED  AGE 49   PEANUT REMOVED FROM LUNG   HERNIA REPAIR     KNEE  MASS EXCISION  1970'S   OSTEOCHONDROMA REMOVED   myoma on right thigh  2002 (summer)   PROCTOSCOPY  08/14/2011   Procedure: PROCTOSCOPY;  Surgeon: Adin Hector, MD;  [facility];  Service: General;  Laterality: N/A;  rigid    Current Outpatient Medications  Medication Sig Dispense Refill   albuterol (VENTOLIN HFA) 108 (90 Base) MCG/ACT inhaler 2 puffs     calcium carbonate (OSCAL) 1500 (600 Ca) MG TABS tablet 1 tablet with meals     Cholecalciferol (VITAMIN D) 2000 UNITS tablet Take 1,000 Units by mouth daily.     estradiol (ESTRACE) 0.1 MG/GM vaginal cream Apply a pea sized amount just inside the vaginally opening qd for one week then 3 days a week. 42.5 g 1   FLOVENT HFA 110 MCG/ACT inhaler Inhale 2 puffs into the lungs 2 (two) times daily.     fluticasone (FLONASE) 50 MCG/ACT nasal spray Place 2  sprays into both nostrils daily.     methylcellulose (ARTIFICIAL TEARS) 1 % ophthalmic solution Place 1 drop into both eyes daily.     Multiple Vitamins-Minerals (MULTIVITAMINS THER. W/MINERALS) TABS Take 1 tablet by mouth daily.     psyllium (METAMUCIL) 58.6 % packet Take 1 packet by mouth daily.     No current facility-administered medications for this visit.     ALLERGIES: Clindamycin, Codeine, and Hydroxychloroquine  Family History  Problem Relation Age of Onset   Hyperlipidemia Mother    Hyperlipidemia Father    Parkinson's disease Father    Hyperlipidemia Sister    Hyperlipidemia Brother    Thyroid disease Neg Hx    Breast cancer Neg Hx     Social History   Socioeconomic History   Marital status: Married    Spouse name: Not on file   Number of children: Not on file   Years of education: Not on file   Highest education level: Not on file  Occupational History   Not on file  Tobacco Use   Smoking status: Former    Types: Cigarettes    Quit date: 08/04/1987    Years since quitting: 33.8   Smokeless tobacco: Never  Vaping Use   Vaping Use: Never used  Substance and Sexual Activity   Alcohol use: Yes    Alcohol/week: 14.0 standard drinks    Types: 14 Glasses of wine per week   Drug use: No   Sexual activity: Yes    Partners: Male    Birth control/protection: Post-menopausal  Other Topics Concern   Not on file  Social History Narrative   Not on file   Social Determinants of Health   Financial Resource Strain: Not on file  Food Insecurity: Not on file  Transportation Needs: Not on file  Physical Activity: Not on file  Stress: Not on file  Social Connections: Not on file  Intimate Partner Violence: Not on file    ROS  PHYSICAL EXAMINATION:    LMP 12/29/2013     General appearance: alert, cooperative and appears stated age   Pelvic: External genitalia:  no lesions, prior area or irritation at the introitus now appears well estrogenized               Urethra:  normal appearing urethra with no masses, tenderness or lesions              Bartholins and Skenes: normal                 Vagina: the pessary was  removed and cleaned. No vaginal irritation. The pessary was replaced              Cervix: no lesions               Chaperone was present for exam.  1. Pessary maintenance Doing well F/U in 3 months  2. Vaginal irritation Irritation at the introitus has resolved with topical estrace cream. Continue to use a pea sized amount of estrace cream 2 x a week

## 2021-09-09 ENCOUNTER — Encounter: Payer: Self-pay | Admitting: Obstetrics and Gynecology

## 2021-09-09 ENCOUNTER — Ambulatory Visit: Payer: BC Managed Care – PPO | Admitting: Obstetrics and Gynecology

## 2021-09-09 VITALS — BP 104/62 | HR 75 | Ht 67.0 in | Wt 145.0 lb

## 2021-09-09 DIAGNOSIS — Z4689 Encounter for fitting and adjustment of other specified devices: Secondary | ICD-10-CM

## 2021-09-09 NOTE — Progress Notes (Signed)
GYNECOLOGY  VISIT ?  ?HPI: ?66 y.o.   Married White or Caucasian Not Hispanic or Latino  female   ?G1P1001 with Patient's last menstrual period was 12/29/2013.   ?here for pessary maintenance. ?She has been using a #4 ring pessary for a grade 2 uterine prolapse and a small grade 2 rectocele. In December she was started on estrace cream, using at the opening of her vagina only.  ?She takes the pessary out 1 x a week overnight. Uses the estrogen 2 x a week. ?No vaginal bleeding.  ?No bowel or bladder c/o.  ? ?GYNECOLOGIC HISTORY: ?Patient's last menstrual period was 12/29/2013. ?Contraception:PMP  ?Menopausal hormone therapy: estrogen vaginal cream  ?       ?OB History   ? ? Gravida  ?1  ? Para  ?1  ? Term  ?1  ? Preterm  ?0  ? AB  ?0  ? Living  ?1  ?  ? ? SAB  ?   ? IAB  ?   ? Ectopic  ?   ? Multiple  ?   ? Live Births  ?1  ?   ?  ?  ?    ? ?Patient Active Problem List  ? Diagnosis Date Noted  ? Allergic rhinitis 07/18/2020  ? Chronic maxillary sinusitis 07/18/2020  ? Hypertensive retinopathy 07/18/2020  ? Incontinence of feces 07/18/2020  ? Mild intermittent asthma 07/18/2020  ? Pure hypercholesterolemia 07/18/2020  ? Rectocele 07/18/2020  ? Thyroid nodule 07/18/2020  ? Tinnitus 07/18/2020  ? Uterine prolapse 07/18/2020  ? Snoring 06/30/2016  ? Multinodular goiter 02/19/2015  ? Fibroid, uterine 11/24/2013  ? Menorrhagia, premenopausal 11/24/2013  ? Personal history of colonic polyps 04/23/2011  ? Diverticulitis of colon with perforation 04/20/2011  ? Sjoegren syndrome 04/17/2011  ? Asthma 04/17/2011  ? ? ?Past Medical History:  ?Diagnosis Date  ? Arthritis   ? Asthma   ? Blood transfusion   ? AS INFANT  ? Colostomy in place ) 04/2011  ? Diverticulitis of colon with perforation 04/20/2011  ? Heart murmur   ? Medical history non-contributory   ? Multinodular goiter   ? Osteopenia   ? Palpitations   ? CARDIAC STUDIES DONE 2009 - WAS TOLD THEY WERE NORMAL  ? Personal history of colonic polyps 04/23/2011  ? Rash,  skin   ? AROUND STOMA  ? Sjogren's syndrome ([place])   ? ? ?Past Surgical History:  ?Procedure Laterality Date  ? COLON RESECTION  04/22/2011  ? Procedure: COLON RESECTION;  Surgeon: Adin Hector, MD;  [facility]: [facility];  Service: General;  Laterality: N/A;  colon resection, drainage of abscess  ? COLON SURGERY  04/2011,07/2011  ? colostomy take down  ? COLOSTOMY  04/22/2011  ? Procedure: COLOSTOMY;  Surgeon: Adin Hector, MD;  [facility];  Service: General;  Laterality: N/A;  ? DILATATION & CURETTAGE/HYSTEROSCOPY WITH TRUECLEAR N/A 12/19/2013  ? Procedure: [place];  Surgeon: Azalia Bilis, MD;  Location: [facility];  Service: Gynecology;  Laterality: N/A;  ? FOREIGN REMOVED  AGE 3  ? PEANUT REMOVED FROM LUNG  ? HERNIA REPAIR    ? KNEE MASS EXCISION  1970'S  ? OSTEOCHONDROMA REMOVED  ? myoma on right thigh  2002 (summer)  ? PROCTOSCOPY  08/14/2011  ? Procedure: PROCTOSCOPY;  Surgeon: Adin Hector, MD;  [facility];  Service: General;  Laterality: N/A;  rigid  ? ? ?Current Outpatient Medications  ?Medication Sig Dispense Refill  ?  albuterol (VENTOLIN HFA) 108 (90 Base) MCG/ACT inhaler 2 puffs    ? calcium carbonate (OSCAL) 1500 (600 Ca) MG TABS tablet 1 tablet with meals    ? Cholecalciferol (VITAMIN D) 2000 UNITS tablet Take 1,000 Units by mouth daily.    ? estradiol (ESTRACE) 0.1 MG/GM vaginal cream Apply a pea sized amount just inside the vaginally opening qd for one week then 3 days a week. 42.5 g 1  ? FLOVENT HFA 110 MCG/ACT inhaler Inhale 2 puffs into the lungs 2 (two) times daily.    ? fluticasone (FLONASE) 50 MCG/ACT nasal spray Place 2 sprays into both nostrils daily.    ? methylcellulose (ARTIFICIAL TEARS) 1 % ophthalmic solution Place 1 drop into both eyes daily.    ? Multiple Vitamins-Minerals (MULTIVITAMINS THER. W/MINERALS) TABS Take 1 tablet by mouth daily.    ? psyllium (METAMUCIL) 58.6 % packet Take 1 packet by mouth daily.    ? ?No current  facility-administered medications for this visit.  ?  ? ?ALLERGIES: Clindamycin, Codeine, and Hydroxychloroquine ? ?Family History  ?Problem Relation Age of Onset  ? Hyperlipidemia Mother   ? Hyperlipidemia Father   ? Parkinson's disease Father   ? Hyperlipidemia Sister   ? Hyperlipidemia Brother   ? Thyroid disease Neg Hx   ? Breast cancer Neg Hx   ? ? ?Social History  ? ?Socioeconomic History  ? Marital status: Married  ?  Spouse name: Not on file  ? Number of children: Not on file  ? Years of education: Not on file  ? Highest education level: Not on file  ?Occupational History  ? Not on file  ?Tobacco Use  ? Smoking status: Former  ?  Types: Cigarettes  ?  Quit date: 08/04/1987  ?  Years since quitting: 34.1  ? Smokeless tobacco: Never  ?Vaping Use  ? Vaping Use: Never used  ?Substance and Sexual Activity  ? Alcohol use: Yes  ?  Alcohol/week: 14.0 standard drinks  ?  Types: 14 Glasses of wine per week  ? Drug use: No  ? Sexual activity: Yes  ?  Partners: Male  ?  Birth control/protection: Post-menopausal  ?Other Topics Concern  ? Not on file  ?Social History Narrative  ? Not on file  ? ?Social Determinants of Health  ? ?Financial Resource Strain: Not on file  ?Food Insecurity: Not on file  ?Transportation Needs: Not on file  ?Physical Activity: Not on file  ?Stress: Not on file  ?Social Connections: Not on file  ?Intimate Partner Violence: Not on file  ? ? ?Review of Systems  ?All other systems reviewed and are negative. ? ?PHYSICAL EXAMINATION:   ? ?BP 104/62   Pulse 75   Ht '5\' 7"'$  (1.702 m)   Wt 145 lb (65.8 kg)   LMP 12/29/2013   SpO2 99%   BMI 22.71 kg/m?     ?General appearance: alert, cooperative and appears stated age ? ?Pelvic: External genitalia:  no lesions ?             Urethra:  normal appearing urethra with no masses, tenderness or lesions ?             Bartholins and Skenes: normal    ?             Vagina: the pessary was removed and cleaned, no vaginal irritation. The pessary was replaced. ?              Cervix: no lesions ?             ?  Chaperone was present for exam. ? ?1. Pessary maintenance ?Doing well ?Continue with current routine ?F/U in 3 months ? ? ?

## 2021-12-09 ENCOUNTER — Ambulatory Visit: Payer: BC Managed Care – PPO | Admitting: Obstetrics and Gynecology

## 2021-12-09 ENCOUNTER — Encounter: Payer: Self-pay | Admitting: Obstetrics and Gynecology

## 2021-12-09 VITALS — BP 110/62 | HR 77 | Wt 143.0 lb

## 2021-12-09 DIAGNOSIS — Z4689 Encounter for fitting and adjustment of other specified devices: Secondary | ICD-10-CM

## 2021-12-09 NOTE — Progress Notes (Signed)
GYNECOLOGY  VISIT   HPI: 66 y.o.   Married White or Caucasian Not Hispanic or Latino  female   G1P1001 with Patient's last menstrual period was 12/29/2013.   here for pessary maintenance. She has a #4 ring pessary for a grade 2 uterine prolapse and a small grade 2 rectocele. In December she was started on estrace cream, using at the opening of her vagina only.  She takes the pessary out 1 x a week overnight. Uses the estrogen 2 x a week. No vaginal bleeding. No bowel or bladder c/o. Sexually active, no pain (takes the pessary out).  GYNECOLOGIC HISTORY: Patient's last menstrual period was 12/29/2013. Contraception:pmp  Menopausal hormone therapy: estrace         OB History     Gravida  1   Para  1   Term  1   Preterm  0   AB  0   Living  1      SAB      IAB      Ectopic      Multiple      Live Births  1              Patient Active Problem List   Diagnosis Date Noted   Allergic rhinitis 07/18/2020   Chronic maxillary sinusitis 07/18/2020   Hypertensive retinopathy 07/18/2020   Incontinence of feces 07/18/2020   Mild intermittent asthma 07/18/2020   Pure hypercholesterolemia 07/18/2020   Rectocele 07/18/2020   Thyroid nodule 07/18/2020   Tinnitus 07/18/2020   Uterine prolapse 07/18/2020   Snoring 06/30/2016   Multinodular goiter 02/19/2015   Fibroid, uterine 11/24/2013   Menorrhagia, premenopausal 11/24/2013   Personal history of colonic polyps 04/23/2011   Diverticulitis of colon with perforation 04/20/2011   Sjoegren syndrome 04/17/2011   Asthma 04/17/2011    Past Medical History:  Diagnosis Date   Arthritis    Asthma    Blood transfusion    AS INFANT   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   Diverticulitis of colon with perforation 04/20/2011   Heart murmur    Medical history non-contributory    Multinodular goiter    Osteopenia    Palpitations    CARDIAC STUDIES DONE 2009 - WAS TOLD THEY WERE NORMAL   Personal history of colonic polyps  04/23/2011   Rash, skin    AROUND STOMA   Sjogren's syndrome ([place])     Past Surgical History:  Procedure Laterality Date   COLON RESECTION  04/22/2011   Procedure: COLON RESECTION;  Surgeon: Adin Hector, MD;  Location: [facility];  Service: General;  Laterality: N/A;  colon resection, drainage of abscess   COLON SURGERY  04/2011,07/2011   colostomy take down   COLOSTOMY  04/22/2011   Procedure: COLOSTOMY;  Surgeon: Adin Hector, MD;  Location: [facility];  Service: General;  Laterality: N/A;   DILATATION & CURETTAGE/HYSTEROSCOPY WITH TRUECLEAR N/A 12/19/2013   Procedure: DILATATION & CURETTAGE/HYSTEROSCOPY WITH TRUCLEAR;  Surgeon: Azalia Bilis, MD;  Location: [place] [facility];  Service: Gynecology;  Laterality: N/A;   FOREIGN REMOVED  AGE 69   PEANUT REMOVED FROM LUNG   HERNIA REPAIR     KNEE MASS EXCISION  1970'S   OSTEOCHONDROMA REMOVED   myoma on right thigh  2002 (summer)   PROCTOSCOPY  08/14/2011   Procedure: PROCTOSCOPY;  Surgeon: Adin Hector, MD;  [facility];  Service: General;  Laterality: N/A;  rigid    Current Outpatient Medications  Medication Sig Dispense  Refill   albuterol (VENTOLIN HFA) 108 (90 Base) MCG/ACT inhaler 2 puffs     calcium carbonate (OSCAL) 1500 (600 Ca) MG TABS tablet 1 tablet with meals     Cholecalciferol (VITAMIN D) 2000 UNITS tablet Take 1,000 Units by mouth daily.     estradiol (ESTRACE) 0.1 MG/GM vaginal cream Apply a pea sized amount just inside the vaginally opening qd for one week then 3 days a week. 42.5 g 1   FLOVENT HFA 110 MCG/ACT inhaler Inhale 2 puffs into the lungs 2 (two) times daily.     fluticasone (FLONASE) 50 MCG/ACT nasal spray Place 2 sprays into both nostrils daily.     methylcellulose (ARTIFICIAL TEARS) 1 % ophthalmic solution Place 1 drop into both eyes daily.     Multiple Vitamins-Minerals (MULTIVITAMINS THER. W/MINERALS) TABS Take 1 tablet by mouth daily.     psyllium (METAMUCIL) 58.6 % packet Take 1 packet by mouth daily.      No current facility-administered medications for this visit.     ALLERGIES: Clindamycin, Codeine, and Hydroxychloroquine  Family History  Problem Relation Age of Onset   Hyperlipidemia Mother    Hyperlipidemia Father    Parkinson's disease Father    Hyperlipidemia Sister    Hyperlipidemia Brother    Thyroid disease Neg Hx    Breast cancer Neg Hx     Social History   Socioeconomic History   Marital status: Married    Spouse name: Not on file   Number of children: Not on file   Years of education: Not on file   Highest education level: Not on file  Occupational History   Not on file  Tobacco Use   Smoking status: Former    Types: Cigarettes    Quit date: 08/04/1987    Years since quitting: 34.3   Smokeless tobacco: Never  Vaping Use   Vaping Use: Never used  Substance and Sexual Activity   Alcohol use: Yes    Alcohol/week: 14.0 standard drinks of alcohol    Types: 14 Glasses of wine per week   Drug use: No   Sexual activity: Yes    Partners: Male    Birth control/protection: Post-menopausal  Other Topics Concern   Not on file  Social History Narrative   Not on file   Social Determinants of Health   Financial Resource Strain: Not on file  Food Insecurity: Not on file  Transportation Needs: Not on file  Physical Activity: Not on file  Stress: Not on file  Social Connections: Not on file  Intimate Partner Violence: Not on file    Review of Systems  All other systems reviewed and are negative.   PHYSICAL EXAMINATION:    BP 110/62   Pulse 77   Wt 143 lb (64.9 kg)   LMP 12/29/2013   SpO2 100%   BMI 22.40 kg/m     General appearance: alert, cooperative and appears stated age  Pelvic: External genitalia:  no lesions              Urethra:  normal appearing urethra with no masses, tenderness or lesions              Bartholins and Skenes: normal                 Vagina: the pessary was removed and cleaned, no vaginal irritation, the pessary was  reinserted              Cervix: no lesions  Chaperone was present for exam.  1. Pessary maintenance Doing well Continue with her current regimen F/U in 3 months

## 2022-03-10 ENCOUNTER — Other Ambulatory Visit: Payer: Self-pay | Admitting: Family Medicine

## 2022-03-10 DIAGNOSIS — Z1231 Encounter for screening mammogram for malignant neoplasm of breast: Secondary | ICD-10-CM

## 2022-03-12 ENCOUNTER — Encounter: Payer: Self-pay | Admitting: Obstetrics and Gynecology

## 2022-03-12 ENCOUNTER — Ambulatory Visit: Payer: BC Managed Care – PPO | Admitting: Obstetrics and Gynecology

## 2022-03-12 VITALS — BP 118/72 | HR 65 | Wt 143.0 lb

## 2022-03-12 DIAGNOSIS — Z4689 Encounter for fitting and adjustment of other specified devices: Secondary | ICD-10-CM

## 2022-03-12 NOTE — Progress Notes (Signed)
GYNECOLOGY  VISIT   HPI: 66 y.o.   Married White or Caucasian Not Hispanic or Latino  female   G1P1001 with Patient's last menstrual period was 12/29/2013.   here for  pessary maintenance.  She has a #4 ring pessary for a grade 2 uterine prolapse and a small grade 2 rectocele. She takes the ring out at night 1 x a night overnight. She uses estrogen cream just at the opening of the vagina 2 x a week (if she remembers). No vaginal bleeding, no bowel or bladder c/o. Sexually active, no pain.   GYNECOLOGIC HISTORY: Patient's last menstrual period was 12/29/2013. Contraception:pmp Menopausal hormone therapy: estrace        OB History     Gravida  1   Para  1   Term  1   Preterm  0   AB  0   Living  1      SAB      IAB      Ectopic      Multiple      Live Births  1              Patient Active Problem List   Diagnosis Date Noted   Allergic rhinitis 07/18/2020   Chronic maxillary sinusitis 07/18/2020   Hypertensive retinopathy 07/18/2020   Incontinence of feces 07/18/2020   Mild intermittent asthma 07/18/2020   Pure hypercholesterolemia 07/18/2020   Rectocele 07/18/2020   Thyroid nodule 07/18/2020   Tinnitus 07/18/2020   Uterine prolapse 07/18/2020   Snoring 06/30/2016   Multinodular goiter 02/19/2015   Fibroid, uterine 11/24/2013   Menorrhagia, premenopausal 11/24/2013   Personal history of colonic polyps 04/23/2011   Diverticulitis of colon with perforation 04/20/2011   Sjoegren syndrome 04/17/2011   Asthma 04/17/2011    Past Medical History:  Diagnosis Date   Arthritis    Asthma    Blood transfusion    AS INFANT   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   Diverticulitis of colon with perforation 04/20/2011   Heart murmur    Medical history non-contributory    Multinodular goiter    Osteopenia    Palpitations    CARDIAC STUDIES DONE 2009 - WAS TOLD THEY WERE NORMAL   Personal history of colonic polyps 04/23/2011   Rash, skin    AROUND STOMA    Sjogren's syndrome ([place])     Past Surgical History:  Procedure Laterality Date   COLON RESECTION  04/22/2011   Procedure: COLON RESECTION;  Surgeon: Adin Hector, MD;  [facility];  Service: General;  Laterality: N/A;  colon resection, drainage of abscess   COLON SURGERY  04/2011,07/2011   colostomy take down   COLOSTOMY  04/22/2011   Procedure: COLOSTOMY;  Surgeon: Adin Hector, MD;  [facility];  Service: General;  Laterality: N/A;   DILATATION & CURETTAGE/HYSTEROSCOPY WITH TRUECLEAR N/A 12/19/2013   Procedure: DILATATION & CURETTAGE/HYSTEROSCOPY WITH TRUCLEAR;  Surgeon: Azalia Bilis, MD;  Location: [place] [facility];  Service: Gynecology;  Laterality: N/A;   FOREIGN REMOVED  AGE 66   PEANUT REMOVED FROM LUNG   HERNIA REPAIR     KNEE MASS EXCISION  1970'S   OSTEOCHONDROMA REMOVED   myoma on right thigh  2002 (summer)   PROCTOSCOPY  08/14/2011   Procedure: PROCTOSCOPY;  Surgeon: Adin Hector, MD;  [facility];  Service: General;  Laterality: N/A;  rigid    Current Outpatient Medications  Medication Sig Dispense Refill   albuterol (VENTOLIN HFA) 108 (  90 Base) MCG/ACT inhaler 2 puffs     calcium carbonate (OSCAL) 1500 (600 Ca) MG TABS tablet 1 tablet with meals     Cholecalciferol (VITAMIN D) 2000 UNITS tablet Take 1,000 Units by mouth daily.     estradiol (ESTRACE) 0.1 MG/GM vaginal cream Apply a pea sized amount just inside the vaginally opening qd for one week then 3 days a week. 42.5 g 1   fluticasone (FLONASE) 50 MCG/ACT nasal spray Place 2 sprays into both nostrils daily.     methylcellulose (ARTIFICIAL TEARS) 1 % ophthalmic solution Place 1 drop into both eyes daily.     Multiple Vitamins-Minerals (MULTIVITAMINS THER. W/MINERALS) TABS Take 1 tablet by mouth daily.     psyllium (METAMUCIL) 58.6 % packet Take 1 packet by mouth daily.     PULMICORT FLEXHALER 90 MCG/ACT inhaler Inhale into the lungs.     No current facility-administered medications for this visit.      ALLERGIES: Clindamycin, Codeine, and Hydroxychloroquine  Family History  Problem Relation Age of Onset   Hyperlipidemia Mother    Hyperlipidemia Father    Parkinson's disease Father    Hyperlipidemia Sister    Hyperlipidemia Brother    Thyroid disease Neg Hx    Breast cancer Neg Hx     Social History   Socioeconomic History   Marital status: Married    Spouse name: Not on file   Number of children: Not on file   Years of education: Not on file   Highest education level: Not on file  Occupational History   Not on file  Tobacco Use   Smoking status: Former    Types: Cigarettes    Quit date: 08/04/1987    Years since quitting: 34.6   Smokeless tobacco: Never  Vaping Use   Vaping Use: Never used  Substance and Sexual Activity   Alcohol use: Yes    Alcohol/week: 14.0 standard drinks of alcohol    Types: 14 Glasses of wine per week   Drug use: No   Sexual activity: Yes    Partners: Male    Birth control/protection: Post-menopausal  Other Topics Concern   Not on file  Social History Narrative   Not on file   Social Determinants of Health   Financial Resource Strain: Not on file  Food Insecurity: Not on file  Transportation Needs: Not on file  Physical Activity: Not on file  Stress: Not on file  Social Connections: Not on file  Intimate Partner Violence: Not on file    Review of Systems  All other systems reviewed and are negative.   PHYSICAL EXAMINATION:    BP 118/72   Pulse 65   Wt 143 lb (64.9 kg)   LMP 12/29/2013   SpO2 100%   BMI 22.40 kg/m     General appearance: alert, cooperative and appears stated age  Pelvic: External genitalia:  no lesions              Urethra:  normal appearing urethra with no masses, tenderness or lesions              Bartholins and Skenes: normal                 Vagina: the pessary was removed and cleaned. No vaginal irritation. The pessary was replaced              Cervix: no lesions               Chaperone was  present for exam.  1. Pessary maintenance Doing well, f/u in 3 months for a pessary check and annual exam. She will use the estrogen cream at the introitus 1-2 x a week as needed

## 2022-03-13 ENCOUNTER — Other Ambulatory Visit: Payer: Self-pay | Admitting: Family Medicine

## 2022-03-13 DIAGNOSIS — E2839 Other primary ovarian failure: Secondary | ICD-10-CM

## 2022-04-14 DIAGNOSIS — J339 Nasal polyp, unspecified: Secondary | ICD-10-CM | POA: Insufficient documentation

## 2022-04-14 DIAGNOSIS — J324 Chronic pansinusitis: Secondary | ICD-10-CM | POA: Insufficient documentation

## 2022-04-14 DIAGNOSIS — R430 Anosmia: Secondary | ICD-10-CM | POA: Insufficient documentation

## 2022-05-20 ENCOUNTER — Ambulatory Visit
Admission: RE | Admit: 2022-05-20 | Discharge: 2022-05-20 | Disposition: A | Discharge status: Home / Self Care | Payer: BC Managed Care – PPO | Source: Ambulatory Visit | Attending: Family Medicine | Admitting: Family Medicine

## 2022-05-20 DIAGNOSIS — Z1231 Encounter for screening mammogram for malignant neoplasm of breast: Secondary | ICD-10-CM

## 2022-06-12 NOTE — Progress Notes (Signed)
67 y.o. G103P1001 Married White or Caucasian Not Hispanic or Latino female here for annual exam.  No vaginal bleeding.  She is doing well with her pessary, takes it out overnight 1 x a week. Only uses estrogen externally.  No bowel or bladder c/o. Prior fecal staining has stopped with dietary changes.   Sexually active, no pain.    She has had tinnitus for 20 years.   Patient's last menstrual period was 12/29/2013.          Sexually active: Yes.    The current method of family planning is post menopausal status.    Exercising: Yes.     Walking and stretching  Smoker:  no  Health Maintenance: Pap: 05/13/21 WNL, neg HPV; 03/10/2018 WNL NEG HPV; 01-24-15 WNL NEG HR HPV  History of abnormal Pap:  yes in her teens  MMG:  05/20/22 Bi-rads 1 neg  BMD:  0/26/21 osteopenia, T score -2.1. FRAX 8.4/0.9%. DEXA is scheduled with her primary in 3/24.  Colonoscopy: 09-24-11 repeat in 10 yrs, done at [place].  TDaP:  PCP follows per patient  Gardasil: NA   reports that she quit smoking about 34 years ago. Her smoking use included cigarettes. She has never used smokeless tobacco. She reports current alcohol use of about 14.0 standard drinks of alcohol per week. She reports that she does not use drugs. 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 Husband is in the Physics department. 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   Diverticulitis of colon with perforation 04/20/2011   Heart murmur    Medical history non-contributory    Multinodular goiter    Osteopenia    Palpitations    CARDIAC STUDIES DONE 2009 - WAS TOLD THEY WERE NORMAL   Personal history of colonic polyps 04/23/2011   Rash, skin    AROUND STOMA   Sjogren's syndrome ([place])     Past Surgical History:   Procedure Laterality Date   COLON RESECTION  04/22/2011   Procedure: COLON RESECTION;  Surgeon: Adin Hector, MD;  [facility];  Service: General;  Laterality: N/A;  colon resection, drainage of abscess   COLON SURGERY  04/2011,07/2011   colostomy take down   COLOSTOMY  04/22/2011   Procedure: COLOSTOMY;  Surgeon: Adin Hector, MD;  [facility];  Service: General;  Laterality: N/A;   DILATATION & CURETTAGE/HYSTEROSCOPY WITH TRUECLEAR N/A 12/19/2013   Procedure: DILATATION & CURETTAGE/HYSTEROSCOPY WITH TRUCLEAR;  Surgeon: Azalia Bilis, MD;  Location: [place] [facility];  Service: Gynecology;  Laterality: N/A;   FOREIGN REMOVED  AGE 29   PEANUT REMOVED FROM LUNG   HERNIA REPAIR     KNEE MASS EXCISION  1970'S   OSTEOCHONDROMA REMOVED   myoma on right thigh  2002 (summer)   PROCTOSCOPY  08/14/2011   Procedure: PROCTOSCOPY;  Surgeon: Adin Hector, MD;  [facility];  Service: General;  Laterality: N/A;  rigid    Current Outpatient Medications  Medication Sig Dispense Refill   albuterol (VENTOLIN HFA) 108 (90 Base) MCG/ACT inhaler 2 puffs     calcium carbonate (OSCAL) 1500 (600 Ca) MG TABS tablet 1 tablet with meals     Cholecalciferol (VITAMIN D) 2000 UNITS  tablet Take 1,000 Units by mouth daily.     estradiol (ESTRACE) 0.1 MG/GM vaginal cream Apply a pea sized amount just inside the vaginally opening qd for one week then 3 days a week. 42.5 g 1   methylcellulose (ARTIFICIAL TEARS) 1 % ophthalmic solution Place 1 drop into both eyes daily.     Multiple Vitamins-Minerals (MULTIVITAMINS THER. W/MINERALS) TABS Take 1 tablet by mouth daily.     psyllium (METAMUCIL) 58.6 % packet Take 1 packet by mouth daily.     PULMICORT FLEXHALER 90 MCG/ACT inhaler Inhale into the lungs.     No current facility-administered medications for this visit.    Family History  Problem Relation Age of Onset   Hyperlipidemia Mother    Hyperlipidemia Father    Parkinson's disease Father     Hyperlipidemia Sister    Hyperlipidemia Brother    Thyroid disease Neg Hx    Breast cancer Neg Hx     Review of Systems  All other systems reviewed and are negative.   Exam:   BP 128/74   Pulse 66   Ht '5\' 7"'$  (1.702 m)   Wt 145 lb (65.8 kg)   LMP 12/29/2013   SpO2 100%   BMI 22.71 kg/m   Weight change: '@WEIGHTCHANGE'$ @ Height:   Height: '5\' 7"'$  (170.2 cm)  Ht Readings from Last 3 Encounters:  06/18/22 '5\' 7"'$  (1.702 m)  09/09/21 '5\' 7"'$  (1.702 m)  05/13/21 '5\' 7"'$  (1.702 m)    General appearance: alert, cooperative and appears stated age Head: Normocephalic, without obvious abnormality, atraumatic Neck: no adenopathy, supple, symmetrical, trachea midline and thyroid normal to inspection and palpation Lungs: clear to auscultation bilaterally Cardiovascular: regular rate and rhythm Breasts: normal appearance, no masses or tenderness Abdomen: soft, non-tender; non distended,  no masses,  no organomegaly Extremities: extremities normal, atraumatic, no cyanosis or edema Skin: Skin color, texture, turgor normal. No rashes or lesions Lymph nodes: Cervical, supraclavicular, and axillary nodes normal. No abnormal inguinal nodes palpated Neurologic: Grossly normal   Pelvic: External genitalia:  no lesions              Urethra:  normal appearing urethra with no masses, tenderness or lesions              Bartholins and Skenes: normal                 Vagina: the pessary was removed and cleaned, no vaginal irritation. The pessary was replaced.               Cervix: no lesions               Bimanual Exam:  Uterus:  normal size, contour, position, consistency, mobility, non-tender              Adnexa: no mass, fullness, tenderness               Rectovaginal: Confirms               Anus:  normal sphincter tone, no lesions  Raquel Sarna, CMA chaperoned for the exam.  1. Well woman exam Discussed breast self exam Discussed calcium and vit D intake Pap is UTD Mammogram is UTD She will schedule her  colonoscopy DEXA with primary Labs with primary  2. Pessary maintenance Doing well F/U in 3 months

## 2022-06-18 ENCOUNTER — Ambulatory Visit (INDEPENDENT_AMBULATORY_CARE_PROVIDER_SITE_OTHER): Payer: BC Managed Care – PPO | Admitting: Obstetrics and Gynecology

## 2022-06-18 ENCOUNTER — Encounter: Payer: Self-pay | Admitting: Obstetrics and Gynecology

## 2022-06-18 VITALS — BP 128/74 | HR 66 | Ht 67.0 in | Wt 145.0 lb

## 2022-06-18 DIAGNOSIS — Z4689 Encounter for fitting and adjustment of other specified devices: Secondary | ICD-10-CM

## 2022-06-18 DIAGNOSIS — Z01419 Encounter for gynecological examination (general) (routine) without abnormal findings: Secondary | ICD-10-CM | POA: Diagnosis not present

## 2022-06-18 NOTE — Patient Instructions (Signed)

## 2022-08-27 ENCOUNTER — Ambulatory Visit
Admission: RE | Admit: 2022-08-27 | Discharge: 2022-08-27 | Disposition: A | Discharge status: Home / Self Care | Payer: BC Managed Care – PPO | Source: Ambulatory Visit | Attending: Family Medicine | Admitting: Family Medicine

## 2022-08-27 DIAGNOSIS — E2839 Other primary ovarian failure: Secondary | ICD-10-CM

## 2022-09-17 ENCOUNTER — Ambulatory Visit: Payer: BC Managed Care – PPO | Admitting: Obstetrics and Gynecology

## 2022-09-17 ENCOUNTER — Encounter: Payer: Self-pay | Admitting: Obstetrics and Gynecology

## 2022-09-17 VITALS — BP 108/62 | HR 72 | Resp 13 | Ht 67.0 in | Wt 141.0 lb

## 2022-09-17 DIAGNOSIS — Z4689 Encounter for fitting and adjustment of other specified devices: Secondary | ICD-10-CM | POA: Diagnosis not present

## 2022-09-17 NOTE — Progress Notes (Signed)
GYNECOLOGY  VISIT   HPI: 67 y.o.   Married White or Caucasian Not Hispanic or Latino  female   G1P1001 with Patient's last menstrual period was 12/29/2013.   here for 3 month pessary check. She has a h/o a grade 2 uterine prolapse and a small grade 2 rectocele. She uses a #4 ring pessary with support, she takes it out overnight 1 x a week. Only uses estrogen externally at the opening of the vagina (for comfort). No vaginal bleeding, no abnormal d/c.  Sexually active, no pain.   GYNECOLOGIC HISTORY: Patient's last menstrual period was 12/29/2013. 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   Nasal polyps 04/14/2022   Chronic pansinusitis 04/14/2022   Allergic rhinitis 07/18/2020   Chronic maxillary sinusitis 07/18/2020   Hypertensive retinopathy 07/18/2020   Incontinence of feces 07/18/2020   Mild intermittent asthma 07/18/2020   Pure hypercholesterolemia 07/18/2020   Rectocele 07/18/2020   Thyroid nodule 07/18/2020   Tinnitus 07/18/2020   Uterine prolapse 07/18/2020   Snoring 06/30/2016   Multinodular goiter 02/19/2015   Fibroid, uterine 11/24/2013   Menorrhagia, premenopausal 11/24/2013   Personal history of colonic polyps 04/23/2011   Diverticulitis of colon with perforation 04/20/2011   Sjoegren syndrome 04/17/2011   Asthma 04/17/2011    Past Medical History:  Diagnosis Date   Arthritis    Asthma    Blood transfusion    AS INFANT   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   Diverticulitis of colon with perforation 04/20/2011   Heart murmur    Medical history non-contributory    Multinodular goiter    Osteopenia    Palpitations    CARDIAC STUDIES DONE 2009 - WAS TOLD THEY WERE NORMAL    Personal history of colonic polyps 04/23/2011   Rash, skin    AROUND STOMA   Sjogren's syndrome     Past Surgical History:  Procedure Laterality Date   COLON RESECTION  04/22/2011   Procedure: COLON RESECTION;  Surgeon: [name], MD;  [facility]: [facility];  Service: General;  Laterality: N/A;  colon resection, drainage of abscess   COLON SURGERY  04/2011,07/2011   colostomy take down   COLOSTOMY  04/22/2011   Procedure: COLOSTOMY;  Surgeon: [name], MD;  [facility];  Service: General;  Laterality: N/A;   DILATATION & CURETTAGE/HYSTEROSCOPY WITH TRUECLEAR N/A 12/19/2013   Procedure: DILATATION & CURETTAGE/HYSTEROSCOPY WITH TRUCLEAR;  Surgeon: Bennye Alm, MD;  Location: [facility];  Service: Gynecology;  Laterality: N/A;   FOREIGN REMOVED  AGE 69   PEANUT REMOVED FROM LUNG   HERNIA REPAIR     KNEE MASS EXCISION  1970'S   OSTEOCHONDROMA REMOVED   myoma on right thigh  2002 (summer)   PROCTOSCOPY  08/14/2011   Procedure: PROCTOSCOPY;  Surgeon: [name], MD;  [facility];  Service: General;  Laterality: N/A;  rigid    Current Outpatient Medications  Medication Sig Dispense Refill   albuterol (VENTOLIN HFA) 108 (90 Base) MCG/ACT inhaler 2 puffs     alendronate (FOSAMAX) 10 MG tablet      budesonide (PULMICORT) 0.5 MG/2ML nebulizer solution Take by nebulization.     calcium carbonate (OSCAL) 1500 (600 Ca) MG TABS tablet 1 tablet with meals     Cholecalciferol (VITAMIN D) 2000 UNITS tablet Take 1,000 Units by mouth daily.     estradiol (ESTRACE) 0.1 MG/GM vaginal cream Apply a pea sized amount just inside the vaginally opening qd for one week then 3 days a week. 42.5 g 1   methylcellulose (ARTIFICIAL TEARS) 1 % ophthalmic solution Place 1 drop into both eyes daily.     Multiple Vitamins-Minerals (MULTIVITAMINS THER. W/MINERALS) TABS Take 1 tablet by mouth daily.     psyllium (METAMUCIL) 58.6 % packet Take 1 packet by mouth daily.     PULMICORT FLEXHALER 180  MCG/ACT inhaler      No current facility-administered medications for this visit.     ALLERGIES: Clindamycin, Codeine, and Hydroxychloroquine  Family History  Problem Relation Age of Onset   Hyperlipidemia Mother    Hyperlipidemia Father    Parkinson's disease Father    Hyperlipidemia Sister    Hyperlipidemia Brother    Thyroid disease Neg Hx    Breast cancer Neg Hx     Social History   Socioeconomic History   Marital status: Married    Spouse name: Not on file   Number of children: Not on file   Years of education: Not on file   Highest education level: Not on file  Occupational History   Not on file  Tobacco Use   Smoking status: Former    Types: Cigarettes    Quit date: 08/04/1987    Years since quitting: 35.1   Smokeless tobacco: Never  Vaping Use   Vaping Use: Never used  Substance and Sexual Activity   Alcohol use: Yes    Alcohol/week: 14.0 standard drinks of alcohol    Types: 14 Glasses of wine per week   Drug use: No   Sexual activity: Yes    Partners: Male    Birth control/protection: Post-menopausal  Other Topics Concern   Not on file  Social History Narrative   Not on file   Social Determinants of Health   Financial Resource Strain: Not on file  Food Insecurity: Not on file  Transportation Needs: Not on file  Physical Activity: Not on file  Stress: Not on file  Social Connections: Not on file  Intimate Partner Violence: Not on file    Review of Systems  All other systems reviewed and are negative.   PHYSICAL EXAMINATION:    BP 108/62   Pulse 72   Resp 13   Ht 5\' 7"  (1.702 m)   Wt 141 lb (64 kg)   LMP 12/29/2013   BMI 22.08 kg/m     General appearance: alert, cooperative and appears stated age   Pelvic: External genitalia:  no lesions              Urethra:  normal appearing urethra with no masses, tenderness or lesions              Bartholins and Skenes: normal                 Vagina: the pessary was removed and cleaned. No  vaginal irritation. The pessary was reinserted.  Cervix: no lesions               Chaperone was present for exam.  1. Pessary maintenance Doing well, f/u in 3 months

## 2022-12-15 NOTE — Progress Notes (Signed)
GYNECOLOGY  VISIT   HPI: 67 y.o.   Married  Caucasian  female   G1P1001 with Patient's last menstrual period was 12/29/2013.   here for   3 mo pessary check.  Using a #4 ring with support for grade 2 uterine prolapse and grade 2 rectocele.  Takes pessary out once a week.  No bleeding, pain or unusual discharge.   No urinary incontinence.  Voiding well.  No real constipation.  Some accidental leakage of stool, but improved since making dietary changes.  Hx colostomy.   Saw her PCP at [facility] regarding this.  Using Benefiber.  Colonoscopy last week.   Uses estradiol cream once a week externally.  Does not need refills.   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    GYNECOLOGIC HISTORY: Patient's last menstrual period was 12/29/2013. Contraception:  PMP Menopausal hormone therapy:  estrace Last mammogram:  05/20/22 Breast Density Cat C, BI-RADS CAT 1 neg Last pap smear:   05/13/21 neg: HR HPV neg, 03/10/18 neg: HR HPV neg        OB History     Gravida  1   Para  1   Term  1   Preterm  0   AB  0   Living  1      SAB      IAB      Ectopic      Multiple      Live Births  1              Patient Active Problem List   Diagnosis Date Noted   Anosmia 04/14/2022   Nasal polyps 04/14/2022   Chronic pansinusitis 04/14/2022   Allergic rhinitis 07/18/2020   Chronic maxillary sinusitis 07/18/2020   Hypertensive retinopathy 07/18/2020   Incontinence of feces 07/18/2020   Mild intermittent asthma 07/18/2020   Pure hypercholesterolemia 07/18/2020   Rectocele 07/18/2020   Thyroid nodule 07/18/2020   Tinnitus 07/18/2020   Uterine prolapse 07/18/2020   Snoring 06/30/2016   Multinodular goiter 02/19/2015   Fibroid, uterine 11/24/2013   Menorrhagia, premenopausal 11/24/2013   Personal history of colonic polyps 04/23/2011   Diverticulitis of colon with perforation 04/20/2011   Sjoegren syndrome 04/17/2011   Asthma 04/17/2011    Past Medical History:  Diagnosis  Date   Arthritis    Asthma    Blood transfusion    AS INFANT   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   Diverticulitis of colon with perforation 04/20/2011   Heart murmur    Medical history non-contributory    Multinodular goiter    Osteopenia    Palpitations    CARDIAC STUDIES DONE 2009 - WAS TOLD THEY WERE NORMAL   Personal history of colonic polyps 04/23/2011   Rash, skin    AROUND STOMA   Sjogren's syndrome (HCC)     Past Surgical History:  Procedure Laterality Date   COLON RESECTION  04/22/2011   Procedure: COLON RESECTION;  Surgeon: [name], MD;  Location: [facility];  Service: General;  Laterality: N/A;  colon resection, drainage of abscess   COLON SURGERY  04/2011,07/2011   colostomy take down   COLOSTOMY  04/22/2011   Procedure: COLOSTOMY;  Surgeon: [name], MD;  Location: [facility];  Service: General;  Laterality: N/A;   DILATATION & CURETTAGE/HYSTEROSCOPY WITH TRUECLEAR N/A 12/19/2013   Procedure: DILATATION & CURETTAGE/HYSTEROSCOPY WITH TRUCLEAR;  Surgeon: Bennye Alm, MD;  Location: [facility];  Service: Gynecology;  Laterality: N/A;   FOREIGN REMOVED  AGE  7   PEANUT REMOVED FROM LUNG   HERNIA REPAIR     KNEE MASS EXCISION  1970'S   OSTEOCHONDROMA REMOVED   myoma on right thigh  2002 (summer)   PROCTOSCOPY  08/14/2011   Procedure: PROCTOSCOPY;  Surgeon: [name], MD;  Location: [facility];  Service: General;  Laterality: N/A;  rigid    Current Outpatient Medications  Medication Sig Dispense Refill   albuterol (VENTOLIN HFA) 108 (90 Base) MCG/ACT inhaler 2 puffs     alendronate (FOSAMAX) 70 MG tablet Take 70 mg by mouth once a week.     budesonide (PULMICORT) 0.5 MG/2ML nebulizer solution Take by nebulization.     calcium carbonate (OSCAL) 1500 (600 Ca) MG TABS tablet 1 tablet with meals     Cholecalciferol (VITAMIN D) 2000 UNITS tablet Take 1,000 Units by mouth daily.     estradiol (ESTRACE) 0.1 MG/GM vaginal cream Apply a pea sized amount just inside  the vaginally opening qd for one week then 3 days a week. 42.5 g 1   methylcellulose (ARTIFICIAL TEARS) 1 % ophthalmic solution Place 1 drop into both eyes daily.     Multiple Vitamins-Minerals (MULTIVITAMINS THER. W/MINERALS) TABS Take 1 tablet by mouth daily.     psyllium (METAMUCIL) 58.6 % packet Take 1 packet by mouth daily.     PULMICORT FLEXHALER 180 MCG/ACT inhaler      No current facility-administered medications for this visit.     ALLERGIES: Clindamycin, Codeine, and Hydroxychloroquine  Family History  Problem Relation Age of Onset   Hyperlipidemia Mother    Hyperlipidemia Father    Parkinson's disease Father    Hyperlipidemia Sister    Hyperlipidemia Brother    Thyroid disease Neg Hx    Breast cancer Neg Hx     Social History   Socioeconomic History   Marital status: Married    Spouse name: Not on file   Number of children: Not on file   Years of education: Not on file   Highest education level: Not on file  Occupational History   Not on file  Tobacco Use   Smoking status: Former    Current packs/day: 0.00    Types: Cigarettes    Quit date: 08/04/1987    Years since quitting: 35.4   Smokeless tobacco: Never  Vaping Use   Vaping status: Never Used  Substance and Sexual Activity   Alcohol use: Yes    Alcohol/week: 14.0 standard drinks of alcohol    Types: 14 Glasses of wine per week   Drug use: No   Sexual activity: Yes    Partners: Male    Birth control/protection: Post-menopausal  Other Topics Concern   Not on file  Social History Narrative   Not on file   Social Determinants of Health   Financial Resource Strain: Not on file  Food Insecurity: Not on file  Transportation Needs: Not on file  Physical Activity: Not on file  Stress: Not on file  Social Connections: Not on file  Intimate Partner Violence: Not on file    Review of Systems  All other systems reviewed and are negative.   PHYSICAL EXAMINATION:    BP 110/68 (BP Location: Left  Arm, Patient Position: Sitting, Cuff Size: Normal)   Pulse 78   Ht 5\' 7"  (1.702 m)   Wt 134 lb (60.8 kg)   LMP 12/29/2013   SpO2 98%   BMI 20.99 kg/m     General appearance: alert, cooperative and appears stated age  Pelvic: External genitalia:  no lesions              Urethra:  normal appearing urethra with no masses, tenderness or lesions              Bartholins and Skenes: normal                 Vagina: normal appearing vagina with normal color and discharge, no lesions.  First degree uterine prolapse without Valsalva.              Cervix: no lesions                Bimanual Exam:  Uterus:  normal size, contour, position, consistency, mobility, non-tender              Adnexa: no mass, fullness, tenderness       Chaperone was present for exam:  Warren Lacy, CMA  ASSESSMENT  Incomplete uterovaginal prolapse. Pessary maintenance.  Fecal incontinence.  Using Benefiber.   PLAN  Continue pessary use.  We reviewed using vaginal estrogen cream externally as well as internally with her pessary removal once a week.  I educated her to the possibility of pelvic floor therapy for improving rectal control.  FU for annual exam in 6 months.    An After Visit Summary was printed and given to the patient.  21 min  total time was spent for this patient encounter, including preparation, face-to-face counseling with the patient, coordination of care, and documentation of the encounter.

## 2022-12-21 ENCOUNTER — Ambulatory Visit: Payer: BC Managed Care – PPO | Admitting: Obstetrics and Gynecology

## 2022-12-21 ENCOUNTER — Encounter: Payer: Self-pay | Admitting: Obstetrics and Gynecology

## 2022-12-21 VITALS — BP 110/68 | HR 78 | Ht 67.0 in | Wt 134.0 lb

## 2022-12-21 DIAGNOSIS — Z4689 Encounter for fitting and adjustment of other specified devices: Secondary | ICD-10-CM | POA: Diagnosis not present

## 2022-12-21 DIAGNOSIS — N812 Incomplete uterovaginal prolapse: Secondary | ICD-10-CM

## 2022-12-21 DIAGNOSIS — R159 Full incontinence of feces: Secondary | ICD-10-CM

## 2023-02-08 ENCOUNTER — Other Ambulatory Visit (HOSPITAL_COMMUNITY): Payer: Self-pay | Admitting: Family Medicine

## 2023-02-08 DIAGNOSIS — E785 Hyperlipidemia, unspecified: Secondary | ICD-10-CM

## 2023-02-18 ENCOUNTER — Ambulatory Visit (HOSPITAL_COMMUNITY)
Admission: RE | Admit: 2023-02-18 | Discharge: 2023-02-18 | Disposition: A | Discharge status: Home / Self Care | Payer: Self-pay | Source: Ambulatory Visit | Attending: Family Medicine | Admitting: Family Medicine

## 2023-02-18 ENCOUNTER — Ambulatory Visit (HOSPITAL_COMMUNITY): Payer: BC Managed Care – PPO

## 2023-02-18 DIAGNOSIS — E785 Hyperlipidemia, unspecified: Secondary | ICD-10-CM | POA: Insufficient documentation

## 2023-04-07 ENCOUNTER — Other Ambulatory Visit: Payer: Self-pay | Admitting: Family Medicine

## 2023-04-07 DIAGNOSIS — Z0000 Encounter for general adult medical examination without abnormal findings: Secondary | ICD-10-CM

## 2023-05-27 ENCOUNTER — Ambulatory Visit
Admission: RE | Admit: 2023-05-27 | Discharge: 2023-05-27 | Disposition: A | Discharge status: Home / Self Care | Payer: BC Managed Care – PPO | Source: Ambulatory Visit | Attending: Family Medicine | Admitting: Family Medicine

## 2023-05-27 DIAGNOSIS — Z0000 Encounter for general adult medical examination without abnormal findings: Secondary | ICD-10-CM

## 2023-06-08 NOTE — Progress Notes (Deleted)
 68 y.o. G100P1001 Married Caucasian female here for annual exam.    PCP: Shon Hale, MD   Patient's last menstrual period was 12/29/2013.           Sexually active: Yes.    The current method of family planning is post menopausal status.    Menopausal hormone therapy:  estrace Exercising: {yes no:314532}  {types:19826} Smoker:  former  OB History  Gravida Para Term Preterm AB Living  1 1 1  0 0 1  SAB IAB Ectopic Multiple Live Births      1    # Outcome Date GA Lbr Len/2nd Weight Sex Type Anes PTL Lv  1 Term      Vag-Spont   LIV     HEALTH MAINTENANCE: Last 2 paps:  05/13/21 WNL, neg HPV; 03/10/2018 WNL NEG HPV; 01-24-15 WNL NEG HR HPV   History of abnormal Pap or positive HPV:  yes, many years ago Mammogram:   05/27/23 Breast Density Cat C, BI-RADS CAT 1 neg Colonoscopy:  09/24/11 Bone Density:  08/27/22  Result  osteopenia   Immunization History  Administered Date(s) Administered   Influenza Split 05/08/2005, 02/22/2012, 03/20/2014, 02/17/2017, 03/02/2017, 03/06/2019   Influenza, Quadrivalent, Recombinant, Inj, Pf 03/05/2019   Moderna Sars-Covid-2 Vaccination 08/04/2019, 09/01/2019   PFIZER(Purple Top)SARS-COV-2 Vaccination 03/29/2020, 09/16/2020   Pneumococcal Polysaccharide-23 10/01/2011   Tdap 07/16/2006, 11/26/2016   Zoster, Live 01/03/2018, 04/07/2018      reports that she quit smoking about 35 years ago. Her smoking use included cigarettes. She has never used smokeless tobacco. She reports current alcohol use of about 14.0 standard drinks of alcohol per week. She reports that she does not use drugs.  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   Diverticulitis of colon with perforation 04/20/2011   Heart murmur    Medical history non-contributory    Multinodular goiter    Osteopenia    Palpitations    CARDIAC STUDIES DONE 2009 - WAS TOLD THEY WERE NORMAL    Personal history of colonic polyps 04/23/2011   Rash, skin    AROUND STOMA   Sjogren's syndrome (HCC)     Past Surgical History:  Procedure Laterality Date   COLON RESECTION  04/22/2011   Procedure: COLON RESECTION;  Surgeon: [name], MD;  [facility]: [facility];  Service: General;  Laterality: N/A;  colon resection, drainage of abscess   COLON SURGERY  04/2011,07/2011   colostomy take down   COLOSTOMY  04/22/2011   Procedure: COLOSTOMY;  Surgeon: [name], MD;  [facility];  Service: General;  Laterality: N/A;   DILATATION & CURETTAGE/HYSTEROSCOPY WITH TRUECLEAR N/A 12/19/2013   Procedure: DILATATION & CURETTAGE/HYSTEROSCOPY WITH TRUCLEAR;  Surgeon: Bennye Alm, MD;  Location: [facility];  Service: Gynecology;  Laterality: N/A;   FOREIGN REMOVED  AGE 41   PEANUT REMOVED FROM LUNG   HERNIA REPAIR     KNEE MASS EXCISION  1970'S   OSTEOCHONDROMA REMOVED   myoma on right thigh  2002 (summer)   PROCTOSCOPY  08/14/2011   Procedure: PROCTOSCOPY;  Surgeon: [name], MD;  Location: [facility];  Service: General;  Laterality: N/A;  rigid    Current Outpatient Medications  Medication Sig Dispense Refill   albuterol (VENTOLIN HFA) 108 (90 Base) MCG/ACT inhaler 2 puffs     alendronate (FOSAMAX) 70 MG tablet Take 70  mg by mouth once a week.     budesonide (PULMICORT) 0.5 MG/2ML nebulizer solution Take by nebulization.     calcium carbonate (OSCAL) 1500 (600 Ca) MG TABS tablet 1 tablet with meals     Cholecalciferol (VITAMIN D) 2000 UNITS tablet Take 1,000 Units by mouth daily.     estradiol (ESTRACE) 0.1 MG/GM vaginal cream Apply a pea sized amount just inside the vaginally opening qd for one week then 3 days a week. 42.5 g 1   methylcellulose (ARTIFICIAL TEARS) 1 % ophthalmic solution Place 1 drop into both eyes daily.     Multiple Vitamins-Minerals (MULTIVITAMINS THER. W/MINERALS) TABS Take 1 tablet by mouth daily.     psyllium (METAMUCIL) 58.6 % packet Take 1 packet by mouth  daily.     PULMICORT FLEXHALER 180 MCG/ACT inhaler      No current facility-administered medications for this visit.    ALLERGIES: Clindamycin, Codeine, and Hydroxychloroquine  Family History  Problem Relation Age of Onset   Hyperlipidemia Mother    Hyperlipidemia Father    Parkinson's disease Father    Hyperlipidemia Sister    Hyperlipidemia Brother    Thyroid disease Neg Hx    Breast cancer Neg Hx     Review of Systems  PHYSICAL EXAM:  LMP 12/29/2013     General appearance: alert, cooperative and appears stated age Head: normocephalic, without obvious abnormality, atraumatic Neck: no adenopathy, supple, symmetrical, trachea midline and thyroid normal to inspection and palpation Lungs: clear to auscultation bilaterally Breasts: normal appearance, no masses or tenderness, No nipple retraction or dimpling, No nipple discharge or bleeding, No axillary adenopathy Heart: regular rate and rhythm Abdomen: soft, non-tender; no masses, no organomegaly Extremities: extremities normal, atraumatic, no cyanosis or edema Skin: skin color, texture, turgor normal. No rashes or lesions Lymph nodes: cervical, supraclavicular, and axillary nodes normal. Neurologic: grossly normal  Pelvic: External genitalia:  no lesions              No abnormal inguinal nodes palpated.              Urethra:  normal appearing urethra with no masses, tenderness or lesions              Bartholins and Skenes: normal                 Vagina: normal appearing vagina with normal color and discharge, no lesions              Cervix: no lesions              Pap taken: {yes no:314532} Bimanual Exam:  Uterus:  normal size, contour, position, consistency, mobility, non-tender              Adnexa: no mass, fullness, tenderness              Rectal exam: {yes no:314532}.  Confirms.              Anus:  normal sphincter tone, no lesions  Chaperone was present for exam:  {BSCHAPERONE:31226::"Gleen Ripberger F,  CMA"}  ASSESSMENT: Well woman visit with gynecologic exam  ***  PLAN: Mammogram screening discussed. Self breast awareness reviewed. Pap and HRV collected:  {yes no:314532} Guidelines for Calcium, Vitamin D, regular exercise program including cardiovascular and weight bearing exercise. Medication refills:  *** {LABS (Optional):23779} Follow up:  ***    Additional counseling given.  {yes [id number]. ***  total time was spent for this patient encounter, including preparation, face-to-face counseling with  the patient, coordination of care, and documentation of the encounter in addition to doing the well woman visit with gynecologic exam.

## 2023-06-22 ENCOUNTER — Ambulatory Visit: Payer: BC Managed Care – PPO | Admitting: Obstetrics and Gynecology

## 2023-07-30 ENCOUNTER — Other Ambulatory Visit: Payer: Self-pay | Admitting: Obstetrics and Gynecology

## 2023-07-30 DIAGNOSIS — N952 Postmenopausal atrophic vaginitis: Secondary | ICD-10-CM

## 2023-07-30 DIAGNOSIS — N898 Other specified noninflammatory disorders of vagina: Secondary | ICD-10-CM

## 2023-07-30 NOTE — Telephone Encounter (Signed)
 Med refill request: estradiol 0.1 mg vaginal cream Last OV: 12/21/22 for pessary check Last AEX: 06/18/22 Next AEX: 09/29/23 Last MMG (if hormonal med) 05/27/23 BI-RADS 1 negative Refill authorized: estradiol 0.1 mg vaginal cream 42.5 grams.  Sent to provider for approval or denial as appropriate.

## 2023-09-29 ENCOUNTER — Encounter: Payer: Self-pay | Admitting: Obstetrics and Gynecology

## 2023-09-29 ENCOUNTER — Ambulatory Visit (INDEPENDENT_AMBULATORY_CARE_PROVIDER_SITE_OTHER): Payer: BC Managed Care – PPO | Admitting: Obstetrics and Gynecology

## 2023-09-29 VITALS — BP 128/64 | HR 74 | Ht 68.0 in | Wt 141.0 lb

## 2023-09-29 DIAGNOSIS — Z5181 Encounter for therapeutic drug level monitoring: Secondary | ICD-10-CM | POA: Diagnosis not present

## 2023-09-29 DIAGNOSIS — Z4689 Encounter for fitting and adjustment of other specified devices: Secondary | ICD-10-CM

## 2023-09-29 DIAGNOSIS — N952 Postmenopausal atrophic vaginitis: Secondary | ICD-10-CM

## 2023-09-29 DIAGNOSIS — Z1331 Encounter for screening for depression: Secondary | ICD-10-CM | POA: Diagnosis not present

## 2023-09-29 DIAGNOSIS — Z01419 Encounter for gynecological examination (general) (routine) without abnormal findings: Secondary | ICD-10-CM | POA: Diagnosis not present

## 2023-09-29 MED ORDER — ESTRADIOL 0.1 MG/GM VA CREA
TOPICAL_CREAM | VAGINAL | 1 refills | Status: AC
Start: 2023-09-29 — End: ?

## 2023-09-29 NOTE — Progress Notes (Signed)
 68 y.o. G46P1001 Married Caucasian female here for annual exam.  She is still using her pessary. She removes it once a week and cleans it.  Pessary is on place.   Has uterine prolapse, treated with the pessary.  A little bit of urinary incontinence with shift in position, such as getting up off toilet.  Bowel movements are ok.  Has occasional accidental leakage of stool. Hx bowel resection for rupture from diverticulitis.  Has a cold today.  Wearing a mask.   Has a skin area on her back she wants checked.   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  Will start phased retirement.  1 child, in [place] .  Living here for 31 years.   PCP: Ransom Byers, MD   Patient's last menstrual period was 12/29/2013.           Sexually active: Yes.    The current method of family planning is postmenopausal.  Menopausal hormone therapy:  estrace  cream  Exercising: Yes.     Walking   stretching  Smoker:  no  OB History  Gravida Para Term Preterm AB Living  1 1 1  0 0 1  SAB IAB Ectopic Multiple Live Births      1    # Outcome Date GA Lbr Len/2nd Weight Sex Type Anes PTL Lv  1 Term      Vag-Spont   LIV     HEALTH MAINTENANCE: Last 2 paps:  05/13/21 WNL, neg HPV; 03/10/2018 WNL NEG HPV.   History of abnormal Pap or positive HPV:  yes many years ago in her teens  Mammogram:   05/27/23 Density C Bi-rads 1 neg  Colonoscopy:  2013, 2024. [facility] Bone Density:  08/27/22  Result  Osteoporotic - spine, osteopenia hip.  Takes Fosamax through PCP.   Immunization History  Administered Date(s) Administered   Influenza Split 05/08/2005, 02/22/2012, 03/20/2014, 02/17/2017, 03/02/2017, 03/06/2019   Influenza, Quadrivalent, Recombinant, Inj, Pf 03/05/2019   Moderna Sars-Covid-2 Vaccination 08/04/2019, 09/01/2019   PFIZER(Purple Top)SARS-COV-2 Vaccination 03/29/2020, 09/16/2020   Pneumococcal Polysaccharide-23 10/01/2011   Tdap 07/16/2006, 11/26/2016   Zoster, Live 01/03/2018, 04/07/2018       reports that she quit smoking about 36 years ago. Her smoking use included cigarettes. She has never used smokeless tobacco. She reports current alcohol  use of about 14.0 standard drinks of alcohol  per week. She reports that she does not use drugs.  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   Diverticulitis of colon with perforation 04/20/2011   Heart murmur    Medical history non-contributory    Multinodular goiter    Osteopenia    Palpitations    CARDIAC STUDIES DONE 2009 - WAS TOLD THEY WERE NORMAL   Personal history of colonic polyps 04/23/2011   Rash, skin    AROUND STOMA   Sjogren's syndrome (HCC)     Past Surgical History:  Procedure Laterality Date   COLON RESECTION  04/22/2011   Procedure: COLON RESECTION;  Surgeon: Levert Ready, MD;  [facility]: [facility];  Service: General;  Laterality: N/A;  colon resection, drainage of abscess   COLON SURGERY  04/2011,07/2011   colostomy take down   COLOSTOMY  04/22/2011   Procedure: COLOSTOMY;  Surgeon: Levert Ready, MD;  [facility];  Service: General;  Laterality: N/A;   DILATATION & CURETTAGE/HYSTEROSCOPY WITH TRUECLEAR N/A 12/19/2013  Procedure: DILATATION & CURETTAGE/HYSTEROSCOPY WITH TRUCLEAR;  Surgeon: Laurent Pontes, MD;  Location: [facility];  Service: Gynecology;  Laterality: N/A;   FOREIGN REMOVED  AGE 52   PEANUT REMOVED FROM LUNG   HERNIA REPAIR     KNEE MASS EXCISION  1970'S   OSTEOCHONDROMA REMOVED   myoma on right thigh  2002 (summer)   PROCTOSCOPY  08/14/2011   Procedure: PROCTOSCOPY;  Surgeon: Levert Ready, MD;  [facility];  Service: General;  Laterality: N/A;  rigid    Current Outpatient Medications  Medication Sig Dispense Refill   albuterol (VENTOLIN HFA) 108 (90 Base) MCG/ACT inhaler 2 puffs     alendronate (FOSAMAX) 70 MG tablet Take 70 mg by mouth once a week.     budesonide (PULMICORT) 0.5 MG/2ML  nebulizer solution Take by nebulization.     calcium carbonate (OSCAL) 1500 (600 Ca) MG TABS tablet 1 tablet with meals     Cholecalciferol (VITAMIN D) 2000 UNITS tablet Take 1,000 Units by mouth daily.     estradiol  (ESTRACE ) 0.1 MG/GM vaginal cream APPLY A PEA SIZED AMOUNT JUST INSIDE THE VAGINAL OPENING 3 DAYS A WEEK. 42.5 g 0   methylcellulose (ARTIFICIAL TEARS) 1 % ophthalmic solution Place 1 drop into both eyes daily.     Multiple Vitamins-Minerals (MULTIVITAMINS THER. W/MINERALS) TABS Take 1 tablet by mouth daily.     psyllium (METAMUCIL) 58.6 % packet Take 1 packet by mouth daily.     PULMICORT FLEXHALER 180 MCG/ACT inhaler      No current facility-administered medications for this visit.    ALLERGIES: Clindamycin, Codeine, and Hydroxychloroquine  Family History  Problem Relation Age of Onset   Hyperlipidemia Mother    Hyperlipidemia Father    Parkinson's disease Father    Hyperlipidemia Sister    Hyperlipidemia Brother    Thyroid  disease Neg Hx    Breast cancer Neg Hx     Review of Systems  All other systems reviewed and are negative.   PHYSICAL EXAM:  BP 128/64   Pulse 74   Ht 5\' 8"  (1.727 m)   Wt 141 lb (64 kg)   LMP 12/29/2013   SpO2 95%   BMI 21.44 kg/m     General appearance: alert, cooperative and appears stated age Head: normocephalic, without obvious abnormality, atraumatic Neck: no adenopathy, supple, symmetrical, trachea midline and thyroid  normal to inspection and palpation Lungs: rales and rhonchi in all lobes of the lung.  Breasts: normal appearance, no masses or tenderness, No nipple retraction or dimpling, No nipple discharge or bleeding, No axillary adenopathy Heart: regular rate and rhythm Abdomen: soft, non-tender; no masses, no organomegaly Extremities: extremities normal, atraumatic, no cyanosis or edema Skin: skin color, texture, turgor normal. No rashes or lesions.  1 cm raised grey/brown scaly nevus of central back.  Lymph nodes:  cervical, supraclavicular, and axillary nodes normal. Neurologic: grossly normal  Pelvic: External genitalia:  no lesions              No abnormal inguinal nodes palpated.              Urethra:  normal appearing urethra with no masses, tenderness or lesions              Bartholins and Skenes: normal                 Vagina: normal appearing vagina with normal color and discharge, no lesions  Cervix: no lesions              Pap taken: no Bimanual Exam:  Uterus:  normal size, contour, position, consistency, mobility, non-tender              Adnexa: no mass, fullness, tenderness              Rectal exam: yes.  Confirms.              Anus:  normal sphincter tone, no lesions  #4 pessary ring with support removed, cleansed, and replaced.  Chaperone was present for exam:  Chrystie Crass, CMA  ASSESSMENT: Well woman visit with gynecologic exam. Uterovaginal prolapse.  Grade 2 uterine prolapse and grade 2 rectocele  Pessary maintenance.  Occasional accidental leakage of stool. Vaginal atrophy. Using vaginal estradiol  cream.  Osteoporosis.  On Fosamax through PCP.   PHQ-9: 0 Hx diverticulitis and colon perforation.  Hx colostomy and reversal.  Respiratory infection.  Asthma. Seborrheic keratosis.   PLAN: Mammogram screening recommended yearly.  Self breast awareness reviewed. Pap and HRV collected:  no.  Due in 2027.  Guidelines for Calcium, Vitamin D, regular exercise program including cardiovascular and weight bearing exercise. Ok to continue pessary use.  Medication refills:  Estradiol  cream 1/2 gram pv at hs 3 times per week.  I discussed the potential effect on breast cancer.  Try Metamucil for reducing fecal incontinence.  I also discussed potential pelvic floor therapy.  Not interested in referral at this time.  Routine labs with PCP.  BMD in 2026.  See PCP regarding respiratory infection.  Establish care with dermatology for skin checks. Follow up:  yearly and prn.

## 2023-09-29 NOTE — Patient Instructions (Signed)

## 2024-03-01 DIAGNOSIS — M25562 Pain in left knee: Secondary | ICD-10-CM | POA: Diagnosis not present

## 2024-03-13 DIAGNOSIS — M25662 Stiffness of left knee, not elsewhere classified: Secondary | ICD-10-CM | POA: Diagnosis not present

## 2024-03-13 DIAGNOSIS — M25562 Pain in left knee: Secondary | ICD-10-CM | POA: Diagnosis not present

## 2024-03-13 DIAGNOSIS — M6281 Muscle weakness (generalized): Secondary | ICD-10-CM | POA: Diagnosis not present

## 2024-03-13 DIAGNOSIS — M25462 Effusion, left knee: Secondary | ICD-10-CM | POA: Diagnosis not present

## 2024-03-16 DIAGNOSIS — M25662 Stiffness of left knee, not elsewhere classified: Secondary | ICD-10-CM | POA: Diagnosis not present

## 2024-03-16 DIAGNOSIS — M6281 Muscle weakness (generalized): Secondary | ICD-10-CM | POA: Diagnosis not present

## 2024-03-16 DIAGNOSIS — M25562 Pain in left knee: Secondary | ICD-10-CM | POA: Diagnosis not present

## 2024-03-16 DIAGNOSIS — M25462 Effusion, left knee: Secondary | ICD-10-CM | POA: Diagnosis not present

## 2024-03-22 DIAGNOSIS — M6281 Muscle weakness (generalized): Secondary | ICD-10-CM | POA: Diagnosis not present

## 2024-03-22 DIAGNOSIS — M25662 Stiffness of left knee, not elsewhere classified: Secondary | ICD-10-CM | POA: Diagnosis not present

## 2024-03-22 DIAGNOSIS — M25462 Effusion, left knee: Secondary | ICD-10-CM | POA: Diagnosis not present

## 2024-03-22 DIAGNOSIS — M25562 Pain in left knee: Secondary | ICD-10-CM | POA: Diagnosis not present

## 2024-03-24 DIAGNOSIS — M6281 Muscle weakness (generalized): Secondary | ICD-10-CM | POA: Diagnosis not present

## 2024-03-24 DIAGNOSIS — M25562 Pain in left knee: Secondary | ICD-10-CM | POA: Diagnosis not present

## 2024-03-24 DIAGNOSIS — M25462 Effusion, left knee: Secondary | ICD-10-CM | POA: Diagnosis not present

## 2024-03-24 DIAGNOSIS — M25662 Stiffness of left knee, not elsewhere classified: Secondary | ICD-10-CM | POA: Diagnosis not present

## 2024-03-27 DIAGNOSIS — M6281 Muscle weakness (generalized): Secondary | ICD-10-CM | POA: Diagnosis not present

## 2024-03-27 DIAGNOSIS — M25562 Pain in left knee: Secondary | ICD-10-CM | POA: Diagnosis not present

## 2024-03-27 DIAGNOSIS — M25462 Effusion, left knee: Secondary | ICD-10-CM | POA: Diagnosis not present

## 2024-03-27 DIAGNOSIS — M25662 Stiffness of left knee, not elsewhere classified: Secondary | ICD-10-CM | POA: Diagnosis not present

## 2024-03-30 DIAGNOSIS — M25662 Stiffness of left knee, not elsewhere classified: Secondary | ICD-10-CM | POA: Diagnosis not present

## 2024-03-30 DIAGNOSIS — M25462 Effusion, left knee: Secondary | ICD-10-CM | POA: Diagnosis not present

## 2024-03-30 DIAGNOSIS — M6281 Muscle weakness (generalized): Secondary | ICD-10-CM | POA: Diagnosis not present

## 2024-03-30 DIAGNOSIS — M25562 Pain in left knee: Secondary | ICD-10-CM | POA: Diagnosis not present

## 2024-04-03 DIAGNOSIS — M25562 Pain in left knee: Secondary | ICD-10-CM | POA: Diagnosis not present

## 2024-04-03 DIAGNOSIS — M25662 Stiffness of left knee, not elsewhere classified: Secondary | ICD-10-CM | POA: Diagnosis not present

## 2024-04-03 DIAGNOSIS — M6281 Muscle weakness (generalized): Secondary | ICD-10-CM | POA: Diagnosis not present

## 2024-04-03 DIAGNOSIS — M25462 Effusion, left knee: Secondary | ICD-10-CM | POA: Diagnosis not present

## 2024-04-06 DIAGNOSIS — M25562 Pain in left knee: Secondary | ICD-10-CM | POA: Diagnosis not present

## 2024-04-06 DIAGNOSIS — M6281 Muscle weakness (generalized): Secondary | ICD-10-CM | POA: Diagnosis not present

## 2024-04-06 DIAGNOSIS — M25462 Effusion, left knee: Secondary | ICD-10-CM | POA: Diagnosis not present

## 2024-04-06 DIAGNOSIS — M25662 Stiffness of left knee, not elsewhere classified: Secondary | ICD-10-CM | POA: Diagnosis not present

## 2024-04-10 DIAGNOSIS — M25562 Pain in left knee: Secondary | ICD-10-CM | POA: Diagnosis not present

## 2024-04-10 DIAGNOSIS — M6281 Muscle weakness (generalized): Secondary | ICD-10-CM | POA: Diagnosis not present

## 2024-04-10 DIAGNOSIS — M25462 Effusion, left knee: Secondary | ICD-10-CM | POA: Diagnosis not present

## 2024-04-10 DIAGNOSIS — M25662 Stiffness of left knee, not elsewhere classified: Secondary | ICD-10-CM | POA: Diagnosis not present

## 2024-04-12 ENCOUNTER — Other Ambulatory Visit: Payer: Self-pay | Admitting: Family Medicine

## 2024-04-12 DIAGNOSIS — Z1231 Encounter for screening mammogram for malignant neoplasm of breast: Secondary | ICD-10-CM

## 2024-04-13 DIAGNOSIS — M25662 Stiffness of left knee, not elsewhere classified: Secondary | ICD-10-CM | POA: Diagnosis not present

## 2024-04-13 DIAGNOSIS — M6281 Muscle weakness (generalized): Secondary | ICD-10-CM | POA: Diagnosis not present

## 2024-04-13 DIAGNOSIS — M25462 Effusion, left knee: Secondary | ICD-10-CM | POA: Diagnosis not present

## 2024-04-18 DIAGNOSIS — M6281 Muscle weakness (generalized): Secondary | ICD-10-CM | POA: Diagnosis not present

## 2024-04-18 DIAGNOSIS — M25462 Effusion, left knee: Secondary | ICD-10-CM | POA: Diagnosis not present

## 2024-04-18 DIAGNOSIS — M25562 Pain in left knee: Secondary | ICD-10-CM | POA: Diagnosis not present

## 2024-04-18 DIAGNOSIS — M25662 Stiffness of left knee, not elsewhere classified: Secondary | ICD-10-CM | POA: Diagnosis not present

## 2024-05-10 DIAGNOSIS — M25562 Pain in left knee: Secondary | ICD-10-CM | POA: Diagnosis not present

## 2024-05-29 ENCOUNTER — Ambulatory Visit
Admission: RE | Admit: 2024-05-29 | Discharge: 2024-05-29 | Disposition: A | Source: Ambulatory Visit | Attending: Family Medicine

## 2024-05-29 DIAGNOSIS — Z1231 Encounter for screening mammogram for malignant neoplasm of breast: Secondary | ICD-10-CM

## 2024-10-04 ENCOUNTER — Ambulatory Visit: Admitting: Obstetrics and Gynecology
